# Patient Record
Sex: Female | Born: 1980 | Race: White | Hispanic: No | Marital: Single | Smoking: Former smoker
Health system: Southern US, Community
[De-identification: ages and names within clinical notes are randomized; demographics above are authoritative.]

## PROBLEM LIST (undated history)

## (undated) DIAGNOSIS — A749 Chlamydial infection, unspecified: Secondary | ICD-10-CM

## (undated) DIAGNOSIS — F32A Depression, unspecified: Secondary | ICD-10-CM

## (undated) DIAGNOSIS — R079 Chest pain, unspecified: Secondary | ICD-10-CM

## (undated) DIAGNOSIS — F419 Anxiety disorder, unspecified: Secondary | ICD-10-CM

## (undated) DIAGNOSIS — F329 Major depressive disorder, single episode, unspecified: Secondary | ICD-10-CM

## (undated) DIAGNOSIS — F909 Attention-deficit hyperactivity disorder, unspecified type: Secondary | ICD-10-CM

## (undated) DIAGNOSIS — D649 Anemia, unspecified: Secondary | ICD-10-CM

## (undated) DIAGNOSIS — J45909 Unspecified asthma, uncomplicated: Secondary | ICD-10-CM

## (undated) DIAGNOSIS — R519 Headache, unspecified: Secondary | ICD-10-CM

## (undated) DIAGNOSIS — I10 Essential (primary) hypertension: Secondary | ICD-10-CM

## (undated) DIAGNOSIS — R51 Headache: Secondary | ICD-10-CM

## (undated) HISTORY — DX: Headache, unspecified: R51.9

## (undated) HISTORY — PX: TONSILLECTOMY: SUR1361

## (undated) HISTORY — DX: Unspecified asthma, uncomplicated: J45.909

## (undated) HISTORY — DX: Depression, unspecified: F32.A

## (undated) HISTORY — DX: Headache: R51

## (undated) HISTORY — PX: LYMPHADENECTOMY: SHX5960

## (undated) HISTORY — PX: CERVICAL POLYPECTOMY: SHX88

## (undated) HISTORY — PX: CERVICAL BIOPSY  W/ LOOP ELECTRODE EXCISION: SUR135

## (undated) HISTORY — PX: ADENOIDECTOMY: SUR15

## (undated) HISTORY — DX: Anxiety disorder, unspecified: F41.9

## (undated) HISTORY — PX: APPENDECTOMY: SHX54

## (undated) HISTORY — PX: TUBAL LIGATION: SHX77

## (undated) HISTORY — DX: Major depressive disorder, single episode, unspecified: F32.9

## (undated) HISTORY — PX: DILATION AND CURETTAGE OF UTERUS: SHX78

---
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
## 1999-04-10 ENCOUNTER — Emergency Department (HOSPITAL_COMMUNITY): Admission: EM | Admit: 1999-04-10 | Discharge: 1999-04-10 | Payer: Self-pay | Admitting: Emergency Medicine

## 1999-04-11 ENCOUNTER — Emergency Department (HOSPITAL_COMMUNITY): Admission: EM | Admit: 1999-04-11 | Discharge: 1999-04-11 | Payer: Self-pay | Admitting: Emergency Medicine

## 1999-06-24 ENCOUNTER — Emergency Department (HOSPITAL_COMMUNITY): Admission: EM | Admit: 1999-06-24 | Discharge: 1999-06-24 | Payer: Self-pay | Admitting: Internal Medicine

## 1999-06-24 ENCOUNTER — Encounter: Payer: Self-pay | Admitting: Internal Medicine

## 1999-07-30 ENCOUNTER — Emergency Department (HOSPITAL_COMMUNITY): Admission: EM | Admit: 1999-07-30 | Discharge: 1999-07-30 | Payer: Self-pay | Admitting: Emergency Medicine

## 1999-09-20 ENCOUNTER — Emergency Department (HOSPITAL_COMMUNITY): Admission: EM | Admit: 1999-09-20 | Discharge: 1999-09-20 | Payer: Self-pay | Admitting: Emergency Medicine

## 1999-10-05 ENCOUNTER — Encounter: Payer: Self-pay | Admitting: Emergency Medicine

## 1999-10-05 ENCOUNTER — Emergency Department (HOSPITAL_COMMUNITY): Admission: EM | Admit: 1999-10-05 | Discharge: 1999-10-05 | Payer: Self-pay | Admitting: Emergency Medicine

## 2000-02-04 ENCOUNTER — Emergency Department (HOSPITAL_COMMUNITY): Admission: EM | Admit: 2000-02-04 | Discharge: 2000-02-04 | Payer: Self-pay | Admitting: Emergency Medicine

## 2000-02-07 ENCOUNTER — Emergency Department (HOSPITAL_COMMUNITY): Admission: EM | Admit: 2000-02-07 | Discharge: 2000-02-07 | Payer: Self-pay | Admitting: Emergency Medicine

## 2000-02-12 ENCOUNTER — Emergency Department (HOSPITAL_COMMUNITY): Admission: EM | Admit: 2000-02-12 | Discharge: 2000-02-13 | Payer: Self-pay | Admitting: Emergency Medicine

## 2000-06-09 ENCOUNTER — Emergency Department (HOSPITAL_COMMUNITY): Admission: EM | Admit: 2000-06-09 | Discharge: 2000-06-09 | Payer: Self-pay | Admitting: Emergency Medicine

## 2000-09-15 ENCOUNTER — Emergency Department (HOSPITAL_COMMUNITY): Admission: EM | Admit: 2000-09-15 | Discharge: 2000-09-15 | Payer: Self-pay | Admitting: Emergency Medicine

## 2000-11-24 ENCOUNTER — Inpatient Hospital Stay (HOSPITAL_COMMUNITY): Admission: AD | Admit: 2000-11-24 | Discharge: 2000-11-24 | Payer: Self-pay | Admitting: Obstetrics

## 2000-12-19 ENCOUNTER — Other Ambulatory Visit: Admission: RE | Admit: 2000-12-19 | Discharge: 2000-12-19 | Payer: Self-pay | Admitting: Obstetrics and Gynecology

## 2000-12-26 ENCOUNTER — Inpatient Hospital Stay (HOSPITAL_COMMUNITY): Admission: AD | Admit: 2000-12-26 | Discharge: 2000-12-28 | Payer: Self-pay | Admitting: Obstetrics & Gynecology

## 2000-12-26 ENCOUNTER — Encounter: Payer: Self-pay | Admitting: Obstetrics & Gynecology

## 2001-01-30 ENCOUNTER — Inpatient Hospital Stay (HOSPITAL_COMMUNITY): Admission: AD | Admit: 2001-01-30 | Discharge: 2001-01-30 | Payer: Self-pay | Admitting: Obstetrics and Gynecology

## 2001-02-10 ENCOUNTER — Inpatient Hospital Stay (HOSPITAL_COMMUNITY): Admission: AD | Admit: 2001-02-10 | Discharge: 2001-02-10 | Payer: Self-pay | Admitting: Obstetrics and Gynecology

## 2001-02-23 ENCOUNTER — Ambulatory Visit (HOSPITAL_COMMUNITY): Admission: RE | Admit: 2001-02-23 | Discharge: 2001-02-23 | Payer: Self-pay | Admitting: Obstetrics and Gynecology

## 2001-03-10 ENCOUNTER — Inpatient Hospital Stay (HOSPITAL_COMMUNITY): Admission: AD | Admit: 2001-03-10 | Discharge: 2001-03-10 | Payer: Self-pay | Admitting: Obstetrics & Gynecology

## 2001-03-25 ENCOUNTER — Other Ambulatory Visit: Admission: RE | Admit: 2001-03-25 | Discharge: 2001-03-25 | Payer: Self-pay | Admitting: Obstetrics and Gynecology

## 2001-03-28 ENCOUNTER — Inpatient Hospital Stay (HOSPITAL_COMMUNITY): Admission: AD | Admit: 2001-03-28 | Discharge: 2001-03-28 | Payer: Self-pay | Admitting: Obstetrics and Gynecology

## 2001-04-13 ENCOUNTER — Other Ambulatory Visit: Admission: RE | Admit: 2001-04-13 | Discharge: 2001-04-13 | Payer: Self-pay | Admitting: Obstetrics and Gynecology

## 2001-04-13 ENCOUNTER — Encounter (INDEPENDENT_AMBULATORY_CARE_PROVIDER_SITE_OTHER): Payer: Self-pay

## 2001-04-16 ENCOUNTER — Inpatient Hospital Stay (HOSPITAL_COMMUNITY): Admission: AD | Admit: 2001-04-16 | Discharge: 2001-04-16 | Payer: Self-pay | Admitting: Obstetrics and Gynecology

## 2001-04-18 ENCOUNTER — Inpatient Hospital Stay (HOSPITAL_COMMUNITY): Admission: AD | Admit: 2001-04-18 | Discharge: 2001-04-18 | Payer: Self-pay | Admitting: Obstetrics and Gynecology

## 2001-04-19 ENCOUNTER — Observation Stay (HOSPITAL_COMMUNITY): Admission: AD | Admit: 2001-04-19 | Discharge: 2001-04-19 | Payer: Self-pay | Admitting: Obstetrics and Gynecology

## 2001-04-25 ENCOUNTER — Inpatient Hospital Stay (HOSPITAL_COMMUNITY): Admission: AD | Admit: 2001-04-25 | Discharge: 2001-04-25 | Payer: Self-pay | Admitting: *Deleted

## 2001-05-03 ENCOUNTER — Inpatient Hospital Stay (HOSPITAL_COMMUNITY): Admission: AD | Admit: 2001-05-03 | Discharge: 2001-05-03 | Payer: Self-pay | Admitting: Obstetrics & Gynecology

## 2001-05-10 ENCOUNTER — Inpatient Hospital Stay (HOSPITAL_COMMUNITY): Admission: AD | Admit: 2001-05-10 | Discharge: 2001-05-10 | Payer: Self-pay | Admitting: Obstetrics & Gynecology

## 2001-05-26 ENCOUNTER — Inpatient Hospital Stay (HOSPITAL_COMMUNITY): Admission: AD | Admit: 2001-05-26 | Discharge: 2001-05-26 | Payer: Self-pay | Admitting: Obstetrics & Gynecology

## 2001-05-29 ENCOUNTER — Inpatient Hospital Stay (HOSPITAL_COMMUNITY): Admission: AD | Admit: 2001-05-29 | Discharge: 2001-06-02 | Payer: Self-pay | Admitting: Obstetrics and Gynecology

## 2001-06-30 ENCOUNTER — Other Ambulatory Visit: Admission: RE | Admit: 2001-06-30 | Discharge: 2001-06-30 | Payer: Self-pay | Admitting: Obstetrics and Gynecology

## 2001-08-12 ENCOUNTER — Ambulatory Visit (HOSPITAL_COMMUNITY): Admission: RE | Admit: 2001-08-12 | Discharge: 2001-08-12 | Payer: Self-pay | Admitting: Obstetrics and Gynecology

## 2001-08-12 ENCOUNTER — Encounter (INDEPENDENT_AMBULATORY_CARE_PROVIDER_SITE_OTHER): Payer: Self-pay

## 2001-09-12 ENCOUNTER — Emergency Department (HOSPITAL_COMMUNITY): Admission: EM | Admit: 2001-09-12 | Discharge: 2001-09-12 | Payer: Self-pay | Admitting: Emergency Medicine

## 2001-11-02 ENCOUNTER — Other Ambulatory Visit: Admission: RE | Admit: 2001-11-02 | Discharge: 2001-11-02 | Payer: Self-pay | Admitting: Obstetrics and Gynecology

## 2002-02-03 ENCOUNTER — Emergency Department (HOSPITAL_COMMUNITY): Admission: EM | Admit: 2002-02-03 | Discharge: 2002-02-04 | Payer: Self-pay | Admitting: Emergency Medicine

## 2002-02-03 ENCOUNTER — Encounter: Payer: Self-pay | Admitting: Emergency Medicine

## 2003-05-05 ENCOUNTER — Emergency Department (HOSPITAL_COMMUNITY): Admission: EM | Admit: 2003-05-05 | Discharge: 2003-05-05 | Payer: Self-pay | Admitting: Emergency Medicine

## 2003-07-05 ENCOUNTER — Emergency Department (HOSPITAL_COMMUNITY): Admission: EM | Admit: 2003-07-05 | Discharge: 2003-07-05 | Payer: Self-pay | Admitting: Emergency Medicine

## 2003-09-06 ENCOUNTER — Emergency Department (HOSPITAL_COMMUNITY): Admission: EM | Admit: 2003-09-06 | Discharge: 2003-09-07 | Payer: Self-pay | Admitting: Emergency Medicine

## 2003-09-07 ENCOUNTER — Encounter: Payer: Self-pay | Admitting: Emergency Medicine

## 2004-03-06 ENCOUNTER — Emergency Department (HOSPITAL_COMMUNITY): Admission: EM | Admit: 2004-03-06 | Discharge: 2004-03-06 | Payer: Self-pay | Admitting: Emergency Medicine

## 2004-03-09 ENCOUNTER — Emergency Department (HOSPITAL_COMMUNITY): Admission: EM | Admit: 2004-03-09 | Discharge: 2004-03-10 | Payer: Self-pay | Admitting: Emergency Medicine

## 2004-05-06 ENCOUNTER — Emergency Department (HOSPITAL_COMMUNITY): Admission: EM | Admit: 2004-05-06 | Discharge: 2004-05-07 | Payer: Self-pay | Admitting: Emergency Medicine

## 2004-05-07 ENCOUNTER — Emergency Department (HOSPITAL_COMMUNITY): Admission: EM | Admit: 2004-05-07 | Discharge: 2004-05-08 | Payer: Self-pay | Admitting: Emergency Medicine

## 2004-06-15 ENCOUNTER — Inpatient Hospital Stay (HOSPITAL_COMMUNITY): Admission: AD | Admit: 2004-06-15 | Discharge: 2004-06-15 | Payer: Self-pay | Admitting: Obstetrics and Gynecology

## 2004-07-09 ENCOUNTER — Other Ambulatory Visit: Admission: RE | Admit: 2004-07-09 | Discharge: 2004-07-09 | Payer: Self-pay | Admitting: Obstetrics and Gynecology

## 2004-07-26 ENCOUNTER — Inpatient Hospital Stay (HOSPITAL_COMMUNITY): Admission: AD | Admit: 2004-07-26 | Discharge: 2004-07-26 | Payer: Self-pay | Admitting: Obstetrics and Gynecology

## 2004-09-15 ENCOUNTER — Inpatient Hospital Stay (HOSPITAL_COMMUNITY): Admission: AD | Admit: 2004-09-15 | Discharge: 2004-09-15 | Payer: Self-pay | Admitting: Obstetrics and Gynecology

## 2004-09-29 ENCOUNTER — Inpatient Hospital Stay (HOSPITAL_COMMUNITY): Admission: AD | Admit: 2004-09-29 | Discharge: 2004-09-29 | Payer: Self-pay | Admitting: Obstetrics and Gynecology

## 2004-11-06 ENCOUNTER — Inpatient Hospital Stay (HOSPITAL_COMMUNITY): Admission: AD | Admit: 2004-11-06 | Discharge: 2004-11-06 | Payer: Self-pay | Admitting: Obstetrics and Gynecology

## 2004-11-22 ENCOUNTER — Inpatient Hospital Stay (HOSPITAL_COMMUNITY): Admission: AD | Admit: 2004-11-22 | Discharge: 2004-11-22 | Payer: Self-pay | Admitting: Obstetrics and Gynecology

## 2005-01-07 ENCOUNTER — Inpatient Hospital Stay (HOSPITAL_COMMUNITY): Admission: AD | Admit: 2005-01-07 | Discharge: 2005-01-07 | Payer: Self-pay | Admitting: *Deleted

## 2005-01-14 ENCOUNTER — Encounter (INDEPENDENT_AMBULATORY_CARE_PROVIDER_SITE_OTHER): Payer: Self-pay | Admitting: Specialist

## 2005-01-14 ENCOUNTER — Inpatient Hospital Stay (HOSPITAL_COMMUNITY): Admission: RE | Admit: 2005-01-14 | Discharge: 2005-01-17 | Payer: Self-pay | Admitting: Obstetrics and Gynecology

## 2005-01-18 ENCOUNTER — Encounter: Admission: RE | Admit: 2005-01-18 | Discharge: 2005-02-07 | Payer: Self-pay | Admitting: Obstetrics and Gynecology

## 2005-11-29 ENCOUNTER — Inpatient Hospital Stay (HOSPITAL_COMMUNITY): Admission: AD | Admit: 2005-11-29 | Discharge: 2005-11-29 | Payer: Self-pay | Admitting: *Deleted

## 2006-05-15 ENCOUNTER — Inpatient Hospital Stay (HOSPITAL_COMMUNITY): Admission: AD | Admit: 2006-05-15 | Discharge: 2006-05-16 | Payer: Self-pay | Admitting: Obstetrics and Gynecology

## 2006-06-10 ENCOUNTER — Inpatient Hospital Stay (HOSPITAL_COMMUNITY): Admission: AD | Admit: 2006-06-10 | Discharge: 2006-06-11 | Payer: Self-pay | Admitting: Obstetrics and Gynecology

## 2006-07-03 ENCOUNTER — Inpatient Hospital Stay (HOSPITAL_COMMUNITY): Admission: AD | Admit: 2006-07-03 | Discharge: 2006-07-04 | Payer: Self-pay | Admitting: Obstetrics and Gynecology

## 2006-07-11 ENCOUNTER — Emergency Department (HOSPITAL_COMMUNITY): Admission: EM | Admit: 2006-07-11 | Discharge: 2006-07-11 | Payer: Self-pay | Admitting: Emergency Medicine

## 2006-07-17 ENCOUNTER — Encounter (INDEPENDENT_AMBULATORY_CARE_PROVIDER_SITE_OTHER): Payer: Self-pay | Admitting: Specialist

## 2006-07-17 ENCOUNTER — Inpatient Hospital Stay (HOSPITAL_COMMUNITY): Admission: RE | Admit: 2006-07-17 | Discharge: 2006-07-20 | Payer: Self-pay | Admitting: Obstetrics and Gynecology

## 2006-10-18 ENCOUNTER — Encounter (INDEPENDENT_AMBULATORY_CARE_PROVIDER_SITE_OTHER): Payer: Self-pay | Admitting: Specialist

## 2006-10-18 ENCOUNTER — Inpatient Hospital Stay (HOSPITAL_COMMUNITY): Admission: AD | Admit: 2006-10-18 | Discharge: 2006-10-18 | Payer: Self-pay | Admitting: Obstetrics and Gynecology

## 2007-03-06 ENCOUNTER — Emergency Department (HOSPITAL_COMMUNITY): Admission: EM | Admit: 2007-03-06 | Discharge: 2007-03-06 | Payer: Self-pay | Admitting: Emergency Medicine

## 2007-03-14 ENCOUNTER — Emergency Department (HOSPITAL_COMMUNITY): Admission: EM | Admit: 2007-03-14 | Discharge: 2007-03-14 | Payer: Self-pay | Admitting: Emergency Medicine

## 2007-12-16 ENCOUNTER — Emergency Department (HOSPITAL_COMMUNITY): Admission: EM | Admit: 2007-12-16 | Discharge: 2007-12-16 | Payer: Self-pay | Admitting: Emergency Medicine

## 2008-06-04 ENCOUNTER — Inpatient Hospital Stay (HOSPITAL_COMMUNITY): Admission: AD | Admit: 2008-06-04 | Discharge: 2008-06-04 | Payer: Self-pay | Admitting: Obstetrics and Gynecology

## 2008-07-28 ENCOUNTER — Inpatient Hospital Stay (HOSPITAL_COMMUNITY): Admission: AD | Admit: 2008-07-28 | Discharge: 2008-07-29 | Payer: Self-pay | Admitting: Obstetrics and Gynecology

## 2008-07-31 ENCOUNTER — Inpatient Hospital Stay (HOSPITAL_COMMUNITY): Admission: AD | Admit: 2008-07-31 | Discharge: 2008-07-31 | Payer: Self-pay | Admitting: Obstetrics and Gynecology

## 2008-09-18 ENCOUNTER — Inpatient Hospital Stay (HOSPITAL_COMMUNITY): Admission: AD | Admit: 2008-09-18 | Discharge: 2008-09-19 | Payer: Self-pay | Admitting: Obstetrics and Gynecology

## 2008-12-07 ENCOUNTER — Inpatient Hospital Stay (HOSPITAL_COMMUNITY): Admission: AD | Admit: 2008-12-07 | Discharge: 2008-12-08 | Payer: Self-pay | Admitting: Obstetrics and Gynecology

## 2008-12-08 ENCOUNTER — Encounter (INDEPENDENT_AMBULATORY_CARE_PROVIDER_SITE_OTHER): Payer: Self-pay | Admitting: Obstetrics and Gynecology

## 2008-12-08 ENCOUNTER — Inpatient Hospital Stay (HOSPITAL_COMMUNITY): Admission: AD | Admit: 2008-12-08 | Discharge: 2008-12-10 | Payer: Self-pay | Admitting: Obstetrics and Gynecology

## 2009-03-23 ENCOUNTER — Emergency Department (HOSPITAL_COMMUNITY): Admission: EM | Admit: 2009-03-23 | Discharge: 2009-03-23 | Payer: Self-pay | Admitting: Emergency Medicine

## 2009-07-03 ENCOUNTER — Emergency Department (HOSPITAL_COMMUNITY): Admission: EM | Admit: 2009-07-03 | Discharge: 2009-07-04 | Payer: Self-pay | Admitting: Emergency Medicine

## 2009-11-16 ENCOUNTER — Emergency Department (HOSPITAL_COMMUNITY): Admission: EM | Admit: 2009-11-16 | Discharge: 2009-11-16 | Payer: Self-pay | Admitting: Emergency Medicine

## 2011-02-19 ENCOUNTER — Emergency Department (HOSPITAL_COMMUNITY)
Admission: EM | Admit: 2011-02-19 | Discharge: 2011-02-19 | Disposition: A | Discharge status: Still In Hospital | Payer: Self-pay | Attending: Emergency Medicine | Admitting: Emergency Medicine

## 2011-02-25 LAB — POCT I-STAT, CHEM 8
Calcium, Ion: 1.05 mmol/L — ABNORMAL LOW (ref 1.12–1.32)
Chloride: 108 mEq/L (ref 96–112)
Creatinine, Ser: 0.7 mg/dL (ref 0.4–1.2)
Glucose, Bld: 103 mg/dL — ABNORMAL HIGH (ref 70–99)
Hemoglobin: 13.6 g/dL (ref 12.0–15.0)

## 2011-03-11 ENCOUNTER — Emergency Department (HOSPITAL_COMMUNITY)
Admission: EM | Admit: 2011-03-11 | Discharge: 2011-03-11 | Disposition: A | Discharge status: Home / Self Care | Payer: Medicaid Other | Attending: Emergency Medicine | Admitting: Emergency Medicine

## 2011-03-11 DIAGNOSIS — K029 Dental caries, unspecified: Secondary | ICD-10-CM | POA: Insufficient documentation

## 2011-03-11 DIAGNOSIS — K089 Disorder of teeth and supporting structures, unspecified: Secondary | ICD-10-CM | POA: Insufficient documentation

## 2011-03-11 LAB — RH IMMUNE GLOB WKUP(>/=20WKS)(NOT WOMEN'S HOSP)

## 2011-03-11 LAB — CBC
HCT: 23.1 % — ABNORMAL LOW (ref 36.0–46.0)
HCT: 31.1 % — ABNORMAL LOW (ref 36.0–46.0)
MCV: 68.6 fL — ABNORMAL LOW (ref 78.0–100.0)
Platelets: 126 10*3/uL — ABNORMAL LOW (ref 150–400)
RDW: 16 % — ABNORMAL HIGH (ref 11.5–15.5)
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
## 2011-04-09 NOTE — Op Note (Signed)
NAME:  Crystal Myers, Crystal Myers NO.:  [id number]   MEDICAL RECORD NO.:  [id number]          PATIENT TYPE:  INP   LOCATION:  9146                          [facility]   PHYSICIAN:  Malachi Pro. Ambrose Mantle, M.D. DATE OF BIRTH:  [birth year]   DATE OF PROCEDURE:  12/08/2008  DATE OF DISCHARGE:                               OPERATIVE REPORT   PREOPERATIVE DIAGNOSES:  1. Intrauterine pregnancy at 37 weeks 3 days.  2. Early labor.  3. Prior cesarean section x3.  4. Voluntary sterilization.   POSTOPERATIVE DIAGNOSES:  1. Intrauterine pregnancy at 37 weeks 3 days.  2. Early labor.  3. Prior cesarean section x3.  4. Voluntary sterilization.   OPERATION:  1. Low-transverse cervical C-section.  2. Bilateral tubal ligation.   OPERATOR:  Malachi Pro. Ambrose Mantle, MD   ASSISTANT:  Sherron Monday, MD   ANESTHESIA:  Spinal anesthesia.   The patient was brought to the operating room and we went to the  operating room approximately 40 minutes prior to the start of the case  because it was a very difficult IV to start and after she got arrived,  she had to be loaded with 1000 mL of fluid.  After the spinal anesthetic  by Dr. Arby Barrette was effective, the patient was placed in the left  lateral position in frog-leg.  The abdomen was prepped with Betadine  solution.  The urethra was prepped.  The Foley catheter was inserted to  straight drain.  The patient was then placed supine.  The abdomen was  draped as a sterile field.  Anesthesia was confirmed with an Allis  clamp.  A transverse incision was made through the old incision and  carried in layers through the skin and subcutaneous tissue and fascia.  The fascia was somewhat scarred.  I then incised the fascia  transversely, separated it from the rectus muscles superiorly and  inferiorly, more superiorly because the incision was fairly low.  I then  made an entry into the abdominal cavity with my finger and opened the  incision vertically.  I  inspected the lower uterine segment.  It was  free of any significant adhesions.  I made an incision into the lower  uterine segment and then went to the rest of the way through the  myometrium and into the amniotic sac with my finger.  A loop of cord  popped out along with the vertex presenting.  I enlarged the incision by  pulling superiorly and inferiorly.  I then tried to elevate the vertex  into the incisional opening and deliver it, but it did not seem to  deliver and needed a little more room, so I cut the left rectus muscle  and with this, the baby easily delivered.  The nose and pharynx were  suctioned with the bulb and then the rest of the baby was delivered.  The cord was clamped.  The infant was given to Dr. Rennis Golden, who was  in attendance.  He assigned the 7 pounds 5 ounces female infant Apgars of  9 at 1 at 9 at 5 minutes.  The placenta was removed intact.  All  membranes were removed.  The cervix was dilated with ring forceps even  though it was already 2 cm dilated.  The inside of the uterus was  inspected and found to be free of any debris.  Both tubes and ovaries  appeared completely normal.  The uterine incision was closed with 2  running sutures of 0 Vicryl locking the first layer, nonlocking on the  second layer.  I then created a window in the mesosalpinx bilaterally,  placed 2 ties of 0 plain catgut proximally and distally on each tube,  cut the intervening segment of tube, and preserved it for pathology.  There was no bleeding.  Liberal irrigation confirmed hemostasis.  Reinspection of the uterine incision revealed it was not bleeding and so  the abdominal wall was then closed.  First of all, I closed the rectus  muscle reapproximating it with 3 figure-of-8 sutures of 0 Vicryl.  Then,  I closed the abdominal wall by suturing the peritoneum and rectus muscle  together in one layer and then closed the fascia with 2 running sutures  of 0 Vicryl, the subcu with a  running 3-0 Vicryl, and the skin was  closed with automatic staples.  The patient seemed to tolerate the  procedure well.  Blood loss was about 1000 mL.  Sponge and needle counts  were correct and she was returned to recovery in satisfactory condition.      Malachi Pro. Ambrose Mantle, M.D.  Electronically Signed     TFH/MEDQ  D:  12/08/2008  T:  12/09/2008  Job:  [id number]
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
## 2011-04-12 NOTE — Op Note (Signed)
NAME:  Crystal Myers, Crystal Myers NO.:  [id number]   MEDICAL RECORD NO.:  [id number]          PATIENT TYPE:  INP   LOCATION:  9132                          [facility]   PHYSICIAN:  Malachi Pro. Ambrose Mantle, M.D. DATE OF BIRTH:  1981-08-17   DATE OF PROCEDURE:  07/17/2006  DATE OF DISCHARGE:                                 OPERATIVE REPORT   PREOPERATIVE DIAGNOSES:  1. Intrauterine pregnancy at 39 weeks.  2. Prior C-section x2.   POSTOPERATIVE DIAGNOSES:  1. Intrauterine pregnancy at 39 weeks.  2. Prior C-section x2.  3. Omental adhesions to the abdominal wall.   OPERATION:  Low-transverse cervical C-section, division of adhesions.   OPERATOR:  Malachi Pro. Ambrose Mantle, M.D.   ASSISTANT:  Zenaida Niece, M.D.   ANESTHESIA:  Spinal anesthesia.   The patient was brought to the operating room and placed under satisfactory  spinal anesthesia by Dr. Malen Gauze.  She was placed in the left lateral tilt  position.  The abdomen was prepped with Betadine solution.  The urethra was  prepped and the catheter was inserted to straight drain.  The abdomen was  then draped as a sterile field.  The incision was quite low, but it felt as  if I could use that as the entry to the abdominal cavity.  I confirmed  anesthesia with an Allis clamp, made the incision through the old incisions,  carried in layers through the skin and subcutaneous tissue, got down to the  fascia.  The fascia was not very well defined.  It seemed to have been  combined with the muscle to a certain extent.  We did divide this  combination of tissues transversely, separated the fascia from the rectus  muscles superiorly, not as much inferiorly, then created a midline by  cutting through the midline raphe, entered the abdominal cavity, enlarged  the incision superiorly and inferiorly and then stretched it, identified the  lower uterine segment, confirmed the presence of the fetal vertex below the  incision, made the incision  through the part of the myometrium, entered the  amniotic sac with my finger through the rest of the myometrium and amniotic  sac.  Clear fluid was obtained.  The incision was enlarged transversely with  bandage scissors, cutting in a slightly upward and outward direction.  Delivered the head through the incisional opening, suctioned the nose and  mouth with the bulb and then delivered the rest of the body with fundal  pressure.  The cord was clamped.  The infant was given to Dr. Venetia Constable, who  was in attendance.  It was an 8 pound 1 ounce female without Apgars of 9 at  1 and 9 and 5 minutes.  Cord blood was obtained a small segment was obtained  in case a pH was necessary.  Routine cord blood studies were obtained with  approximately 6 mL and then the remainder of the cord was preserved with the  placenta for the cord blood donation if possible.  The membranes were  removed.  The inside of the uterus was palpated and found to be  free of any  debris.  Both tubes and ovaries appeared normal.  We closed the uterine  incision with two running sutures of 0 Vicryl, locking on the first layer,  nonlocking on the second layer, remaining well free of the bladder at all  times.  Hemostasis was adequate.  There were a couple of omental adhesions  adherent to the anterior abdominal wall.  These were divided with the Bovie  and this left the abdominal wall completely free of any omentum.  I then  closed the abdominal wall by interrupted sutures of 0 Vicryl on the rectus  muscle including some of the peritoneum with the rectus muscle.  After  hemostasis was deemed adequate and liberal irrigation had confirmed it.  The  fascia, as well as could be, was closed with two running sutures of 0  Vicryl.  Some of muscle had to be included with the fascia because the  fascia was not especially well-defined.  This had been noted on her prior C-  section also.  The subcu tissue was closed with a running 3-0 Vicryl  and the  skin was reapproximated with interrupted staples.  The patient seemed to  tolerate the procedure well.  The anesthetist estimated the blood loss at  600 mL.  Sponge and needle counts were correct and she was returned to  recovery in satisfactory condition.      Malachi Pro. Ambrose Mantle, M.D.  Electronically Signed     TFH/MEDQ  D:  07/17/2006  T:  07/17/2006  Job:  [id number]

## 2011-04-12 NOTE — Op Note (Signed)
[facility]  Patient:    MARCIEL, OFFENBERGER Visit Number: [id number] MRN: [id number]          Service Type: DSU Location: [facility] Attending Physician:  Miguel Aschoff Dictated by:   Miguel Aschoff, M.D. Proc. Date: 08/12/01 Admit Date:  08/12/2001                             Operative Report  PREOPERATIVE DIAGNOSIS:       Cervical intraepithelial neoplasia-3.  POSTOPERATIVE DIAGNOSIS:      Cervical intraepithelial neoplasia-3.  PROCEDURE:                    Loop electrosurgical excision procedure                               with endocervical curettage.  SURGEON:                      Miguel Aschoff, M.D.  ANESTHESIA:                   IV sedation with paracervical block.  COMPLICATIONS:                None.  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 She presents now to undergo a LEEP procedure for both diagnostic and therapeutic purposes. The risks and benefits of the procedure have been discussed with the patient and informed consent has been obtained.  DESCRIPTION OF PROCEDURE:     The patient was taken to the operating room, placed in a supine position, and IV sedation was administered without difficulty. She was then placed in a dorsal lithotomy position, prepped and draped in the usual sterile fashion. After this was done, the vaginal vault was cleansed with acetic acid and then the cervix was stained with Lugols solution. There did not appear to be any gross or obvious lesion noted on the cervix and there was no area of poor Lugols uptake noted. At this point, using a large electric loop with 90 watts of cutting power, a cone of tissue was cut from the cervix, tagged at the 12 oclock position with a white pin, and the specimen placed in saline. Additional specimens were taken and placed with the original cone of tissue and their anatomic sites tagged with pins. Then, the  residual portion of the endocervical canal was curetted using a small Kevorkian curet and this endocervical curettage specimen was sent separately for histologic study. At this point, the patients cone biopsy site was cauterized with electrocautery and hemostasis was readily achieved. At this point, the procedure was completed. The patient was taken out of lithotomy position and brought to the recovery room in satisfactory condition. The estimated blood loss was approximately 20 cc.  DISPOSITION:                  Plans are for the patient to be discharged home. Medications for home include Darvocet-N 100 one every four to six hours as needed for pain and doxycycline one twice a day x three days. The patient is to call for pathology report on August 14, 2001. She is to call in there are problems such as fever, pain, or heavy  bleeding. Dictated by:   Miguel Aschoff, M.D. Attending Physician:  Miguel Aschoff DD:  08/12/01 TD:  08/12/01 Job: [id number]

## 2011-04-12 NOTE — Op Note (Signed)
[facility]  Patient:    Crystal Myers, Crystal Myers                         MRN: [id number] Proc. Date: 05/29/01 Adm. Date:  [id number] Attending:  Marcelle Overlie                           Operative Report  PREOPERATIVE DIAGNOSIS:       Intrauterine pregnancy at term with arrest of active stage of labor.  POSTOPERATIVE DIAGNOSIS:      Intrauterine pregnancy at term with arrest of active stage of labor, with delivery of viable female infant, Apgars 8 and 9.  OPERATION/PROCEDURE:          Primary low transverse cesarean section.  SURGEON:                      Miguel Aschoff, M.D.  ANESTHESIA:                   Epidural.  COMPLICATIONS:                None.  INDICATIONS FOR PROCEDURE:    The patient is a 30 year old white female, gravida 2 para 0, 0-1-0, with an estimated date of confinement of June 05, 2001.  The patient was admitted to [facility] in early labor and progressed to 5 cm after artificial rupture of membranes.  She was 5 cm at 3:30 p.m. and an intrauterine pressure catheter was placed to monitor the quality of her labor.  She had no further dilatation for over four hours. In view of this arrest of cervical dilatation she is being taken now to undergo primary cesarean section.  DESCRIPTION OF PROCEDURE:     The patient was taken to the operating room and placed in the supine position, and a satisfactory level of epidural anesthesia was achieved without difficulty.  A Foley catheter had been previously placed. After this was done, and after being prepped and draped in the usual sterile fashion, a Pfannenstiel incision was made and extended down through the subcutaneous tissue, with bleeding points being clamped and coagulated as they were encountered.  The fascia was then identified and incised transversely, and then separated from the underlying rectus muscles.  The rectus muscles were divided in the midline and the peritoneum then  identified and entered with care to avoid underlying structures.  The peritoneal incision was then extended under direct visualization.  The bladder flap was then created and protected with a bladder blade.  Then, an elliptical transverse incision was made into the lower uterine segment.  The amniotic cavity was entered and clear fluid was obtained.  At this point the patient was delivered of a viable female infant, Apgars 8 at one minute and 9 at five minutes, from a vertex ROA position.  The nose and mouth were suctioned, the cord doubly clamped, a nuchal cord x 3 noted and removed without difficulty.  Cord bloods were then obtained for appropriate studies and the placenta delivered.  The uterus was evacuated of any remaining products of conception.  The angles of the uterine incision were then identified and ligated using figure-of-eight sutures of #1 Vicryl and then the uterus was closed in layers.  The first layer was a running interlocking suture of #1 Vicryl followed by an imbricating suture of #1 Vicryl.  The bladder flap was then  reapproximated using running continuous 2-0 Vicryl suture.  At this point the abdomen was irrigated with warm saline. Laboratory counts were taken and found to be correct, and then the abdomen was closed.  The parietal peritoneum was closed using running continuous 0 Vicryl suture, the fascia closed using two sutures of 0 Vicryl - each starting at the lateral fascial angles and meeting in the midline.  The subcutaneous tissue was closed using interrupted 0 Vicryl sutures and then the skin incision closed using staples.  Estimated blood loss was approximately 600 cc. The patient tolerated the procedure well and went to the recovery room in satisfactory condition.  The baby was taken to the nursery in satisfactory condition. DD:  05/29/01 TD:  05/30/01 Job: [id number]

## 2011-04-12 NOTE — Discharge Summary (Signed)
[facility]  Patient:    Crystal Myers, Crystal Myers                     MRN: [id number] Adm. Date:  [id number] Disc. Date: [id number] Attending:  Mickle Mallory Dictator:   Leilani Able, P.A.                           Discharge Summary  FINAL DIAGNOSES:              Intrauterine pregnancy at about [redacted] weeks gestation, viral syndrome.  HISTORY:                      This 30 year old G2, P0 presents around 16-[redacted] weeks gestation with a temperature, nausea, vomiting, aches, headache, and lower abdominal pain.  Patient did have a history of a second trimester pregnancy loss which showed chorioamnionitis and of course was delivered at that time.  Patients temperature was about 101 upon admission.  Her uterus measured from the umbilicus about -3.  She did have positive fetal heart tones with the Doppler.  Patient was admitted to be watched and for ultrasound. Patients white blood cell count was normal.  By hospital day #3 she was feeling much better.  She was felt ready for discharge with the diagnosis of a viral syndrome.  She was sent home on a regular diet.  Told to decrease activities.  Told to continue prenatal vitamins, to rest, and to call with any increase in her fever, chills, or increase in her pain. DD:  01/21/01 TD:  01/21/01 Job: [id number]

## 2011-04-12 NOTE — Discharge Summary (Signed)
NAME:  Crystal Myers, Crystal Myers NO.:  [id number]   MEDICAL RECORD NO.:  [id number]          PATIENT TYPE:  INP   LOCATION:  9120                          [facility]ACILITY:  [facility]   PHYSICIAN:  Malachi Pro. Ambrose Mantle, M.D. DATE OF BIRTH:  1981-06-10   DATE OF ADMISSION:  01/14/2005  DATE OF DISCHARGE:  01/17/2005                                 DISCHARGE SUMMARY   A 30 year old white female admitted for repeat C section. Blood group and  type A negative with a negative antibody, nonreactive serology, rubella  equivocal. Hepatitis B surface antigen negative, HIV negative, GC negative,  chlamydia positive treated with azithromycin, repeat negative, one hour  Glucola 113, group B strep positive. The patient underwent a low transverse  cervical C section by Dr. Ambrose Mantle with Dr. Senaida Ores assisting with delivery  of a female infant 8 pounds 15 ounces, Apgar's of 9 at 1 and 9 at 5 minutes,  blood loss about 1000 mL. Postoperatively, the patient did well and was  discharged on the third postoperative day voiding well, ambulating well,  passing flatus, had a bowel movement, tolerating a regular diet.  Staples  removed, strips were applied and on the third postoperative day was  discharged. The patient did receive RhoGAM.  Initial hemoglobin 10.1,  followup 8.0. White count 8500.  Hematocrit 31.4, followup 24.3, platelet  count 262,000. Urinalysis was negative. Complete metabolic profile was  essentially normal. RPR nonreactive.   FINAL DIAGNOSES:  Intrauterine pregnancy 39 weeks, delivered vertex by  repeat C section, declined vaginal birth after cesarean section.   OPERATION:  Low transverse cervical C section.   FINAL CONDITION:  Improved.   DISCHARGE INSTRUCTIONS:  Regular discharge instruction booklet, Darvocet-N  100, 24 tablets with one refill, 1 every 4-6h. as needed for pain. The  patient is advised to return in 10-14 days. She will receive the rubella  vaccine prior to  discharge.      TFH/MEDQ  D:  01/17/2005  T:  01/17/2005  Job:  [id number]

## 2011-04-12 NOTE — Discharge Summary (Signed)
NAME:  Crystal Myers, Crystal Myers NO.:  [id number]   MEDICAL RECORD NO.:  [id number]          PATIENT TYPE:  INP   LOCATION:  9132                          [facility]   PHYSICIAN:  Huel Cote, M.D. DATE OF BIRTH:  Apr 13, 1981   DATE OF ADMISSION:  07/17/2006  DATE OF DISCHARGE:  07/20/2006                                 DISCHARGE SUMMARY   DISCHARGE DIAGNOSES:  1. Term pregnancy a 39 weeks, delivered.  2. Prior cesarean section times two declining trial of labor.  3. Status post repeat low-transverse cesarean section; and,  4. Lysis of adhesions.   DISCHARGE MEDICATIONS:  1. Motrin 600 mg p.o. every six hours.  2. Darvocet 1-2 tablets p.o. every four hours p.r.n.   FOLLOW UP:  The patient is to follow up in two weeks for an incision check  with Dr. Ambrose Mantle.   HOSPITAL COURSE:  Briefly, the patient is a 30 year old G 4, P 2, 0, 1, 2  who came in for a scheduled repeat cesarean section at 39 weeks.  The  patient had two prior cesarean sections and declined a trial of labor.  She  began her prenatal care at our office at [redacted] weeks gestation.  She did have  an anti-Lewis A antibody with an A negative blood type; however, as this  would have no effect on the pregnancy it was just followed.  Her prenatal  care was otherwise uneventful.   For her full medical history and obstetrical history please see her  previously dictated history and physical.   On July 17, 2006 the underwent a repeat low-transverse cesarean section  with some division of adhesions, which were noted to be going to the  abdominal wall.  She was delivered of a viable female infant with Apgars of  9 and 9, with weight of 8 pounds 1 ounce.  She had initially requested tubal  ligation,  but declined this at the time of surgery.  She was then admitted  for routine postoperative care and did well.   On postop day number one her hemoglobin was 8.6 down from 9.9.  She was  ambulating well and was able  to tolerate a regular diet.   By postop day number three the patient was ambulating and tolerating  a  regular diet.  Her pain was well controlled with Darvocet.  Her incision was  clear and Steri-strips were placed.  The patient was instructed to follow up  in the office in two weeks for an incision check.      Huel Cote, M.D.  Electronically Signed    KR/MEDQ  D:  08/12/2006  T:  08/13/2006  Job:  [id number]
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
## 2011-04-12 NOTE — Discharge Summary (Signed)
NAME:  Crystal Myers, Crystal Myers NO.:  [id number]   MEDICAL RECORD NO.:  [id number]          PATIENT TYPE:  INP   LOCATION:  9146                          [facility]   PHYSICIAN:  Zenaida Niece, M.D.DATE OF BIRTH:  [birth year]   DATE OF ADMISSION:  12/08/2008  DATE OF DISCHARGE:  12/10/2008                               DISCHARGE SUMMARY   ADMISSION DIAGNOSES:  1. Intrauterine pregnancy at 37 weeks.  2. Prior cesarean section x3.  3. Desires surgical sterility.   DISCHARGE DIAGNOSES:  1. Intrauterine pregnancy at 37 weeks.  2. Prior cesarean section x3.  3. Desires surgical sterility.   PROCEDURES:  On December 08, 2008, Dr. Ambrose Mantle performed repeat low  transverse cesarean section and bilateral partial salpingectomy.   HISTORY AND PHYSICAL:  Please see chart for full history and physical.  Briefly, this is a 30 year old gravida 5, para 3-1-0-3 with an EGA of [redacted]  weeks, who is admitted for repeat cesarean section due to cervical  change, painful contractions, and a history of 3 prior cesarean  sections.  Again, please see the chart for the remainder of her history  and physical.  Physical exam significant for weight of 226 pounds.  Abdomen is gravid, nontender.  Cervix is 2, 50 and -2 and she has signed  BTL papers.   HOSPITAL COURSE:  The patient is admitted on December 08, 2008, and taken  to the operating room by Dr. Ambrose Mantle for repeat cesarean section and  bilateral tubal ligation.  This was done under spinal anesthesia with an  estimated blood loss 1000 mL and no apparent complications.  Postoperatively, she had no significant complications.  Predelivery  hemoglobin is 9.9.  Postdelivery is 7.5.  On postoperative #2, the  patient is doing well and request to discharge home.  At that time, she  was felt to be stable enough for discharge home.   DISCHARGE INSTRUCTIONS:  Regular diet, pelvic rest, and no strenuous  activity.  Follow up is in 3-4 days for  staple removal.  Medications are  Darvocet-N 100, #30 one to two p.o. q. 4-6 hours p.r.n. pain, over-the-  counter ibuprofen as needed, and over-the-counter iron sulfate b.i.d.  and she is given our discharge pamphlet.      Zenaida Niece, M.D.  Electronically Signed     TDM/MEDQ  D:  01/23/2009  T:  01/23/2009  Job:  [id number]

## 2011-04-12 NOTE — Discharge Summary (Signed)
[facility]  Patient:    Crystal Myers, Crystal Myers                         MRN: [id number] Adm. Date:  [id number] Disc. Date: [id number] Attending:  Marcelle Overlie Dictator:   Leilani Able, P.A.                           Discharge Summary  FINAL DIAGNOSES:              Intrauterine pregnancy at term with arrest of active stage of labor.  PROCEDURE:                    Primary low transverse cesarean section.  SURGEON:                      Dr. Miguel Aschoff.  COMPLICATIONS:                None.  HISTORY:                      This 30 year old G2, P0-0-1-0 presents at term in early labor.  Patient progressed about 5 cm with dilation.  At this point AROM was performed and patient had intrauterine pressure catheter placed to monitor her labor.  There was no further dilation for over four hours and in view of this arrest of cervical dilation, patient was taken to the operating room to undergo a cesarean section.  Patient was taken to the operating room on May 29, 2001 by Dr. Miguel Aschoff where a primary low transverse cesarean section was performed with the delivery of a 7 pound 6 ounce female infant with Apgars of 8 and 9.  Delivery went without complications.  Patients postoperative course was complicated by some low grade temperatures.  She was kept in the hospital until postoperative day #4.  She did not have any temperature and she was feeling a lot better.  She was sent home on a regular diet.  Told to decrease activities.  Was given a prescription for Darvocet and Advil.  Told to follow-up in the office in four to six weeks. DD:  06/18/01 TD:  06/18/01 Job: [id number]

## 2011-04-12 NOTE — Op Note (Signed)
NAME:  Crystal Myers, Crystal Myers NO.:  [id number]   MEDICAL RECORD NO.:  [id number]          PATIENT TYPE:  INP   LOCATION:  9198                          [facility]   PHYSICIAN:  Malachi Pro. Ambrose Mantle, M.D. DATE OF BIRTH:  01-10-1981   DATE OF PROCEDURE:  01/14/2005  DATE OF DISCHARGE:                                 OPERATIVE REPORT   PREOPERATIVE DIAGNOSES:  1.  Intrauterine pregnancy at 39 weeks.  2.  Prior cesarean section, declined vaginal birth after cesarean delivery.   POSTOPERATIVE DIAGNOSES:  1.  Intrauterine pregnancy at 39 weeks.  2.  Prior cesarean section, declined vaginal birth after cesarean delivery.   OPERATION:  Low transverse cervical cesarean section.   OPERATOR:  Malachi Pro. Ambrose Mantle, M.D.   ASSISTANT:  Huel Cote, M.D.   Spinal anesthesia.   The patient was brought to the operating room.  The heart rate had been  monitored for approximately 30-60 minutes in the preop area, completely  normal.  She was given a spinal anesthetic by Dr. Pamalee Leyden, placed in the left  lateral tilt position.  The abdomen and the urethra were prepped with  Betadine solution.  A Foley catheter was inserted to straight drain.  The  abdomen was then draped as a sterile field.  The old incision was quite low.  I still felt like I could remove the baby through it, so I made incision  through the old incision and carried it in layers through the skin,  subcutaneous tissue and fascia.  The fascia seemed to be quite scarred to  the muscle below.  I dissected the fascia away from the muscle as well as I  could both superiorly and inferiorly, and then the muscle was cut in the  midline.  The peritoneum was opened vertically and stretched.  There was  significant scarring, as I stated, between the fascia and the muscle.  I  incised the lower uterine segment peritoneum, extended it laterally, then  pushed the bladder inferiorly and retracted the bladder away.  I made an  incision into the lower uterine segment, extended it laterally with the  bandage scissors, and then additionally by pulling superiorly and inferiorly  on the uterine incision.  I ruptured the membranes and found clear fluid.  The cord was presenting in front of the fetal vertex.  With significant  fundal pressure, we were able to deliver the baby through the incisional  opening.  The nose and pharynx were suctioned with the bulb, and the  remainder of the baby was delivered.  The cord was clamped and the infant  was given to Dr. Mikle Bosworth, who was in attendance.  She assigned the 8 pound 15  ounce infant Apgars of 9 at one and 9 at five minutes.  A segment of cord  was preserved in case a pH was necessary.  Routine cord blood studies were  obtained.  The placenta was removed completely intact.  The inside of the  uterus felt normal.  The cervix was dilated with a ring forceps.  Both tubes  and ovaries and the  uterus appeared normal.  I closed the uterine incision  with two running sutures of 0 Vicryl, locking the first layer and  imbricating the first layer with a second running suture.  Visualization  confirmed hemostasis.  Both gutters were blotted free of blood, and then the  abdominal wall was closed with interrupted sutures on the rectus muscle.  Some of the rectus muscle sutures also included some peritoneum.  I then  tried to establish the fascial planes and close the fascia the best I could  with two running sutures of 0 Vicryl.  I may have included additional muscle  and additional subcutaneous tissue with the fascia in order to get  the closure done.  The subcu tissue was then closed with running 3-0 Vicryl  and the skin was closed with automatic staples.  The patient seemed to  tolerate the procedure well.  Blood loss was about 1000 mL.  Sponge and  needle counts were correct.  She was returned to recovery in stable  condition.      TFH/MEDQ  D:  01/14/2005  T:  01/14/2005  Job:   [id number]

## 2011-08-26 LAB — URINALYSIS, ROUTINE W REFLEX MICROSCOPIC
Bilirubin Urine: NEGATIVE
Glucose, UA: NEGATIVE
Ketones, ur: NEGATIVE
Protein, ur: NEGATIVE
Urobilinogen, UA: 0.2

## 2011-08-26 LAB — CBC
Hemoglobin: 8.7 — ABNORMAL LOW
Platelets: 141 — ABNORMAL LOW
RBC: 4.03
WBC: 7.8

## 2011-08-26 LAB — URINE CULTURE: Colony Count: >=100000

## 2011-08-26 LAB — URINE MICROSCOPIC-ADD ON

## 2011-09-03 ENCOUNTER — Emergency Department (HOSPITAL_COMMUNITY)
Admission: EM | Admit: 2011-09-03 | Discharge: 2011-09-03 | Discharge status: Against Medical Advice | Payer: Self-pay | Attending: Emergency Medicine | Admitting: Emergency Medicine

## 2011-10-07 ENCOUNTER — Emergency Department (HOSPITAL_COMMUNITY): Payer: Medicaid Other

## 2011-10-07 ENCOUNTER — Emergency Department (HOSPITAL_COMMUNITY)
Admission: EM | Admit: 2011-10-07 | Discharge: 2011-10-08 | Disposition: A | Discharge status: Home / Self Care | Payer: Medicaid Other | Attending: Emergency Medicine | Admitting: Emergency Medicine

## 2011-10-07 DIAGNOSIS — M545 Low back pain, unspecified: Secondary | ICD-10-CM | POA: Insufficient documentation

## 2011-10-07 DIAGNOSIS — R05 Cough: Secondary | ICD-10-CM | POA: Insufficient documentation

## 2011-10-07 DIAGNOSIS — R6889 Other general symptoms and signs: Secondary | ICD-10-CM | POA: Insufficient documentation

## 2011-10-07 DIAGNOSIS — R1110 Vomiting, unspecified: Secondary | ICD-10-CM | POA: Insufficient documentation

## 2011-10-07 DIAGNOSIS — R059 Cough, unspecified: Secondary | ICD-10-CM | POA: Insufficient documentation

## 2011-10-07 DIAGNOSIS — N12 Tubulo-interstitial nephritis, not specified as acute or chronic: Secondary | ICD-10-CM | POA: Insufficient documentation

## 2011-10-07 DIAGNOSIS — R109 Unspecified abdominal pain: Secondary | ICD-10-CM | POA: Insufficient documentation

## 2011-10-07 DIAGNOSIS — R509 Fever, unspecified: Secondary | ICD-10-CM | POA: Insufficient documentation

## 2011-10-07 HISTORY — DX: Anemia, unspecified: D64.9

## 2011-10-07 LAB — COMPREHENSIVE METABOLIC PANEL
Albumin: 4 g/dL (ref 3.5–5.2)
BUN: 6 mg/dL (ref 6–23)
Calcium: 9.1 mg/dL (ref 8.4–10.5)
Creatinine, Ser: 0.67 mg/dL (ref 0.50–1.10)
GFR calc Af Amer: >90 mL/min (ref >90)
Glucose, Bld: 102 mg/dL — ABNORMAL HIGH (ref 70–99)
Total Protein: 7.3 g/dL (ref 6.0–8.3)

## 2011-10-07 LAB — DIFFERENTIAL
Basophils Relative: 0 % (ref 0–1)
Eosinophils Absolute: 0.1 10*3/uL (ref 0.0–0.7)
Eosinophils Relative: 2 % (ref 0–5)
Lymphs Abs: 0.5 10*3/uL — ABNORMAL LOW (ref 0.7–4.0)
Monocytes Relative: 5 % (ref 3–12)

## 2011-10-07 LAB — URINALYSIS, ROUTINE W REFLEX MICROSCOPIC
Ketones, ur: NEGATIVE mg/dL
Nitrite: NEGATIVE
Specific Gravity, Urine: 1.018 (ref 1.005–1.030)
pH: 6.5 (ref 5.0–8.0)

## 2011-10-07 LAB — CBC
Hemoglobin: 11.5 g/dL — ABNORMAL LOW (ref 12.0–15.0)
MCH: 19.8 pg — ABNORMAL LOW (ref 26.0–34.0)
MCHC: 32.7 g/dL (ref 30.0–36.0)
MCV: 60.7 fL — ABNORMAL LOW (ref 78.0–100.0)
RBC: 5.8 MIL/uL — ABNORMAL HIGH (ref 3.87–5.11)

## 2011-10-07 LAB — URINE MICROSCOPIC-ADD ON

## 2011-10-07 LAB — POCT PREGNANCY, URINE: Preg Test, Ur: NEGATIVE

## 2011-10-07 MED ORDER — DEXTROSE 5 % IV SOLN
1.0000 g | INTRAVENOUS | Status: DC
  Administered 2011-10-07: 1 g via INTRAVENOUS
  Filled 2011-10-07: qty 10

## 2011-10-07 MED ORDER — ACETAMINOPHEN 325 MG PO TABS
650.0000 mg | ORAL_TABLET | Freq: Once | ORAL | Status: AC
  Administered 2011-10-07: 650 mg via ORAL
  Filled 2011-10-07: qty 2

## 2011-10-07 MED ORDER — ONDANSETRON HCL 4 MG/2ML IJ SOLN
4.0000 mg | Freq: Once | INTRAMUSCULAR | Status: AC
  Administered 2011-10-07: 4 mg via INTRAVENOUS
  Filled 2011-10-07: qty 2

## 2011-10-07 MED ORDER — SODIUM CHLORIDE 0.9 % IV BOLUS (SEPSIS)
1000.0000 mL | Freq: Once | INTRAVENOUS | Status: AC
  Administered 2011-10-07: 1000 mL via INTRAVENOUS

## 2011-10-07 MED ORDER — IBUPROFEN 800 MG PO TABS
800.0000 mg | ORAL_TABLET | Freq: Once | ORAL | Status: AC
  Administered 2011-10-07: 800 mg via ORAL
  Filled 2011-10-07: qty 1

## 2011-10-07 MED ORDER — MORPHINE SULFATE 4 MG/ML IJ SOLN
4.0000 mg | Freq: Once | INTRAMUSCULAR | Status: AC
  Administered 2011-10-07: 4 mg via INTRAVENOUS
  Filled 2011-10-07: qty 1

## 2011-10-07 NOTE — ED Notes (Signed)
PA made aware of recent VS

## 2011-10-07 NOTE — ED Notes (Signed)
Patient presents with chills, nausea, vomiting, fever and runny nose x 2 days.  Patient has a fever in department and is tachycardic in 130's.

## 2011-10-07 NOTE — ED Provider Notes (Signed)
History     CSN: [id number] Arrival date & time: 10/07/2011  5:15 PM   First MD Initiated Contact with Patient 10/07/11 2029      Chief Complaint  Patient presents with  . Chills  . Emesis    Patient is a 30 y.o. female presenting with vomiting.  Emesis  Associated symptoms include abdominal pain, chills, cough and a fever. Pertinent negatives include no diarrhea.   History provided by the patient. Crystal Myers presents with complaints of nausea, vomiting, fever and cough for the past 2 days. Symptoms began with a runny nose and cough followed by nausea and vomiting. She Has associated abdominal cramps and some low back pains. Crystal Myers denies dysuria, hematuria, urinary frequency, vaginal discharge, vaginal bleeding, vaginal pain. Patient denies diarrhea or constipation. He has history of multiple C-sections and a appendectomy. Patient has no other significant medical history.     Past Medical History  Diagnosis Date  . Anemia     Past Surgical History  Procedure Date  . Cesarean section   . Appendectomy   . Tonsillectomy     History reviewed. No pertinent family history.  History  Substance Use Topics  . Smoking status: Current Everyday Smoker -- 0.5 packs/day  . Smokeless tobacco: Not on file  . Alcohol Use: No    OB History    Grav Para Term Preterm Abortions TAB SAB Ect Mult Living                  Review of Systems  Constitutional: Positive for fever, chills, appetite change and fatigue.  HENT: Positive for rhinorrhea.   Respiratory: Positive for cough. Negative for shortness of breath.   Cardiovascular: Negative for chest pain.  Gastrointestinal: Positive for nausea, vomiting and abdominal pain. Negative for diarrhea, constipation and anal bleeding.  Genitourinary: Positive for flank pain. Negative for dysuria, frequency, hematuria, vaginal bleeding, vaginal discharge and vaginal pain.  Skin: Negative for rash.  All other systems reviewed and are  negative.    Allergies  Augmentin and Percocet  Home Medications   Current Outpatient Rx  Name Route Sig Dispense Refill  . BUTALBITAL-APAP-CAFFEINE 50-325-40 MG PO TABS Oral Take 1 tablet by mouth daily as needed. For migraines     . LEVONORGEST-ETH ESTRAD 91-DAY 0.15-0.03 &0.01 MG PO TABS Oral Take 1 tablet by mouth daily.        BP 136/82  Pulse 133  Temp(Src) 102 F (38.9 C) (Oral)  Resp 18  SpO2 96%  LMP 07/07/2011  Physical Exam  Nursing note and vitals reviewed. Constitutional: She is oriented to person, place, and time. She appears well-developed and well-nourished.       Uncomfortable appearing  HENT:  Head: Normocephalic and atraumatic.  Right Ear: External ear normal.  Left Ear: External ear normal.  Mouth/Throat: Oropharynx is clear and moist.       Rhinorrhea  Neck: Normal range of motion. Neck supple.       No meningeal signs  Cardiovascular: Tachycardia present.   No murmur heard. Pulmonary/Chest: Effort normal and breath sounds normal. She has no wheezes. She has no rales.  Abdominal: Soft. She exhibits no distension and no mass. There is no rebound and no guarding.       Diffuse tenderness to palpation greatest in right upper quadrant.  Musculoskeletal: Normal range of motion.  Neurological: She is alert and oriented to person, place, and time.  Skin: Skin is warm. No rash noted.  Psychiatric: She has a normal mood  and affect.    ED Course  Procedures (including critical care time)  Labs Reviewed  CBC - Abnormal; Notable for the following:    RBC 5.80 (*)    Hemoglobin 11.5 (*)    HCT 35.2 (*)    MCV 60.7 (*)    MCH 19.8 (*)    RDW 15.9 (*)    All other components within normal limits  DIFFERENTIAL - Abnormal; Notable for the following:    Neutrophils Relative 85 (*)    Lymphocytes Relative 8 (*)    Lymphs Abs 0.5 (*)    All other components within normal limits  COMPREHENSIVE METABOLIC PANEL - Abnormal; Notable for the following:     Sodium 134 (*)    Glucose, Bld 102 (*)    All other components within normal limits  URINALYSIS, ROUTINE W REFLEX MICROSCOPIC - Abnormal; Notable for the following:    Appearance CLOUDY (*)    Leukocytes, UA MODERATE (*)    All other components within normal limits  URINE MICROSCOPIC-ADD ON - Abnormal; Notable for the following:    Squamous Epithelial / LPF MANY (*)    Bacteria, UA MANY (*)    All other components within normal limits  POCT PREGNANCY, URINE  POCT PREGNANCY, URINE    Results for orders placed during the hospital encounter of 10/07/11  CBC      Component Value Range   WBC 6.7  4.0 - 10.5 (K/uL)   RBC 5.80 (*) 3.87 - 5.11 (MIL/uL)   Hemoglobin 11.5 (*) 12.0 - 15.0 (g/dL)   HCT 45.4 (*) 09.8 - 46.0 (%)   MCV 60.7 (*) 78.0 - 100.0 (fL)   MCH 19.8 (*) 26.0 - 34.0 (pg)   MCHC 32.7  30.0 - 36.0 (g/dL)   RDW 11.9 (*) 14.7 - 15.5 (%)   Platelets 175  150 - 400 (K/uL)  DIFFERENTIAL      Component Value Range   Neutrophils Relative 85 (*) 43 - 77 (%)   Neutro Abs 5.7  1.7 - 7.7 (K/uL)   Lymphocytes Relative 8 (*) 12 - 46 (%)   Lymphs Abs 0.5 (*) 0.7 - 4.0 (K/uL)   Monocytes Relative 5  3 - 12 (%)   Monocytes Absolute 0.4  0.1 - 1.0 (K/uL)   Eosinophils Relative 2  0 - 5 (%)   Eosinophils Absolute 0.1  0.0 - 0.7 (K/uL)   Basophils Relative 0  0 - 1 (%)   Basophils Absolute 0.0  0.0 - 0.1 (K/uL)  COMPREHENSIVE METABOLIC PANEL      Component Value Range   Sodium 134 (*) 135 - 145 (mEq/L)   Potassium 3.6  3.5 - 5.1 (mEq/L)   Chloride 100  96 - 112 (mEq/L)   CO2 20  19 - 32 (mEq/L)   Glucose, Bld 102 (*) 70 - 99 (mg/dL)   BUN 6  6 - 23 (mg/dL)   Creatinine, Ser 8.29  0.50 - 1.10 (mg/dL)   Calcium 9.1  8.4 - 56.2 (mg/dL)   Total Protein 7.3  6.0 - 8.3 (g/dL)   Albumin 4.0  3.5 - 5.2 (g/dL)   AST 21  0 - 37 (U/L)   ALT 16  0 - 35 (U/L)   Alkaline Phosphatase 65  39 - 117 (U/L)   Total Bilirubin 0.6  0.3 - 1.2 (mg/dL)   GFR calc non Af Amer >90  >90 (mL/min)   GFR  calc Af Amer >90  >90 (mL/min)  URINALYSIS, ROUTINE W  REFLEX MICROSCOPIC      Component Value Range   Color, Urine YELLOW  YELLOW    Appearance CLOUDY (*) CLEAR    Specific Gravity, Urine 1.018  1.005 - 1.030    pH 6.5  5.0 - 8.0    Glucose, UA NEGATIVE  NEGATIVE (mg/dL)   Hgb urine dipstick NEGATIVE  NEGATIVE    Bilirubin Urine NEGATIVE  NEGATIVE    Ketones, ur NEGATIVE  NEGATIVE (mg/dL)   Protein, ur NEGATIVE  NEGATIVE (mg/dL)   Urobilinogen, UA 1.0  0.0 - 1.0 (mg/dL)   Nitrite NEGATIVE  NEGATIVE    Leukocytes, UA MODERATE (*) NEGATIVE   POCT PREGNANCY, URINE      Component Value Range   Preg Test, Ur NEGATIVE    URINE MICROSCOPIC-ADD ON      Component Value Range   Squamous Epithelial / LPF MANY (*) RARE    WBC, UA 11-20  <3 (WBC/hpf)   RBC / HPF 0-2  <3 (RBC/hpf)   Bacteria, UA MANY (*) RARE      Ct Abdomen Pelvis Wo Contrast  10/08/2011  *RADIOLOGY REPORT*  Clinical Data: 30 year old female with fever and back pain, query stone.  CT ABDOMEN AND PELVIS WITHOUT CONTRAST  Technique:  Multidetector CT imaging of the abdomen and pelvis was performed following the standard protocol without intravenous contrast.  Comparison: None.  Findings: Lung bases are clear.  No significant lumbar degenerative changes.  There is asymmetric sclerosis along the right SI joint, but no SI joint erosions. No acute osseous abnormality identified.  No pelvic free fluid.  Noncontrast uterus and adnexa within normal limits.  Bladder is decompressed.  Negative distal colon.  The cecum is partially located within the pelvis.  The appendix is not clearly delineated.  No pericecal inflammation.  Terminal ileum within normal limits.  No dilated small bowel.  Negative noncontrast stomach, duodenum, liver, gallbladder, spleen, pancreas, and adrenal glands.  No left hydronephrosis, nephrolithiasis, hydroureter, or perinephric stranding.  Occasional pelvic phleboliths.  No right hydronephrosis, nephrolithiasis,  hydroureter, or perinephric stranding.  No convincing ureteral calculus.  No abdominal free fluid or focal inflammatory stranding. Lower abdominal ventral soft tissue scarring may be from prior C-section.  IMPRESSION: 1.  No urologic calculus or obstructive uropathy. 2.  No inflammatory findings identified in the abdomen or pelvis. Appendix not clearly delineated, but no pericecal stranding. 3.  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  Original Report Authenticated By: Harley Hallmark, M.D.   Dg Chest 2 View  10/07/2011  *RADIOLOGY REPORT*  Clinical Data: Cough, congestion, fever and diarrhea.  CHEST - 2 VIEW  Comparison: Chest radiograph performed 03/06/2007  Findings: The lungs are well-aerated.  Mild peribronchial thickening is noted.  There is no evidence of focal opacification, pleural effusion or pneumothorax.  The heart is normal in size; the mediastinal contour is within normal limits.  No acute osseous abnormalities are seen.  IMPRESSION: Mild peribronchial thickening noted; lungs otherwise clear.  Original Report Authenticated By: Tonia Ghent, M.D.     1. Pyelonephritis       MDM  Patient seen and evaluated. Patient in no acute distress but appears uncomfortable.  He is feeling much better after IV fluids and nausea medication. She is without vomiting in the ED and tolerates by mouth fluids. Heart rate has improved significantly. Fever also reduced. About it started for suspected pyelonephritis. She will continue with prescriptions at home.       Phill Mutter  Kinnedy Mongiello, [place] 10/08/11 [phone]

## 2011-10-07 NOTE — ED Notes (Signed)
Patient reports she takes tylenol at home and has no adverse reaction.

## 2011-10-08 MED ORDER — CIPROFLOXACIN HCL 500 MG PO TABS: 500.0000 mg | ORAL_TABLET | Freq: Two times a day (BID) | ORAL | Status: AC

## 2011-10-08 MED ORDER — ONDANSETRON HCL 4 MG PO TABS: 4.0000 mg | ORAL_TABLET | Freq: Four times a day (QID) | ORAL | Status: AC

## 2011-10-08 NOTE — ED Notes (Signed)
No existing IV note in chart, however IV removed from Lt. AC with catheter tip intact. Bleeding controlled and gauze applied

## 2011-10-08 NOTE — ED Provider Notes (Signed)
Medical screening examination/treatment/procedure(s) were performed by non-physician practitioner and as supervising physician I was immediately available for consultation/collaboration.  Doug Sou, MD 10/08/11 [phone]

## 2012-03-11 ENCOUNTER — Emergency Department (HOSPITAL_COMMUNITY)
Admission: EM | Admit: 2012-03-11 | Discharge: 2012-03-11 | Discharge status: Against Medical Advice | Payer: Medicaid Other

## 2012-04-05 ENCOUNTER — Encounter (HOSPITAL_COMMUNITY): Payer: Self-pay | Admitting: Emergency Medicine

## 2012-04-05 ENCOUNTER — Emergency Department (HOSPITAL_COMMUNITY)
Admission: EM | Admit: 2012-04-05 | Discharge: 2012-04-05 | Disposition: A | Discharge status: Home Health | Payer: Medicaid Other | Attending: Emergency Medicine | Admitting: Emergency Medicine

## 2012-04-05 DIAGNOSIS — S21109A Unspecified open wound of unspecified front wall of thorax without penetration into thoracic cavity, initial encounter: Secondary | ICD-10-CM | POA: Insufficient documentation

## 2012-04-05 DIAGNOSIS — Z79899 Other long term (current) drug therapy: Secondary | ICD-10-CM | POA: Insufficient documentation

## 2012-04-05 DIAGNOSIS — F17200 Nicotine dependence, unspecified, uncomplicated: Secondary | ICD-10-CM | POA: Insufficient documentation

## 2012-04-05 DIAGNOSIS — Y9229 Other specified public building as the place of occurrence of the external cause: Secondary | ICD-10-CM | POA: Insufficient documentation

## 2012-04-05 DIAGNOSIS — S21119A Laceration without foreign body of unspecified front wall of thorax without penetration into thoracic cavity, initial encounter: Secondary | ICD-10-CM

## 2012-04-05 MED ORDER — TETANUS-DIPHTH-ACELL PERTUSSIS 5-2.5-18.5 LF-MCG/0.5 IM SUSP
0.5000 mL | Freq: Once | INTRAMUSCULAR | Status: AC
  Administered 2012-04-05: 0.5 mL via INTRAMUSCULAR
  Filled 2012-04-05: qty 0.5
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
## 2012-04-05 NOTE — ED Notes (Signed)
MD at bedside for suture placement
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
## 2012-04-05 NOTE — Discharge Instructions (Signed)
You were seen and treated for small lacerations to chest. These were cleaned with irrigation with water and closed with sutures. You'll need to have your sutures removed in 7-10 days. You may follow up with your primary care provider, urgent care Center or return to the emergency room to have your sutures removed. Return sooner if you develop any redness to the skin, fever, chills, sweats.   Laceration Care, Adult A laceration is a cut or lesion that goes through all layers of the skin and into the tissue just beneath the skin. TREATMENT  Some lacerations may not require closure. Some lacerations may not be able to be closed due to an increased risk of infection. It is important to see your caregiver as soon as possible after an injury to minimize the risk of infection and maximize the opportunity for successful closure. If closure is appropriate, pain medicines may be given, if needed. The wound will be cleaned to help prevent infection. Your caregiver will use stitches (sutures), staples, wound glue (adhesive), or skin adhesive strips to repair the laceration. These tools bring the skin edges together to allow for faster healing and a better cosmetic outcome. However, all wounds will heal with a scar. Once the wound has healed, scarring can be minimized by covering the wound with sunscreen during the day for 1 full year. HOME CARE INSTRUCTIONS  For sutures or staples:  Keep the wound clean and dry.   If you were given a bandage (dressing), you should change it at least once a day. Also, change the dressing if it becomes wet or dirty, or as directed by your caregiver.   Wash the wound with soap and water 2 times a day. Rinse the wound off with water to remove all soap. Pat the wound dry with a clean towel.   After cleaning, apply a thin layer of the antibiotic ointment as recommended by your caregiver. This will help prevent infection and keep the dressing from sticking.   You may shower as usual  after the first 24 hours. Do not soak the wound in water until the sutures are removed.   Only take over-the-counter or prescription medicines for pain, discomfort, or fever as directed by your caregiver.   Get your sutures or staples removed as directed by your caregiver.  For skin adhesive strips:  Keep the wound clean and dry.   Do not get the skin adhesive strips wet. You may bathe carefully, using caution to keep the wound dry.   If the wound gets wet, pat it dry with a clean towel.   Skin adhesive strips will fall off on their own. You may trim the strips as the wound heals. Do not remove skin adhesive strips that are still stuck to the wound. They will fall off in time.  For wound adhesive:  You may briefly wet your wound in the shower or bath. Do not soak or scrub the wound. Do not swim. Avoid periods of heavy perspiration until the skin adhesive has fallen off on its own. After showering or bathing, gently pat the wound dry with a clean towel.   Do not apply liquid medicine, cream medicine, or ointment medicine to your wound while the skin adhesive is in place. This may loosen the film before your wound is healed.   If a dressing is placed over the wound, be careful not to apply tape directly over the skin adhesive. This may cause the adhesive to be pulled off before the wound  is healed.   Avoid prolonged exposure to sunlight or tanning lamps while the skin adhesive is in place. Exposure to ultraviolet light in the first year will darken the scar.   The skin adhesive will usually remain in place for 5 to 10 days, then naturally fall off the skin. Do not pick at the adhesive film.  You may need a tetanus shot if:  You cannot remember when you had your last tetanus shot.   You have never had a tetanus shot.  If you get a tetanus shot, your arm may swell, get red, and feel warm to the touch. This is common and not a problem. If you need a tetanus shot and you choose not to have  one, there is a rare chance of getting tetanus. Sickness from tetanus can be serious. SEEK MEDICAL CARE IF:   You have redness, swelling, or increasing pain in the wound.   You see a red line that goes away from the wound.   You have yellowish-white fluid (pus) coming from the wound.   You have a fever.   You notice a bad smell coming from the wound or dressing.   Your wound breaks open before or after sutures have been removed.   You notice something coming out of the wound such as wood or glass.   Your wound is on your hand or foot and you cannot move a finger or toe.  SEEK IMMEDIATE MEDICAL CARE IF:   Your pain is not controlled with prescribed medicine.   You have severe swelling around the wound causing pain and numbness or a change in color in your arm, hand, leg, or foot.   Your wound splits open and starts bleeding.   You have worsening numbness, weakness, or loss of function of any joint around or beyond the wound.   You develop painful lumps near the wound or on the skin anywhere on your body.  MAKE SURE YOU:   Understand these instructions.   Will watch your condition.   Will get help right away if you are not doing well or get worse.  Document Released: 11/11/2005 Document Revised: 10/31/2011 Document Reviewed: 05/07/2011 [facility] Patient Information 2012 [place], [place].   RESOURCE GUIDE  Dental Problems  Patients with Medicaid: [facility] [phone] [address]                                           [phone] [address] Phone:  [phone]                                                  Phone:  [phone]  If unable to pay or uninsured, contact:  [facility] or [facility]. to become qualified for the adult dental clinic.  Chronic Pain Problems Contact [facility]  [phone] Patients need to be referred by their primary care doctor.  Insufficient Money for  Medicine Contact [employer] Way:  call "211" or [facility] 9254824024.  No Primary Care Doctor Call [facility]  [phone] Other agencies that provide inexpensive medical care  [facility]  [phone]    [facility]  [phone]    [facility]  [phone]    [facility]  [phone]    [facility]  [phone]    [facility]  [phone]  Psychological Services [facility]  [phone] [facility]  [phone] [facility] Mental Health   [phone] (emergency services [phone])  Substance Abuse Resources [facility]  [phone] [facility] [phone] [facility] [place] [phone] Floydene Flock [phone] Residential & Outpatient Substance Abuse Program  [phone]  Abuse/Neglect [facility] Child Abuse Hotline 463-582-4070 [facility] Child Abuse Hotline 707-240-8832 (After Hours)  Emergency Shelter [facility] 502-383-7466  Maternity Homes [facility] at the [facility] [phone] [facility] [phone]  MRSA Hotline #:   657-742-0572    [facility] Resources  [facility]     [employer]                          [facility] Dept. [address] [place]                       [address]      [address]  Blondell Reveal Phone:  [phone]                                   Phone:  [phone]                 Phone:  [phone]  [facility] Mental Health Phone:  [phone]  [facility] Child Abuse Hotline 408-233-3733 [phone] (After Hours)

## 2012-04-06 NOTE — ED Provider Notes (Signed)
Medical screening examination/treatment/procedure(s) were performed by non-physician practitioner and as supervising physician I was immediately available for consultation/collaboration.   Hanley Seamen, MD 04/06/12 [phone]

## 2012-04-09 ENCOUNTER — Encounter (HOSPITAL_COMMUNITY): Payer: Self-pay | Admitting: Emergency Medicine

## 2012-04-09 ENCOUNTER — Emergency Department (HOSPITAL_COMMUNITY)
Admission: EM | Admit: 2012-04-09 | Discharge: 2012-04-09 | Disposition: A | Discharge status: Home / Self Care | Payer: Medicaid Other | Attending: Emergency Medicine | Admitting: Emergency Medicine

## 2012-04-09 DIAGNOSIS — R109 Unspecified abdominal pain: Secondary | ICD-10-CM | POA: Insufficient documentation

## 2012-04-09 DIAGNOSIS — F17200 Nicotine dependence, unspecified, uncomplicated: Secondary | ICD-10-CM | POA: Insufficient documentation

## 2012-04-09 DIAGNOSIS — K529 Noninfective gastroenteritis and colitis, unspecified: Secondary | ICD-10-CM

## 2012-04-09 DIAGNOSIS — R197 Diarrhea, unspecified: Secondary | ICD-10-CM | POA: Insufficient documentation

## 2012-04-09 DIAGNOSIS — K5289 Other specified noninfective gastroenteritis and colitis: Secondary | ICD-10-CM | POA: Insufficient documentation

## 2012-04-09 DIAGNOSIS — R112 Nausea with vomiting, unspecified: Secondary | ICD-10-CM | POA: Insufficient documentation

## 2012-04-09 LAB — DIFFERENTIAL
Basophils Absolute: 0 10*3/uL (ref 0.0–0.1)
Basophils Relative: 0 % (ref 0–1)
Eosinophils Absolute: 0.1 10*3/uL (ref 0.0–0.7)
Eosinophils Relative: 2 % (ref 0–5)
Lymphocytes Relative: 16 % (ref 12–46)
Lymphs Abs: 1 10*3/uL (ref 0.7–4.0)
Monocytes Absolute: 0.3 10*3/uL (ref 0.1–1.0)
Monocytes Relative: 5 % (ref 3–12)
Neutro Abs: 4.7 10*3/uL (ref 1.7–7.7)
Neutrophils Relative %: 77 % (ref 43–77)

## 2012-04-09 LAB — CBC
HCT: 33.9 % — ABNORMAL LOW (ref 36.0–46.0)
Hemoglobin: 10.7 g/dL — ABNORMAL LOW (ref 12.0–15.0)
MCH: 19.3 pg — ABNORMAL LOW (ref 26.0–34.0)
MCHC: 31.6 g/dL (ref 30.0–36.0)
MCV: 61.2 fL — ABNORMAL LOW (ref 78.0–100.0)
Platelets: 168 10*3/uL (ref 150–400)
RBC: 5.54 MIL/uL — ABNORMAL HIGH (ref 3.87–5.11)
RDW: 15.6 % — ABNORMAL HIGH (ref 11.5–15.5)
WBC: 6.1 10*3/uL (ref 4.0–10.5)

## 2012-04-09 LAB — LIPASE, BLOOD: Lipase: 24 U/L (ref 11–59)

## 2012-04-09 LAB — COMPREHENSIVE METABOLIC PANEL
Alkaline Phosphatase: 63 U/L (ref 39–117)
BUN: 10 mg/dL (ref 6–23)
Creatinine, Ser: 0.55 mg/dL (ref 0.50–1.10)
GFR calc Af Amer: >90 mL/min (ref >90)
Glucose, Bld: 104 mg/dL — ABNORMAL HIGH (ref 70–99)
Potassium: 3.7 mEq/L (ref 3.5–5.1)
Total Bilirubin: 0.6 mg/dL (ref 0.3–1.2)
Total Protein: 6.8 g/dL (ref 6.0–8.3)

## 2012-04-09 LAB — URINALYSIS, ROUTINE W REFLEX MICROSCOPIC
Bilirubin Urine: NEGATIVE
Ketones, ur: NEGATIVE mg/dL
Nitrite: NEGATIVE
Specific Gravity, Urine: 1.028 (ref 1.005–1.030)
Urobilinogen, UA: 1 mg/dL (ref 0.0–1.0)

## 2012-04-09 LAB — POCT PREGNANCY, URINE: Preg Test, Ur: NEGATIVE

## 2012-04-09 MED ORDER — TRAMADOL HCL 50 MG PO TABS: 50.0000 mg | ORAL_TABLET | Freq: Four times a day (QID) | ORAL | Status: AC | PRN

## 2012-04-09 MED ORDER — ONDANSETRON HCL 4 MG/2ML IJ SOLN
4.0000 mg | Freq: Once | INTRAMUSCULAR | Status: AC
  Administered 2012-04-09: 4 mg via INTRAVENOUS
  Filled 2012-04-09: qty 2

## 2012-04-09 MED ORDER — SODIUM CHLORIDE 0.9 % IV BOLUS (SEPSIS)
1000.0000 mL | Freq: Once | INTRAVENOUS | Status: AC
  Administered 2012-04-09: 1000 mL via INTRAVENOUS

## 2012-04-09 MED ORDER — FENTANYL CITRATE 0.05 MG/ML IJ SOLN
50.0000 ug | Freq: Once | INTRAMUSCULAR | Status: AC
  Administered 2012-04-09: 50 ug via INTRAVENOUS
  Filled 2012-04-09: qty 2

## 2012-04-09 MED ORDER — KETOROLAC TROMETHAMINE 30 MG/ML IJ SOLN
30.0000 mg | Freq: Once | INTRAMUSCULAR | Status: AC
  Administered 2012-04-09: 30 mg via INTRAVENOUS
  Filled 2012-04-09: qty 1

## 2012-04-09 MED ORDER — PROMETHAZINE HCL 25 MG PO TABS: 25.0000 mg | ORAL_TABLET | Freq: Four times a day (QID) | ORAL | Status: DC | PRN

## 2012-04-09 MED ORDER — PANTOPRAZOLE SODIUM 40 MG IV SOLR
40.0000 mg | Freq: Once | INTRAVENOUS | Status: AC
  Administered 2012-04-09: 40 mg via INTRAVENOUS
  Filled 2012-04-09: qty 40

## 2012-04-09 NOTE — ED Provider Notes (Signed)
Medical screening examination/treatment/procedure(s) were performed by non-physician practitioner and as supervising physician I was immediately available for consultation/collaboration.  Raeford Razor, MD 04/09/12 [phone]

## 2012-04-09 NOTE — ED Provider Notes (Signed)
History     CSN: [id number]  Arrival date & time 04/09/12  0800   First MD Initiated Contact with Patient 04/09/12 0819    8:41 AM HPI Patient reports upper abdominal pain, nausea, vomiting, diarrhea that began yesterday after eating dinner at [employer]. Reports pain has been persistent. Describes pain as a dull aching pain. Denies fever, back pain, urinary symptoms, vaginal discharge.   Patient is a 31 y.o. female presenting with abdominal pain. The history is provided by the patient.  Abdominal Pain The primary symptoms of the illness include abdominal pain, nausea, vomiting and diarrhea. The primary symptoms of the illness do not include fever, fatigue, shortness of breath, hematemesis, hematochezia, dysuria, vaginal discharge or vaginal bleeding. The onset of the illness was gradual. The problem has been gradually worsening.  The patient states that she believes she is currently not pregnant. The patient has not had a change in bowel habit. Symptoms associated with the illness do not include chills, anorexia, diaphoresis, constipation, urgency, hematuria, frequency or back pain.    Past Medical History  Diagnosis Date  . Anemia     Past Surgical History  Procedure Date  . Cesarean section   . Appendectomy   . Tonsillectomy   . Cesarean section     x4    No family history on file.  History  Substance Use Topics  . Smoking status: Current Everyday Smoker -- 0.5 packs/day  . Smokeless tobacco: Not on file  . Alcohol Use: Yes     occasional    OB History    Grav Para Term Preterm Abortions TAB SAB Ect Mult Living                  Review of Systems  Constitutional: Negative for fever, chills, diaphoresis and fatigue.  Respiratory: Negative for shortness of breath.   Gastrointestinal: Positive for nausea, vomiting, abdominal pain and diarrhea. Negative for constipation, hematochezia, anorexia and hematemesis.  Genitourinary: Negative for dysuria, urgency,  frequency, hematuria, vaginal bleeding and vaginal discharge.  Musculoskeletal: Negative for back pain.  All other systems reviewed and are negative.    Allergies  Amoxicillin-pot clavulanate and Percocet  Home Medications   Current Outpatient Rx  Name Route Sig Dispense Refill  . BUTALBITAL-APAP-CAFFEINE 50-325-40 MG PO TABS Oral Take 1 tablet by mouth daily as needed. For migraines     . LEVONORGEST-ETH ESTRAD 91-DAY 0.15-0.03 &0.01 MG PO TABS Oral Take 1 tablet by mouth daily.      Marland Kitchen ONDANSETRON HCL 4 MG PO TABS Oral Take 4 mg by mouth every 8 (eight) hours as needed. Nausea    . PAROXETINE HCL 10 MG PO TABS Oral Take 10 mg by mouth daily.    . TOPIRAMATE 25 MG PO TABS Oral Take 25 mg by mouth 2 (two) times daily.    . TRAZODONE HCL 50 MG PO TABS Oral Take 50 mg by mouth at bedtime.      BP 121/77  Pulse 106  Temp(Src) 99.1 F (37.3 C) (Oral)  Resp 21  SpO2 99%  LMP 02/02/2012  Physical Exam  Vitals reviewed. Constitutional: Vital signs are normal. She appears well-developed and well-nourished.  HENT:  Head: Normocephalic and atraumatic.  Eyes: Conjunctivae are normal. Pupils are equal, round, and reactive to light.  Neck: Normal range of motion. Neck supple.  Cardiovascular: Normal rate, regular rhythm and normal heart sounds.  Exam reveals no friction rub.   No murmur heard. Pulmonary/Chest: Effort normal and breath sounds  normal. She has no wheezes. She has no rhonchi. She has no rales. She exhibits no tenderness.  Abdominal: Normal appearance and bowel sounds are normal. She exhibits no distension and no mass. There is no hepatosplenomegaly. There is tenderness in the epigastric area. There is no rigidity, no rebound, no guarding, no CVA tenderness, no tenderness at McBurney's point and negative Murphy's sign.  Musculoskeletal: Normal range of motion.  Skin: Skin is warm and dry. No rash noted. No erythema. No pallor.    ED Course  Procedures   Results for orders  placed during the hospital encounter of 04/09/12  COMPREHENSIVE METABOLIC PANEL      Component Value Range   Sodium 135  135 - 145 (mEq/L)   Potassium 3.7  3.5 - 5.1 (mEq/L)   Chloride 104  96 - 112 (mEq/L)   CO2 18 (*) 19 - 32 (mEq/L)   Glucose, Bld 104 (*) 70 - 99 (mg/dL)   BUN 10  6 - 23 (mg/dL)   Creatinine, Ser 1.61  0.50 - 1.10 (mg/dL)   Calcium 8.4  8.4 - 09.6 (mg/dL)   Total Protein 6.8  6.0 - 8.3 (g/dL)   Albumin 3.6  3.5 - 5.2 (g/dL)   AST 15  0 - 37 (U/L)   ALT 11  0 - 35 (U/L)   Alkaline Phosphatase 63  39 - 117 (U/L)   Total Bilirubin 0.6  0.3 - 1.2 (mg/dL)   GFR calc non Af Amer >90  >90 (mL/min)   GFR calc Af Amer >90  >90 (mL/min)  LIPASE, BLOOD      Component Value Range   Lipase 24  11 - 59 (U/L)  URINALYSIS, ROUTINE W REFLEX MICROSCOPIC      Component Value Range   Color, Urine AMBER (*) YELLOW    APPearance CLOUDY (*) CLEAR    Specific Gravity, Urine 1.028  1.005 - 1.030    pH 6.5  5.0 - 8.0    Glucose, UA NEGATIVE  NEGATIVE (mg/dL)   Hgb urine dipstick NEGATIVE  NEGATIVE    Bilirubin Urine NEGATIVE  NEGATIVE    Ketones, ur NEGATIVE  NEGATIVE (mg/dL)   Protein, ur NEGATIVE  NEGATIVE (mg/dL)   Urobilinogen, UA 1.0  0.0 - 1.0 (mg/dL)   Nitrite NEGATIVE  NEGATIVE    Leukocytes, UA NEGATIVE  NEGATIVE   CBC      Component Value Range   WBC 6.1  4.0 - 10.5 (K/uL)   RBC 5.54 (*) 3.87 - 5.11 (MIL/uL)   Hemoglobin 10.7 (*) 12.0 - 15.0 (g/dL)   HCT 04.5 (*) 40.9 - 46.0 (%)   MCV 61.2 (*) 78.0 - 100.0 (fL)   MCH 19.3 (*) 26.0 - 34.0 (pg)   MCHC 31.6  30.0 - 36.0 (g/dL)   RDW 81.1 (*) 91.4 - 15.5 (%)   Platelets 168  150 - 400 (K/uL)  DIFFERENTIAL      Component Value Range   Neutrophils Relative 77  43 - 77 (%)   Lymphocytes Relative 16  12 - 46 (%)   Monocytes Relative 5  3 - 12 (%)   Eosinophils Relative 2  0 - 5 (%)   Basophils Relative 0  0 - 1 (%)   Neutro Abs 4.7  1.7 - 7.7 (K/uL)   Lymphs Abs 1.0  0.7 - 4.0 (K/uL)   Monocytes Absolute 0.3  0.1 -  1.0 (K/uL)   Eosinophils Absolute 0.1  0.0 - 0.7 (K/uL)   Basophils Absolute 0.0  0.0 - 0.1 (K/uL)   RBC Morphology MICROCYTES     WBC Morphology MILD LEFT SHIFT (1-5% METAS, OCC MYELO, OCC BANDS)     Smear Review PLATELET COUNT CONFIRMED BY SMEAR    POCT PREGNANCY, URINE      Component Value Range   Preg Test, Ur NEGATIVE  NEGATIVE     MDM   Labs within normal limits. I've checked on the patient multiple times and she continued to sleep in her room. Pt has not vomited in the ED. Will d/c with analgesics, and antiemetics. Advised closed f/u if symptoms worsened in 2-3 days. Pt voices understanding and is ready for d/c  Dx: gastroenteritis     Thomasene Lot, PA-C 04/09/12 1231

## 2012-04-09 NOTE — Discharge Instructions (Signed)
B.R.A.T. Diet Your doctor has recommended the B.R.A.T. diet for you or your child until the condition improves. This is often used to help control diarrhea and vomiting symptoms. If you or your child can tolerate clear liquids, you may have:  Bananas.   Rice.   Applesauce.   Toast (and other simple starches such as crackers, potatoes, noodles).  Be sure to avoid dairy products, meats, and fatty foods until symptoms are better. Fruit juices such as apple, grape, and prune juice can make diarrhea worse. Avoid these. Continue this diet for 2 days or as instructed by your caregiver. Document Released: 11/11/2005 Document Revised: 10/31/2011 Document Reviewed: 04/30/2007 Exit[employer] Patient Information 2012 [employer].Viral Gastroenteritis Viral gastroenteritis is also known as stomach flu. This condition affects the stomach and intestinal tract. It can cause sudden diarrhea and vomiting. The illness typically lasts 3 to 8 days. Most people develop an immune response that eventually gets rid of the virus. While this natural response develops, the virus can make you quite ill. CAUSES  Many different viruses can cause gastroenteritis, such as rotavirus or noroviruses. You can catch one of these viruses by consuming contaminated food or water. You may also catch a virus by sharing utensils or other personal items with an infected person or by touching a contaminated surface. SYMPTOMS  The most common symptoms are diarrhea and vomiting. These problems can cause a severe loss of body fluids (dehydration) and a body salt (electrolyte) imbalance. Other symptoms may include:  Fever.   Headache.   Fatigue.   Abdominal pain.  DIAGNOSIS  Your caregiver can usually diagnose viral gastroenteritis based on your symptoms and a physical exam. A stool sample may also be taken to test for the presence of viruses or other infections. TREATMENT  This illness typically goes away on its own. Treatments are aimed  at rehydration. The most serious cases of viral gastroenteritis involve vomiting so severely that you are not able to keep fluids down. In these cases, fluids must be given through an intravenous line (IV). HOME CARE INSTRUCTIONS   Drink enough fluids to keep your urine clear or pale yellow. Drink small amounts of fluids frequently and increase the amounts as tolerated.   Ask your caregiver for specific rehydration instructions.   Avoid:   Foods high in sugar.   Alcohol.   Carbonated drinks.   Tobacco.   Juice.   Caffeine drinks.   Extremely hot or cold fluids.   Fatty, greasy foods.   Too much intake of anything at one time.   Dairy products until 24 to 48 hours after diarrhea stops.   You may consume probiotics. Probiotics are active cultures of beneficial bacteria. They may lessen the amount and number of diarrheal stools in adults. Probiotics can be found in yogurt with active cultures and in supplements.   Wash your hands well to avoid spreading the virus.   Only take over-the-counter or prescription medicines for pain, discomfort, or fever as directed by your caregiver. Do not give aspirin to children. Antidiarrheal medicines are not recommended.   Ask your caregiver if you should continue to take your regular prescribed and over-the-counter medicines.   Keep all follow-up appointments as directed by your caregiver.  SEEK IMMEDIATE MEDICAL CARE IF:   You are unable to keep fluids down.   You do not urinate at least once every 6 to 8 hours.   You develop shortness of breath.   You notice blood in your stool or vomit. This may   look like coffee grounds.   You have abdominal pain that increases or is concentrated in one small area (localized).   You have persistent vomiting or diarrhea.   You have a fever.   The patient is a child younger than 3 months, and he or she has a fever.   The patient is a child older than 3 months, and he or she has a fever and  persistent symptoms.   The patient is a child older than 3 months, and he or she has a fever and symptoms suddenly get worse.   The patient is a baby, and he or she has no tears when crying.  MAKE SURE YOU:   Understand these instructions.   Will watch your condition.   Will get help right away if you are not doing well or get worse.  Document Released: 11/11/2005 Document Revised: 10/31/2011 Document Reviewed: 08/28/2011 [employer] Patient Information 2012 [employer]. 

## 2012-04-09 NOTE — ED Notes (Signed)
PA called about pts meds.  This nurse got two new ambulances and a triage at one time for total of three patients.  Called the charge nurse for help.

## 2012-04-09 NOTE — ED Notes (Signed)
Pt presenting to ed with c/o abdominal pain with nausea, vomiting and diarrhea

## 2012-04-11 ENCOUNTER — Encounter (HOSPITAL_COMMUNITY): Payer: Self-pay | Admitting: *Deleted

## 2012-04-11 ENCOUNTER — Emergency Department (HOSPITAL_COMMUNITY)
Admission: EM | Admit: 2012-04-11 | Discharge: 2012-04-11 | Disposition: A | Discharge status: Home / Self Care | Payer: Medicaid Other | Attending: Emergency Medicine | Admitting: Emergency Medicine

## 2012-04-11 DIAGNOSIS — Z4802 Encounter for removal of sutures: Secondary | ICD-10-CM | POA: Insufficient documentation

## 2012-04-11 NOTE — ED Notes (Signed)
Pt from home with reports of 2 sutures in left breast that need to be removed, were placed early Sunday morning a week ago.

## 2012-04-11 NOTE — ED Provider Notes (Signed)
History     CSN: [id number]  Arrival date & time 04/11/12  1344   First MD Initiated Contact with Patient 04/11/12 1552      Chief Complaint  Patient presents with  . Suture / Staple Removal    (Consider location/radiation/quality/duration/timing/severity/associated sxs/prior treatment) HPI  Patient to the ED for suture removal. She has to sutures to the left breast that were placed one week ago. She denies having any complications, concerns or questions. Denies puss, worsening pain, fevers, N/V/D.   Past Medical History  Diagnosis Date  . Anemia     Past Surgical History  Procedure Date  . Cesarean section   . Appendectomy   . Tonsillectomy   . Cesarean section     x4    History reviewed. No pertinent family history.  History  Substance Use Topics  . Smoking status: Current Everyday Smoker -- 0.5 packs/day    Types: Cigarettes  . Smokeless tobacco: Never Used  . Alcohol Use: Yes     occasional    OB History    Grav Para Term Preterm Abortions TAB SAB Ect Mult Living                  Review of Systems   HEENT: denies blurry vision or change in hearing PULMONARY: Denies difficulty breathing and SOB CARDIAC: denies chest pain or heart palpitations MUSCULOSKELETAL:  denies being unable to ambulate ABDOMEN AL: denies abdominal pain GU: denies loss of bowel or urinary control NEURO: denies numbness and tingling in extremities   Allergies  Amoxicillin-pot clavulanate and Percocet  Home Medications   Current Outpatient Rx  Name Route Sig Dispense Refill  . BUTALBITAL-APAP-CAFFEINE 50-325-40 MG PO TABS Oral Take 1 tablet by mouth daily as needed. For migraines     . LEVONORGEST-ETH ESTRAD 91-DAY 0.15-0.03 &0.01 MG PO TABS Oral Take 1 tablet by mouth daily.      Marland Kitchen ONDANSETRON HCL 4 MG PO TABS Oral Take 4 mg by mouth every 8 (eight) hours as needed. Nausea    . PAROXETINE HCL 10 MG PO TABS Oral Take 10 mg by mouth daily.    Marland Kitchen PROMETHAZINE HCL 25 MG PO  TABS Oral Take 1 tablet (25 mg total) by mouth every 6 (six) hours as needed for nausea. 20 tablet 0  . TOPIRAMATE 25 MG PO TABS Oral Take 25 mg by mouth 2 (two) times daily.    . TRAZODONE HCL 50 MG PO TABS Oral Take 50 mg by mouth at bedtime.    . TRAMADOL HCL 50 MG PO TABS Oral Take 1 tablet (50 mg total) by mouth every 6 (six) hours as needed for pain. 30 tablet 0    BP 106/73  Pulse 80  Temp(Src) 98.5 F (36.9 C) (Axillary)  Resp 18  SpO2 100%  LMP 02/02/2012  Physical Exam  Nursing note and vitals reviewed. Constitutional: She appears well-developed and well-nourished. No distress.  HENT:  Head: Normocephalic and atraumatic.  Eyes: Pupils are equal, round, and reactive to light.  Neck: Normal range of motion. Neck supple.  Cardiovascular: Normal rate and regular rhythm.   Pulmonary/Chest: Effort normal.  Abdominal: Soft.  Neurological: She is alert.  Skin: Skin is warm and dry.       ED Course  Procedures (including critical care time)  Labs Reviewed - No data to display No results found.   1. Visit for suture removal       MDM  Wound healing well. No signs of  infection to site.   Pt has been advised of the symptoms that warrant their return to the ED. Patient has voiced understanding and has agreed to follow-up with the PCP or specialist.         Dorthula Matas, PA 04/11/12 1557

## 2012-04-11 NOTE — Discharge Instructions (Signed)
Staple Removal  Care After  The staples used to close your skin have been removed. The wound needs continued care so it can heal completely and without problems. The care described here will need to be done for another 5-10 days unless your caregiver advises otherwise.   HOME CARE INSTRUCTIONS    Keep wound site dry and clean.   If skin adhesive strips were applied after the staples were removed, they will begin to peel off in a few days. If they remain after fourteen days, they may be peeled off and discarded.   If you still have a dressing, change it at least once a day or as instructed by your caregiver. If the bandage sticks, soak it off with warm water. Pat dry with a clean towel. Look for signs of infection (see below).   Reapply cream or ointment according to your caregiver's instruction. This will help prevent infection and keep the bandage from sticking. Use of a non-stick material over the wound and under the dressing or wrap will also help keep the bandage from sticking.   If the bandage becomes wet, dirty or develops a foul smell, change it as soon as possible.   New scars become sunburned easily. Use sunscreens with protection factor (SPF) of at least 15 when out in the sun.   Only take over-the-counter or prescription medicines for pain, discomfort or fever as directed by your caregiver.  SEEK IMMEDIATE MEDICAL CARE IF:    There is redness, swelling or increasing pain in the wound.   Pus is coming from the wound.   An unexplained oral temperature above 102 F (38.9 C) develops.   You notice a foul smell coming from the wound or dressing.   There is a breaking open of the suture line (edges not staying together) of the wound edges after staples have been removed.  Document Released: 10/24/2008 Document Revised: 10/31/2011 Document Reviewed: 10/24/2008  [employer] Patient Information 2012 [employer].

## 2012-04-12 NOTE — ED Provider Notes (Signed)
Medical screening examination/treatment/procedure(s) were performed by non-physician practitioner and as supervising physician I was immediately available for consultation/collaboration.  Elester Apodaca, MD 04/12/12 0014 

## 2012-05-04 ENCOUNTER — Emergency Department (HOSPITAL_COMMUNITY)
Admission: EM | Admit: 2012-05-04 | Discharge: 2012-05-04 | Disposition: A | Discharge status: Home / Self Care | Payer: Medicaid Other | Attending: Emergency Medicine | Admitting: Emergency Medicine

## 2012-05-04 ENCOUNTER — Encounter (HOSPITAL_COMMUNITY): Payer: Self-pay | Admitting: Emergency Medicine

## 2012-05-04 DIAGNOSIS — F17200 Nicotine dependence, unspecified, uncomplicated: Secondary | ICD-10-CM | POA: Insufficient documentation

## 2012-05-04 DIAGNOSIS — H9202 Otalgia, left ear: Secondary | ICD-10-CM

## 2012-05-04 DIAGNOSIS — J3489 Other specified disorders of nose and nasal sinuses: Secondary | ICD-10-CM | POA: Insufficient documentation

## 2012-05-04 DIAGNOSIS — Z880 Allergy status to penicillin: Secondary | ICD-10-CM | POA: Insufficient documentation

## 2012-05-04 DIAGNOSIS — J329 Chronic sinusitis, unspecified: Secondary | ICD-10-CM

## 2012-05-04 MED ORDER — NAPROXEN 250 MG PO TABS: 250.0000 mg | ORAL_TABLET | Freq: Two times a day (BID) | ORAL | Status: DC

## 2012-05-04 MED ORDER — ANTIPYRINE-BENZOCAINE 5.4-1.4 % OT SOLN
3.0000 [drp] | Freq: Once | OTIC | Status: AC
  Administered 2012-05-04: 3 [drp] via OTIC

## 2012-05-04 NOTE — ED Notes (Signed)
Pt sts woke up this am with left ear pain; pt sts recent sinus congestion

## 2012-05-04 NOTE — ED Provider Notes (Signed)
History     CSN: [id number]  Arrival date & time 05/04/12  1709   First MD Initiated Contact with Patient 05/04/12 1729      Chief Complaint  Patient presents with  . Otalgia     HPI Pt was seen at 1730.  Per pt, c/o gradual onset and persistence of constant left ear "pain" since last night.  Has been associated with runny/stuffy nose and sinus congestion for past several days.  Denies fevers, no rash, no sore throat, no cough, no N/V/D, no injury.    Past Medical History  Diagnosis Date  . Anemia     Past Surgical History  Procedure Date  . Cesarean section   . Appendectomy   . Tonsillectomy   . Cesarean section     x4   History  Substance Use Topics  . Smoking status: Current Everyday Smoker -- 0.5 packs/day    Types: Cigarettes  . Smokeless tobacco: Never Used  . Alcohol Use: Yes     occasional    Review of Systems ROS: Statement: All systems negative except as marked or noted in the HPI; Constitutional: Negative for fever and chills. ; ; Eyes: Negative for eye pain, redness and discharge. ; ; ENMT: Negative for hoarseness, sore throat, +runny/stuffy nose, left ear pain, nasal congestion, sinus pressure. ; ; Cardiovascular: Negative for chest pain, palpitations, diaphoresis, dyspnea and peripheral edema. ; ; Respiratory: Negative for cough, wheezing and stridor. ; ; Gastrointestinal: Negative for nausea, vomiting, diarrhea, abdominal pain, blood in stool, hematemesis, jaundice and rectal bleeding. . ; ; Genitourinary: Negative for dysuria, flank pain and hematuria. ; ; Musculoskeletal: Negative for back pain and neck pain. Negative for swelling and trauma.; ; Skin: Negative for pruritus, rash, abrasions, blisters, bruising and skin lesion.; ; Neuro: Negative for headache, lightheadedness and neck stiffness. Negative for weakness, altered level of consciousness , altered mental status, extremity weakness, paresthesias, involuntary movement, seizure and syncope.      Allergies  Amoxicillin-pot clavulanate and Percocet  Home Medications   Current Outpatient Rx  Name Route Sig Dispense Refill  . BUTALBITAL-APAP-CAFFEINE 50-325-40 MG PO TABS Oral Take 1 tablet by mouth daily as needed. For migraines     . LEVONORGEST-ETH ESTRAD 91-DAY 0.15-0.03 &0.01 MG PO TABS Oral Take 1 tablet by mouth daily.      Marland Kitchen ONDANSETRON HCL 4 MG PO TABS Oral Take 4 mg by mouth every 8 (eight) hours as needed. Nausea    . PAROXETINE HCL 10 MG PO TABS Oral Take 10 mg by mouth daily.    . TOPIRAMATE 25 MG PO TABS Oral Take 25 mg by mouth daily.     . TRAZODONE HCL 50 MG PO TABS Oral Take 50 mg by mouth at bedtime.      BP 127/76  Pulse 100  Temp(Src) 98.8 F (37.1 C) (Oral)  Resp 18  SpO2 100%  LMP 02/02/2012  Physical Exam 1735: Physical examination:  Nursing notes reviewed; Vital signs and O2 SAT reviewed;  Constitutional: Well developed, Well nourished, Well hydrated, In no acute distress; Head:  Normocephalic, atraumatic; Eyes: EOMI, PERRL, No scleral icterus; ENMT: TM's clear bilat.  +edemetous nasal turbinates bilat with clear rhinorrhea. Mouth and pharynx normal, Mucous membranes moist; Neck: Supple, Full range of motion, No lymphadenopathy; Cardiovascular: Regular rate and rhythm, No murmur or gallop; Respiratory: Breath sounds clear & equal bilaterally, No rales, rhonchi, wheezes.  Speaking full sentences with ease. Normal respiratory effort/excursion; Chest: Nontender, Movement normal;; Extremities: Pulses normal,  No tenderness, No edema, No calf edema or asymmetry.; Neuro: AA&Ox3, Major CN grossly intact.  No gross focal motor or sensory deficits in extremities.; Skin: Color normal, Warm, Dry, no rash.    ED Course  Procedures   MDM  MDM Reviewed: nursing note and vitals     5:42 PM:  No acute ear infection.  +sinus congestion.  Will tx symptomatically for now.         Laray Anger, DO 05/06/12 1647

## 2012-05-04 NOTE — Discharge Instructions (Signed)
RESOURCE GUIDE  Chronic Pain Problems: Contact [facility]  [phone] Patients need to be referred by their primary care doctor.  Insufficient Money for Medicine: Contact United Way:  call "211" or [facility] [phone].  No Primary Care Doctor: - Call [facility]  [phone] - can help you locate a primary care doctor that  accepts your insurance, provides certain services, etc. - Physician Referral Service- 1-800-533-3463  Agencies that provide inexpensive medical care: - [facility]  [phone] - [facility]  [phone] - [facility]  [phone] - [facility]  [phone] - Planned Parenthood  [phone] - [facility]  [phone]  Medicaid-accepting [place] Providers: - [facility]- [address]  [phone], Mon-Fri 9am-7pm, Sat 9am-1pm - [facility]- [address], [address]  [phone] - [facility]- [address], [address]  [phone] - [facility]- [address] [place] [address]  [phone] - Veita Bland- [address], [phone]  Only accepts [place] Access Medicaid patients after they have their name  applied to their card  Self Pay (no insurance) in [place]: - Sickle Cell Patients: Dr Eric Dean, [facility]  [address], [phone] - [facility]- [address]  [phone]       -     [facility] [place]- [address]       -     [facility]- see information above (Speak to Pam H if you do not have insurance)       -  [facility]- [address], [phone]       -  [facility] [place]- [address],  [phone]       -  [facility]- [address] [place] [address], [phone]       -  Dr Osei-Bonsu-  [address], [place], [phone]       -  [facility]- [address], [phone]       -  [facility]- [address] [place] [address], [phone], also [address], [phone]       -    [facility]- [address], [phone], 1st & 3rd Saturday   every month, 10am-1pm  1) Find a Doctor and Pay Out of Pocket Although you won't have to find out who is covered by your insurance plan, it is a good idea to ask around and get recommendations. You will then need to call the office and see if the doctor you have chosen will accept you as a new patient and what types of options they offer for patients who are self-pay. Some doctors offer discounts or will set up payment plans for their patients who do not have insurance, but you will need to ask so you aren't surprised when you get to your appointment.  2) Contact Your Local Health Department Not all health departments have doctors that can see patients for sick visits, but many do, so it is worth a call to see if yours does. If you don't know where your local health department is, you can check in your phone book. The CDC also has a tool to help you locate your state's health department, and many state websites also have   listings of all of their local health departments.  3) Find a Walk-in Clinic If your illness is not likely to be very severe or complicated, you may want to try a walk in clinic. These are popping up all over the country in pharmacies, drugstores, and shopping centers. They're usually staffed by nurse practitioners or physician assistants that have been trained to treat common illnesses and complaints. They're usually fairly quick and inexpensive. However, if you have serious medical issues or chronic medical problems, these are probably not your best option  STD Testing - [place] [facility] [place], [facility], [address], [place], phone [phone] or [phone].  Monday - Friday, call for an appointment. - [place]  [facility] [place], [facility] Clinic, [address], [place], phone [phone] or 1-877-539-9860.  Monday - Friday, call for an appointment.  Abuse/Neglect: - [place] Child Abuse Hotline (336) 641-3795 - [place] Child Abuse Hotline 800-378-5315 (After Hours)  Emergency Shelter:  [facility] (336) 271-5985  Maternity Homes: - [facility] (336) 275-9566 - [facility] (704) 372-4663  MRSA Hotline #:   832-7006  [place] Resources  [facility] [place]  [employer] [place] [facility] [address]                 [address]         [address]  [place]                                               [place]                              [place] Phone:  [phone]                                  Phone:  [phone]                   Phone:  [phone]  [place] [facility], [phone] - [place] Services - [facility]- 1-888-581-9988       -     [facility] in [place], [address],                                  336-349-4454, Insurance  [place] Child Abuse Hotline (336) 342-1394 or (336) 342-3537 (After Hours)   Behavioral Health Services  Substance Abuse Resources: - [facility]  336-882-2125 - [facility] 336-784-9470 - [facility] 336-285-9073 - [facility] 336-845-3988 - Residential & Outpatient Substance Abuse Program  800-659-3381  Psychological Services: -  Health  [phone] - [facility]  [phone] - [place] [facility], [address], [place], ACCESS LINE: 1-800-853-5163 or 336-641-4981, Http://www.[facility].com/services/adult.htm  Dental Assistance  If unable to pay or uninsured, contact:  [facility] or [place] [facility] to become qualified for the adult dental  clinic.  Patients with Medicaid:  [facility] [address], [phone], [phone]  If unable   to pay, or uninsured, contact [facility] 260-222-2575) or [facility] Department [phone] in [place], [phone] in [facility]) to become qualified for the adult dental clinic  Other Low-Cost Community Dental Services: - [facility]- [address] [place], [place], [place], [address], [phone], [address]. [phone], 2nd and 4th Thursday of the month at 6:30am.  10 clients each day by appointment, can sometimes see walk-in patients if someone does not show for an appointment. [facility]- [address] [facility] [place], [place], [address], [phone] - [facility]- [address], [place], [place], [address], [phone] [facility]- [phone] [facility]- [phone] [facility] [phone]     Take over the counter sudafed, as directed on packaging, for the next week.  Use over the counter normal saline nasal spray, as instructed in the Emergency Department, several times per day for the next 2 weeks.  Take over the counter tylenol, as directed on packaging, as needed for discomfort.  Take the prescription as directed.  Use the auralgan ear drops:  2 to 4 drops in your left ear 4 times per day as needed for pain.  Call your regular medical doctor tomorrow to schedule a follow up appointment this week.  Return to the Emergency Department immediately if worsening.

## 2012-10-17 ENCOUNTER — Emergency Department (HOSPITAL_COMMUNITY)
Admission: EM | Admit: 2012-10-17 | Discharge: 2012-10-17 | Disposition: A | Discharge status: Home / Self Care | Payer: Medicaid Other | Attending: Emergency Medicine | Admitting: Emergency Medicine

## 2012-10-17 ENCOUNTER — Encounter (HOSPITAL_COMMUNITY): Payer: Self-pay | Admitting: Emergency Medicine

## 2012-10-17 ENCOUNTER — Emergency Department (HOSPITAL_COMMUNITY): Payer: Medicaid Other

## 2012-10-17 DIAGNOSIS — F17200 Nicotine dependence, unspecified, uncomplicated: Secondary | ICD-10-CM | POA: Insufficient documentation

## 2012-10-17 DIAGNOSIS — R109 Unspecified abdominal pain: Secondary | ICD-10-CM | POA: Insufficient documentation

## 2012-10-17 DIAGNOSIS — Z79899 Other long term (current) drug therapy: Secondary | ICD-10-CM | POA: Insufficient documentation

## 2012-10-17 DIAGNOSIS — Z862 Personal history of diseases of the blood and blood-forming organs and certain disorders involving the immune mechanism: Secondary | ICD-10-CM | POA: Insufficient documentation

## 2012-10-17 LAB — CBC WITH DIFFERENTIAL/PLATELET
Basophils Absolute: 0.1 10*3/uL (ref 0.0–0.1)
Eosinophils Absolute: 0.3 10*3/uL (ref 0.0–0.7)
HCT: 35.9 % — ABNORMAL LOW (ref 36.0–46.0)
Lymphs Abs: 3.1 10*3/uL (ref 0.7–4.0)
MCH: 19.8 pg — ABNORMAL LOW (ref 26.0–34.0)
MCHC: 32.3 g/dL (ref 30.0–36.0)
MCV: 61.4 fL — ABNORMAL LOW (ref 78.0–100.0)
Monocytes Relative: 5 % (ref 3–12)
Neutro Abs: 4.1 10*3/uL (ref 1.7–7.7)
RDW: 15.1 % (ref 11.5–15.5)

## 2012-10-17 LAB — URINALYSIS, MICROSCOPIC ONLY
Ketones, ur: NEGATIVE mg/dL
Nitrite: NEGATIVE
Protein, ur: NEGATIVE mg/dL
pH: 6.5 (ref 5.0–8.0)

## 2012-10-17 LAB — COMPREHENSIVE METABOLIC PANEL
Alkaline Phosphatase: 59 U/L (ref 39–117)
BUN: 8 mg/dL (ref 6–23)
GFR calc Af Amer: >90 mL/min (ref >90)
Glucose, Bld: 91 mg/dL (ref 70–99)
Potassium: 4.1 mEq/L (ref 3.5–5.1)
Total Protein: 7.4 g/dL (ref 6.0–8.3)

## 2012-10-17 MED ORDER — SODIUM CHLORIDE 0.9 % IV BOLUS (SEPSIS)
1000.0000 mL | Freq: Once | INTRAVENOUS | Status: AC
  Administered 2012-10-17: 1000 mL via INTRAVENOUS

## 2012-10-17 MED ORDER — ONDANSETRON HCL 4 MG/2ML IJ SOLN
4.0000 mg | Freq: Once | INTRAMUSCULAR | Status: AC
  Administered 2012-10-17: 4 mg via INTRAVENOUS
  Filled 2012-10-17: qty 2

## 2012-10-17 MED ORDER — HYDROCODONE-ACETAMINOPHEN 5-325 MG PO TABS: 1.0000 | ORAL_TABLET | ORAL | Status: DC | PRN

## 2012-10-17 MED ORDER — MORPHINE SULFATE 4 MG/ML IJ SOLN
4.0000 mg | Freq: Once | INTRAMUSCULAR | Status: AC
  Administered 2012-10-17: 4 mg via INTRAVENOUS
  Filled 2012-10-17: qty 1

## 2012-10-17 MED ORDER — OMEPRAZOLE 20 MG PO CPDR: 40.0000 mg | DELAYED_RELEASE_CAPSULE | Freq: Every day | ORAL | Status: DC

## 2012-10-17 MED ORDER — ONDANSETRON 8 MG PO TBDP: ORAL_TABLET | ORAL | Status: DC

## 2012-10-17 MED ORDER — FAMOTIDINE 20 MG PO TABS: 20.0000 mg | ORAL_TABLET | Freq: Two times a day (BID) | ORAL | Status: DC

## 2012-10-17 NOTE — ED Provider Notes (Signed)
History     CSN: [id number]  Arrival date & time 10/17/12  0105   First MD Initiated Contact with Patient 10/17/12 0152      Chief Complaint  Patient presents with  . Abdominal Pain   HPI  History provided by the patient. Patient is a 31 year old female with history of cesarean section, appendectomy and tubal ligation who presents with complaints of mid and upper abdominal pain, cramping and discomfort for the past one to 2 weeks. Symptoms have been waxing and waning but patient reports a baseline underlying discomfort that is constant. Symptoms seem worse a short time after eating. She denies any other aggravating or alleviating factors. She has not used any medications for her symptoms. Patient denies having similar symptoms previously. Symptoms have not been associated with any fever, chills, sweats, nausea, vomiting or diarrhea. Patient denies any urinary complaints. No urinary frequency, hematuria or dysuria.    Past Medical History  Diagnosis Date  . Anemia     Past Surgical History  Procedure Date  . Cesarean section   . Appendectomy   . Tonsillectomy   . Cesarean section     x4  . Tubal ligation     History reviewed. No pertinent family history.  History  Substance Use Topics  . Smoking status: Current Some Day Smoker    Types: Cigarettes  . Smokeless tobacco: Never Used  . Alcohol Use: No    OB History    Grav Para Term Preterm Abortions TAB SAB Ect Mult Living                  Review of Systems  Constitutional: Negative for fever, chills and appetite change.  Respiratory: Negative for cough and shortness of breath.   Cardiovascular: Negative for chest pain.  Gastrointestinal: Positive for abdominal pain. Negative for nausea, vomiting, diarrhea and constipation.  Genitourinary: Negative for dysuria, frequency, hematuria, flank pain, vaginal bleeding and vaginal discharge.  Skin: Negative for rash.  All other systems reviewed and are  negative.    Allergies  Amoxicillin-pot clavulanate and Percocet  Home Medications   Current Outpatient Rx  Name  Route  Sig  Dispense  Refill  . BUTALBITAL-APAP-CAFFEINE 50-325-40 MG PO TABS   Oral   Take 1 tablet by mouth daily as needed. For migraines          . LEVONORGEST-ETH ESTRAD 91-DAY 0.15-0.03 &0.01 MG PO TABS   Oral   Take 1 tablet by mouth daily.           Marland Kitchen PAROXETINE HCL 10 MG PO TABS   Oral   Take 10 mg by mouth daily.         . TOPIRAMATE 25 MG PO TABS   Oral   Take 25 mg by mouth daily.          . TRAZODONE HCL 50 MG PO TABS   Oral   Take 50 mg by mouth at bedtime.         Marland Kitchen ONDANSETRON HCL 4 MG PO TABS   Oral   Take 4 mg by mouth every 8 (eight) hours as needed. Nausea           BP 158/91  Pulse 100  Temp 98.2 F (36.8 C) (Oral)  Resp 18  SpO2 100%  LMP 07/17/2012  Physical Exam  Nursing note and vitals reviewed. Constitutional: She is oriented to person, place, and time. She appears well-developed and well-nourished. No distress.  HENT:  Head: Normocephalic.  Cardiovascular: Normal rate and regular rhythm.   Pulmonary/Chest: Effort normal and breath sounds normal. No respiratory distress. She has no wheezes. She has no rales.  Abdominal: Soft. There is no hepatosplenomegaly. There is tenderness in the right upper quadrant. There is positive Murphy's sign. There is no rebound, no guarding and no CVA tenderness.       Obese  Neurological: She is alert and oriented to person, place, and time.  Skin: Skin is warm and dry.  Psychiatric: She has a normal mood and affect. Her behavior is normal.    ED Course  Procedures  Results for orders placed during the hospital encounter of 10/17/12  CBC WITH DIFFERENTIAL      Component Value Range   WBC 8.0  4.0 - 10.5 K/uL   RBC 5.85 (*) 3.87 - 5.11 MIL/uL   Hemoglobin 11.6 (*) 12.0 - 15.0 g/dL   HCT 16.1 (*) 09.6 - 04.5 %   MCV 61.4 (*) 78.0 - 100.0 fL   MCH 19.8 (*) 26.0 - 34.0 pg    MCHC 32.3  30.0 - 36.0 g/dL   RDW 40.9  81.1 - 91.4 %   Platelets 222  150 - 400 K/uL   Neutrophils Relative 51  43 - 77 %   Lymphocytes Relative 39  12 - 46 %   Monocytes Relative 5  3 - 12 %   Eosinophils Relative 4  0 - 5 %   Basophils Relative 1  0 - 1 %   Neutro Abs 4.1  1.7 - 7.7 K/uL   Lymphs Abs 3.1  0.7 - 4.0 K/uL   Monocytes Absolute 0.4  0.1 - 1.0 K/uL   Eosinophils Absolute 0.3  0.0 - 0.7 K/uL   Basophils Absolute 0.1  0.0 - 0.1 K/uL   RBC Morphology BASOPHILIC STIPPLING     Smear Review LARGE PLATELETS PRESENT    COMPREHENSIVE METABOLIC PANEL      Component Value Range   Sodium 138  135 - 145 mEq/L   Potassium 4.1  3.5 - 5.1 mEq/L   Chloride 103  96 - 112 mEq/L   CO2 25  19 - 32 mEq/L   Glucose, Bld 91  70 - 99 mg/dL   BUN 8  6 - 23 mg/dL   Creatinine, Ser 7.82  0.50 - 1.10 mg/dL   Calcium 9.4  8.4 - 95.6 mg/dL   Total Protein 7.4  6.0 - 8.3 g/dL   Albumin 3.9  3.5 - 5.2 g/dL   AST 14  0 - 37 U/L   ALT 9  0 - 35 U/L   Alkaline Phosphatase 59  39 - 117 U/L   Total Bilirubin 0.3  0.3 - 1.2 mg/dL   GFR calc non Af Amer >90  >90 mL/min   GFR calc Af Amer >90  >90 mL/min  URINALYSIS, MICROSCOPIC ONLY      Component Value Range   Color, Urine YELLOW  YELLOW   APPearance CLEAR  CLEAR   Specific Gravity, Urine 1.014  1.005 - 1.030   pH 6.5  5.0 - 8.0   Glucose, UA NEGATIVE  NEGATIVE mg/dL   Hgb urine dipstick NEGATIVE  NEGATIVE   Bilirubin Urine NEGATIVE  NEGATIVE   Ketones, ur NEGATIVE  NEGATIVE mg/dL   Protein, ur NEGATIVE  NEGATIVE mg/dL   Urobilinogen, UA 1.0  0.0 - 1.0 mg/dL   Nitrite NEGATIVE  NEGATIVE   Leukocytes, UA SMALL (*) NEGATIVE   WBC, UA 3-6  <3  WBC/hpf   RBC / HPF 0-2  <3 RBC/hpf   Bacteria, UA RARE  RARE   Squamous Epithelial / LPF FEW (*) RARE  LIPASE, BLOOD      Component Value Range   Lipase 41  11 - 59 U/L  POCT PREGNANCY, URINE      Component Value Range   Preg Test, Ur NEGATIVE  NEGATIVE       US Abdomen  Complete  10/17/2012  *RADIOLOGY REPORT*  Clinical Data:  Abdominal pain.  Nausea and vomiting.  COMPLETE ABDOMINAL ULTRASOUND  Comparison:  CT abdomen and pelvis 10/07/2011  Findings:  Gallbladder:  No gallstones, gallbladder wall thickening, or pericholecystic fluid.  Common bile duct:  Normal caliber with measured diameter of 3.6 mm.  Liver:  No focal lesion identified.  Within normal limits in parenchymal echogenicity.  IVC:  Appears normal.  Pancreas:  No focal abnormality seen.  Spleen:  Spleen length measures 9.8 cm.  Homogeneous parenchymal echotexture.  Right Kidney:  Right kidney measures 12.9 cm length.  No hydronephrosis.  Left Kidney:  Left kidney measures 12.7 cm length.  No hydronephrosis.  Abdominal aorta:  No aneurysm identified.  IMPRESSION: Negative abdominal ultrasound.   Original Report Authenticated By: Burman Nieves, M.D.      1. Abdominal pain       MDM  1:50AM patient seen and evaluated. Patient appears well in no acute distress. She has some mild discomforts.  She feels better after medications. Her lab tests and ultrasound are unremarkable today. Symptoms have been present for the past 10 days. At this time I doubt any acute process. Patient has been given GI referral for further evaluation of upper abdominal symptoms. We'll plan to start patient on antacid medications. She agrees with this plan and is ready to return home.        Angus Seller, [place] 10/17/12 [phone]

## 2012-10-17 NOTE — ED Notes (Signed)
Pt transported to US

## 2012-10-17 NOTE — ED Notes (Signed)
Pt reports since Sunday has been having central abd pain described as cramping/stabbing with n/v; denies dysuria, diarrhea

## 2012-10-17 NOTE — ED Notes (Signed)
Pt remains in US

## 2012-10-18 NOTE — ED Provider Notes (Signed)
Medical screening examination/treatment/procedure(s) were performed by non-physician practitioner and as supervising physician I was immediately available for consultation/collaboration.  Jasmine Awe, MD 10/18/12 [phone]

## 2012-11-22 ENCOUNTER — Encounter (HOSPITAL_COMMUNITY): Payer: Self-pay | Admitting: *Deleted

## 2012-11-22 ENCOUNTER — Emergency Department (HOSPITAL_COMMUNITY)
Admission: EM | Admit: 2012-11-22 | Discharge: 2012-11-22 | Disposition: A | Discharge status: Home / Self Care | Payer: Medicaid Other | Attending: Emergency Medicine | Admitting: Emergency Medicine

## 2012-11-22 ENCOUNTER — Emergency Department (HOSPITAL_COMMUNITY): Payer: Medicaid Other

## 2012-11-22 DIAGNOSIS — Z79899 Other long term (current) drug therapy: Secondary | ICD-10-CM | POA: Insufficient documentation

## 2012-11-22 DIAGNOSIS — M25579 Pain in unspecified ankle and joints of unspecified foot: Secondary | ICD-10-CM | POA: Insufficient documentation

## 2012-11-22 DIAGNOSIS — Z862 Personal history of diseases of the blood and blood-forming organs and certain disorders involving the immune mechanism: Secondary | ICD-10-CM | POA: Insufficient documentation

## 2012-11-22 DIAGNOSIS — M25561 Pain in right knee: Secondary | ICD-10-CM

## 2012-11-22 DIAGNOSIS — F17200 Nicotine dependence, unspecified, uncomplicated: Secondary | ICD-10-CM | POA: Insufficient documentation

## 2012-11-22 MED ORDER — IBUPROFEN 600 MG PO TABS: 600.0000 mg | ORAL_TABLET | Freq: Four times a day (QID) | ORAL | Status: DC | PRN

## 2012-11-22 MED ORDER — IBUPROFEN 400 MG PO TABS
800.0000 mg | ORAL_TABLET | Freq: Once | ORAL | Status: AC
  Administered 2012-11-22: 800 mg via ORAL
  Filled 2012-11-22: qty 2

## 2012-11-22 NOTE — ED Notes (Signed)
The pt has had rt knee pain for one week.  No known injury.  No previous history,  The Clance Boll is swollen

## 2012-11-22 NOTE — ED Provider Notes (Signed)
Medical screening examination/treatment/procedure(s) were performed by non-physician practitioner and as supervising physician I was immediately available for consultation/collaboration.   Labrian Torregrossa T Chandon Lazcano, MD 11/22/12 2219 

## 2012-11-22 NOTE — ED Provider Notes (Signed)
History     CSN: [id number]  Arrival date & time 11/22/12  4098   First MD Initiated Contact with Patient 11/22/12 2037      Chief Complaint  Patient presents with  . Knee Pain    (Consider location/radiation/quality/duration/timing/severity/associated sxs/prior treatment) HPI The patient presents to the emergency department with complaints of right knee pain for one week. She denies having any injury but feels that the knee is swollen. She is a smoker and takes birth control. She denies being sedentary allergies.young kids that she is chasing after. She denies noticing any bruising, warmth, redness or the inability to apply weight to her knee. She denies having history of gout or any previous knee injuries to the area. nad vss  Past Medical History  Diagnosis Date  . Anemia     Past Surgical History  Procedure Date  . Cesarean section   . Appendectomy   . Tonsillectomy   . Cesarean section     x4  . Tubal ligation     No family history on file.  History  Substance Use Topics  . Smoking status: Current Some Day Smoker    Types: Cigarettes  . Smokeless tobacco: Never Used  . Alcohol Use: No    OB History    Grav Para Term Preterm Abortions TAB SAB Ect Mult Living                  Review of Systems   Review of Systems  Gen: no weight loss, fevers, chills, night sweats  Eyes: no discharge or drainage, no occular pain or visual changes  Nose: no epistaxis or rhinorrhea  Mouth: no dental pain, no sore throat  Neck: no neck pain  MSK:  Right knee pain Neuro: no headache, no focal neurologic deficits  Skin: no abnormalities Psyche: negative.    Allergies  Amoxicillin-pot clavulanate and Percocet  Home Medications   Current Outpatient Rx  Name  Route  Sig  Dispense  Refill  . BUTALBITAL-APAP-CAFFEINE 50-325-40 MG PO TABS   Oral   Take 1 tablet by mouth daily as needed. For migraines          . FAMOTIDINE 20 MG PO TABS   Oral   Take 1 tablet (20  mg total) by mouth 2 (two) times daily.   30 tablet   0   . LEVONORGEST-ETH ESTRAD 91-DAY 0.15-0.03 &0.01 MG PO TABS   Oral   Take 1 tablet by mouth daily.           Marland Kitchen OMEPRAZOLE 20 MG PO CPDR   Oral   Take 2 capsules (40 mg total) by mouth daily.   40 capsule   0   . TOPIRAMATE 25 MG PO TABS   Oral   Take 25 mg by mouth daily.          . TRAZODONE HCL 50 MG PO TABS   Oral   Take 50 mg by mouth at bedtime.         . IBUPROFEN 600 MG PO TABS   Oral   Take 1 tablet (600 mg total) by mouth every 6 (six) hours as needed for pain.   30 tablet   0     BP 143/85  Temp 98.3 F (36.8 C) (Oral)  Resp 22  SpO2 100%  LMP 10/19/2012  Physical Exam  Nursing note and vitals reviewed. Constitutional: She appears well-developed and well-nourished. No distress.  HENT:  Head: Normocephalic and atraumatic.  Eyes: Pupils  are equal, round, and reactive to light.  Neck: Normal range of motion. Neck supple.  Cardiovascular: Normal rate and regular rhythm.   Pulmonary/Chest: Effort normal.  Abdominal: Soft.  Musculoskeletal:       Right knee: She exhibits swelling (mild swelling\). She exhibits normal range of motion, no effusion, no ecchymosis, no deformity, no laceration and no erythema. tenderness (mild diffuse tenderness. Knee without induration) found.  Neurological: She is alert.  Skin: Skin is warm and dry.    ED Course  Procedures (including critical care time)  Labs Reviewed - No data to display Dg Knee Complete 4 Views Right  11/22/2012  *RADIOLOGY REPORT*  Clinical Data: Knee pain.  RIGHT KNEE - COMPLETE 4+ VIEW  Comparison: None.  Findings: No joint effusion or fracture.  No degenerative changes.  IMPRESSION: Negative.   Original Report Authenticated By: Leanna Battles, M.D.      1. Knee pain, right       MDM  Most likely knee sprain but unable to ro DVT which is a possibility.  [place] lower extremity duplex ordered. Pt to come back in the morning. Low  suspicion so no lovenox given.  Knee sleeve and Ibuprofen ordered with Ortho follow-up if DVT test comes back negative.  Pt has been advised of the symptoms that warrant their return to the ED. Patient has voiced understanding and has agreed to follow-up with the PCP or specialist.         Dorthula Matas, PA 11/22/12 2128

## 2012-11-22 NOTE — Progress Notes (Signed)
Orthopedic Tech Progress Note Patient Details:  Crystal Myers [birth year] [id number]  Ortho Devices Type of Ortho Device: Knee Sleeve Ortho Device/Splint Location: right LE Ortho Device/Splint Interventions: Application   Rommie Dunn T 11/22/2012, 9:49 PM

## 2012-11-22 NOTE — ED Notes (Signed)
Pt taken to x-ray. X-ray tech will bring pt back to TR05

## 2012-11-23 ENCOUNTER — Ambulatory Visit (HOSPITAL_COMMUNITY): Admission: RE | Admit: 2012-11-23 | Payer: Medicaid Other | Source: Ambulatory Visit

## 2012-11-23 ENCOUNTER — Other Ambulatory Visit (HOSPITAL_COMMUNITY): Payer: Self-pay | Admitting: Emergency Medicine

## 2012-11-23 ENCOUNTER — Ambulatory Visit (HOSPITAL_COMMUNITY)
Admission: RE | Admit: 2012-11-23 | Discharge: 2012-11-23 | Disposition: A | Discharge status: Home / Self Care | Payer: Medicaid Other | Source: Ambulatory Visit | Attending: Emergency Medicine | Admitting: Emergency Medicine

## 2012-11-23 DIAGNOSIS — M79606 Pain in leg, unspecified: Secondary | ICD-10-CM

## 2012-11-23 DIAGNOSIS — M79609 Pain in unspecified limb: Secondary | ICD-10-CM | POA: Insufficient documentation

## 2012-11-23 NOTE — Progress Notes (Signed)
*  PRELIMINARY RESULTS* Vascular Ultrasound Right lower extremity venous duplex has been completed.  Preliminary findings: Right:  No evidence of DVT, superficial thrombosis, or Baker's cyst.   Farrel Demark, RDMS, RVT 11/23/2012, 3:33 PM

## 2012-11-28 ENCOUNTER — Emergency Department (HOSPITAL_COMMUNITY)
Admission: EM | Admit: 2012-11-28 | Discharge: 2012-11-28 | Disposition: A | Discharge status: Home / Self Care | Payer: Medicaid Other | Attending: Emergency Medicine | Admitting: Emergency Medicine

## 2012-11-28 ENCOUNTER — Encounter (HOSPITAL_COMMUNITY): Payer: Self-pay | Admitting: Emergency Medicine

## 2012-11-28 ENCOUNTER — Emergency Department (HOSPITAL_COMMUNITY): Payer: Medicaid Other

## 2012-11-28 DIAGNOSIS — R102 Pelvic and perineal pain: Secondary | ICD-10-CM

## 2012-11-28 DIAGNOSIS — N949 Unspecified condition associated with female genital organs and menstrual cycle: Secondary | ICD-10-CM | POA: Insufficient documentation

## 2012-11-28 DIAGNOSIS — F17200 Nicotine dependence, unspecified, uncomplicated: Secondary | ICD-10-CM | POA: Insufficient documentation

## 2012-11-28 DIAGNOSIS — Z79899 Other long term (current) drug therapy: Secondary | ICD-10-CM | POA: Insufficient documentation

## 2012-11-28 DIAGNOSIS — R112 Nausea with vomiting, unspecified: Secondary | ICD-10-CM | POA: Insufficient documentation

## 2012-11-28 DIAGNOSIS — Z862 Personal history of diseases of the blood and blood-forming organs and certain disorders involving the immune mechanism: Secondary | ICD-10-CM | POA: Insufficient documentation

## 2012-11-28 DIAGNOSIS — Z3202 Encounter for pregnancy test, result negative: Secondary | ICD-10-CM | POA: Insufficient documentation

## 2012-11-28 LAB — CBC WITH DIFFERENTIAL/PLATELET
Basophils Absolute: 0 10*3/uL (ref 0.0–0.1)
Basophils Relative: 0 % (ref 0–1)
Eosinophils Absolute: 0.3 10*3/uL (ref 0.0–0.7)
Hemoglobin: 12.6 g/dL (ref 12.0–15.0)
Lymphocytes Relative: 10 % — ABNORMAL LOW (ref 12–46)
MCHC: 32.4 g/dL (ref 30.0–36.0)
Monocytes Relative: 3 % (ref 3–12)
Neutrophils Relative %: 85 % — ABNORMAL HIGH (ref 43–77)
RDW: 15.8 % — ABNORMAL HIGH (ref 11.5–15.5)
WBC: 12.7 10*3/uL — ABNORMAL HIGH (ref 4.0–10.5)

## 2012-11-28 LAB — COMPREHENSIVE METABOLIC PANEL
ALT: 10 U/L (ref 0–35)
AST: 11 U/L (ref 0–37)
Calcium: 9.2 mg/dL (ref 8.4–10.5)
Sodium: 138 mEq/L (ref 135–145)
Total Protein: 7.6 g/dL (ref 6.0–8.3)

## 2012-11-28 LAB — URINALYSIS, ROUTINE W REFLEX MICROSCOPIC
Glucose, UA: NEGATIVE mg/dL
Hgb urine dipstick: NEGATIVE
Specific Gravity, Urine: 1.03 (ref 1.005–1.030)
pH: 5 (ref 5.0–8.0)

## 2012-11-28 LAB — WET PREP, GENITAL
Clue Cells Wet Prep HPF POC: NONE SEEN
Trich, Wet Prep: NONE SEEN

## 2012-11-28 LAB — POCT PREGNANCY, URINE: Preg Test, Ur: NEGATIVE

## 2012-11-28 LAB — URINE MICROSCOPIC-ADD ON

## 2012-11-28 MED ORDER — HYDROMORPHONE HCL PF 1 MG/ML IJ SOLN
1.0000 mg | Freq: Once | INTRAMUSCULAR | Status: AC
  Administered 2012-11-28: 1 mg via INTRAVENOUS
  Filled 2012-11-28: qty 1

## 2012-11-28 MED ORDER — HYDROCODONE-ACETAMINOPHEN 5-325 MG PO TABS: ORAL_TABLET | ORAL | Status: DC

## 2012-11-28 MED ORDER — ONDANSETRON HCL 4 MG/2ML IJ SOLN
4.0000 mg | Freq: Once | INTRAMUSCULAR | Status: AC
  Administered 2012-11-28: 4 mg via INTRAVENOUS
  Filled 2012-11-28: qty 2

## 2012-11-28 MED ORDER — SODIUM CHLORIDE 0.9 % IV SOLN
Freq: Once | INTRAVENOUS | Status: AC
  Administered 2012-11-28: 16:00:00 via INTRAVENOUS

## 2012-11-28 MED ORDER — MORPHINE SULFATE 4 MG/ML IJ SOLN
6.0000 mg | Freq: Once | INTRAMUSCULAR | Status: AC
  Administered 2012-11-28: 6 mg via INTRAVENOUS
  Filled 2012-11-28: qty 2

## 2012-11-28 MED ORDER — HYDROCODONE-ACETAMINOPHEN 5-325 MG PO TABS
1.0000 | ORAL_TABLET | Freq: Once | ORAL | Status: AC
  Administered 2012-11-28: 1 via ORAL
  Filled 2012-11-28: qty 1

## 2012-11-28 MED ORDER — HYOSCYAMINE SULFATE 0.125 MG PO TABS
0.2500 mg | ORAL_TABLET | Freq: Once | ORAL | Status: AC
  Administered 2012-11-28: 0.25 mg via ORAL
  Filled 2012-11-28: qty 2

## 2012-11-28 MED ORDER — ONDANSETRON 8 MG PO TBDP: 8.0000 mg | ORAL_TABLET | Freq: Three times a day (TID) | ORAL | Status: DC | PRN

## 2012-11-28 NOTE — ED Provider Notes (Signed)
History     CSN: [id number]  Arrival date & time 11/28/12  1015   First MD Initiated Contact with Patient 11/28/12 1030      Chief Complaint  Patient presents with  . Abdominal Pain    (Consider location/radiation/quality/duration/timing/severity/associated sxs/prior treatment) HPI Comments: Patient with history of appendectomy, c-section presents with acute onset of severe abdominal pain in the periumbilical area and approximately 6:30 AM today. It has been associated with one episode of vomiting. No fevers. No change in bowel movements. No urinary symptoms. Pain is described as sharp. It does not radiate. No treatments prior to arrival. No vaginal bleeding or discharge. Patient was seen in November 2013 with similar, but less severe symptoms. She had a normal abdominal ultrasound and was started on Pepcid and Prilosec. Pt is sexually active, in a monogamous relationship x 8 yrs. She took this without any relief. She was given a GI referral which she did not followup. Onset of symptoms acute. Course is constant. Nothing makes symptoms better. Palpation makes symptoms worse.  Patient is a 32 y.o. female presenting with abdominal pain. The history is provided by the patient.  Abdominal Pain The primary symptoms of the illness include abdominal pain, nausea and vomiting. The primary symptoms of the illness do not include fever, diarrhea, dysuria or vaginal discharge.    Past Medical History  Diagnosis Date  . Anemia     Past Surgical History  Procedure Date  . Cesarean section   . Appendectomy   . Tonsillectomy   . Cesarean section     x4  . Tubal ligation     No family history on file.  History  Substance Use Topics  . Smoking status: Current Some Day Smoker    Types: Cigarettes  . Smokeless tobacco: Never Used  . Alcohol Use: No    OB History    Grav Para Term Preterm Abortions TAB SAB Ect Mult Living                  Review of Systems  Constitutional: Negative  for fever.  HENT: Negative for sore throat and rhinorrhea.   Eyes: Negative for redness.  Respiratory: Negative for cough.   Cardiovascular: Negative for chest pain.  Gastrointestinal: Positive for nausea, vomiting and abdominal pain. Negative for diarrhea.  Genitourinary: Negative for dysuria, vaginal discharge and vaginal pain.  Musculoskeletal: Negative for myalgias.  Skin: Negative for rash.  Neurological: Negative for headaches.    Allergies  Amoxicillin-pot clavulanate and Percocet  Home Medications   Current Outpatient Rx  Name  Route  Sig  Dispense  Refill  . BUTALBITAL-APAP-CAFFEINE 50-325-40 MG PO TABS   Oral   Take 1 tablet by mouth daily as needed. For migraines          . FAMOTIDINE 20 MG PO TABS   Oral   Take 1 tablet (20 mg total) by mouth 2 (two) times daily.   30 tablet   0   . IBUPROFEN 600 MG PO TABS   Oral   Take 1 tablet (600 mg total) by mouth every 6 (six) hours as needed for pain.   30 tablet   0   . LEVONORGEST-ETH ESTRAD 91-DAY 0.15-0.03 &0.01 MG PO TABS   Oral   Take 1 tablet by mouth daily.           Marland Kitchen OMEPRAZOLE 20 MG PO CPDR   Oral   Take 2 capsules (40 mg total) by mouth daily.   40 capsule  0   . TOPIRAMATE 25 MG PO TABS   Oral   Take 25 mg by mouth daily.          . TRAZODONE HCL 50 MG PO TABS   Oral   Take 50 mg by mouth at bedtime.           BP 125/80  Pulse 88  Temp 98.3 F (36.8 C) (Oral)  Resp 16  SpO2 99%  LMP 10/19/2012  Physical Exam  Nursing note and vitals reviewed. Constitutional: She appears well-developed and well-nourished.       Pt appears uncomfortable  HENT:  Head: Normocephalic and atraumatic.  Eyes: Conjunctivae normal are normal. Right eye exhibits no discharge. Left eye exhibits no discharge.  Neck: Normal range of motion. Neck supple.  Cardiovascular: Normal rate, regular rhythm and normal heart sounds.   Pulmonary/Chest: Effort normal and breath sounds normal.  Abdominal: Soft.  Bowel sounds are normal. There is tenderness. There is no rigidity, no rebound, no guarding, no CVA tenderness, no tenderness at McBurney's point and negative Murphy's sign.    Genitourinary: Uterus is not tender. Cervix exhibits motion tenderness (mild). Cervix exhibits no discharge (cervix posterior and difficult to visualize). Right adnexum displays tenderness. Right adnexum displays no mass and no fullness. Left adnexum displays no mass, no tenderness and no fullness. No erythema or tenderness around the vagina. Vaginal discharge found.  Neurological: She is alert.  Skin: Skin is warm and dry.  Psychiatric: She has a normal mood and affect.    ED Course  Procedures (including critical care time)  Labs Reviewed  CBC WITH DIFFERENTIAL - Abnormal; Notable for the following:    WBC 12.7 (*)     RBC 6.41 (*)     MCV 60.7 (*)     MCH 19.7 (*)     RDW 15.8 (*)     Neutrophils Relative 85 (*)     Lymphocytes Relative 10 (*)     Neutro Abs 10.7 (*)     All other components within normal limits  URINALYSIS, ROUTINE W REFLEX MICROSCOPIC - Abnormal; Notable for the following:    Color, Urine AMBER (*)  BIOCHEMICALS MAY BE AFFECTED BY COLOR   APPearance CLOUDY (*)     Leukocytes, UA SMALL (*)     All other components within normal limits  CG4 I-STAT (LACTIC ACID) - Abnormal; Notable for the following:    Lactic Acid, Venous 2.28 (*)     All other components within normal limits  WET PREP, GENITAL - Abnormal; Notable for the following:    WBC, Wet Prep HPF POC FEW (*)     All other components within normal limits  COMPREHENSIVE METABOLIC PANEL  LIPASE, BLOOD  POCT PREGNANCY, URINE  URINE MICROSCOPIC-ADD ON  GC/CHLAMYDIA PROBE AMP   US Transvaginal Non-ob  11/28/2012  *RADIOLOGY REPORT*  Clinical Data:  Right lower quadrant pelvic pain.  Clinical concern for ovarian torsion.  TRANSABDOMINAL AND TRANSVAGINAL ULTRASOUND OF PELVIS DOPPLER ULTRASOUND OF OVARIES  Technique:  Both  transabdominal and transvaginal ultrasound examinations of the pelvis were performed. Transabdominal technique was performed for global imaging of the pelvis including uterus, ovaries, adnexal regions, and pelvic cul-de-sac.  It was necessary to proceed with endovaginal exam following the transabdominal exam to visualize the uterus and ovaries.  Color and duplex Doppler ultrasound was utilized to evaluate blood flow to the ovaries.  Comparison:  CT 10/07/2011  Findings:  Uterus:  Anteverted, anteflexed.  9.1 x 5.8 x 4.6  cm.  No focal abnormality.  Endometrium:  4 mm.  Endometrial canal fluid incidentally noted. No endometrial focal abnormality.  Right ovary: 2.8 x 1.4 x 1.3 cm.  The ovary is normal in appearance.  There is a cystic structure in the right adnexa, separate from the ovaries, which measures 2.4 x 2.2 x 1.9 cm.  Left ovary:     3.1 x 2.2 x 1.9 cm.  Normal appearance/no adnexal mass  Pulsed Doppler evaluation demonstrates normal low-resistance arterial and venous waveforms in both ovaries.  IMPRESSION: No sonographic evidence for torsion in either ovary.  Right adnexal cystic structure, for which differential considerations include paraovarian cyst, lymphangioma, enteric duplication cyst, less likely endometrioma or tubal ovarian abscess.  Outpatient nonemergent evaluation with MRI pelvis with contrast is recommended.   Original Report Authenticated By: Christiana Pellant, M.D.    US Pelvis Complete  11/28/2012  *RADIOLOGY REPORT*  Clinical Data:  Right lower quadrant pelvic pain.  Clinical concern for ovarian torsion.  TRANSABDOMINAL AND TRANSVAGINAL ULTRASOUND OF PELVIS DOPPLER ULTRASOUND OF OVARIES  Technique:  Both transabdominal and transvaginal ultrasound examinations of the pelvis were performed. Transabdominal technique was performed for global imaging of the pelvis including uterus, ovaries, adnexal regions, and pelvic cul-de-sac.  It was necessary to proceed with endovaginal exam following the  transabdominal exam to visualize the uterus and ovaries.  Color and duplex Doppler ultrasound was utilized to evaluate blood flow to the ovaries.  Comparison:  CT 10/07/2011  Findings:  Uterus:  Anteverted, anteflexed.  9.1 x 5.8 x 4.6 cm.  No focal abnormality.  Endometrium:  4 mm.  Endometrial canal fluid incidentally noted. No endometrial focal abnormality.  Right ovary: 2.8 x 1.4 x 1.3 cm.  The ovary is normal in appearance.  There is a cystic structure in the right adnexa, separate from the ovaries, which measures 2.4 x 2.2 x 1.9 cm.  Left ovary:     3.1 x 2.2 x 1.9 cm.  Normal appearance/no adnexal mass  Pulsed Doppler evaluation demonstrates normal low-resistance arterial and venous waveforms in both ovaries.  IMPRESSION: No sonographic evidence for torsion in either ovary.  Right adnexal cystic structure, for which differential considerations include paraovarian cyst, lymphangioma, enteric duplication cyst, less likely endometrioma or tubal ovarian abscess.  Outpatient nonemergent evaluation with MRI pelvis with contrast is recommended.   Original Report Authenticated By: Christiana Pellant, M.D.    [place] Art/ven Flow Abd Pelv Doppler  11/28/2012  *RADIOLOGY REPORT*  Clinical Data:  Right lower quadrant pelvic pain.  Clinical concern for ovarian torsion.  TRANSABDOMINAL AND TRANSVAGINAL ULTRASOUND OF PELVIS DOPPLER ULTRASOUND OF OVARIES  Technique:  Both transabdominal and transvaginal ultrasound examinations of the pelvis were performed. Transabdominal technique was performed for global imaging of the pelvis including uterus, ovaries, adnexal regions, and pelvic cul-de-sac.  It was necessary to proceed with endovaginal exam following the transabdominal exam to visualize the uterus and ovaries.  Color and duplex Doppler ultrasound was utilized to evaluate blood flow to the ovaries.  Comparison:  CT 10/07/2011  Findings:  Uterus:  Anteverted, anteflexed.  9.1 x 5.8 x 4.6 cm.  No focal abnormality.  Endometrium:  4  mm.  Endometrial canal fluid incidentally noted. No endometrial focal abnormality.  Right ovary: 2.8 x 1.4 x 1.3 cm.  The ovary is normal in appearance.  There is a cystic structure in the right adnexa, separate from the ovaries, which measures 2.4 x 2.2 x 1.9 cm.  Left ovary:     3.1 x  2.2 x 1.9 cm.  Normal appearance/no adnexal mass  Pulsed Doppler evaluation demonstrates normal low-resistance arterial and venous waveforms in both ovaries.  IMPRESSION: No sonographic evidence for torsion in either ovary.  Right adnexal cystic structure, for which differential considerations include paraovarian cyst, lymphangioma, enteric duplication cyst, less likely endometrioma or tubal ovarian abscess.  Outpatient nonemergent evaluation with MRI pelvis with contrast is recommended.   Original Report Authenticated By: Christiana Pellant, M.D.      1. Pelvic pain     10:55 AM Patient seen and examined. Work-up initiated. Medications ordered. Previous abd [place] reviewed.   Vital signs reviewed and are as follows: Filed Vitals:   11/28/12 1035  BP: 125/80  Pulse: 88  Temp: 98.3 F (36.8 C)  Resp: 16   2:08 PM Patient's pain has been improved. On re-exam she has RLQ tenderness. Pelvic performed with nurse tech as chaperone. R adnexal tenderness. Transvaginal US ordered.   Transvaginal US results reviewed with Dr. Rhunette Croft. Pt informed.   Patient will need to follow-up with GYN for further evaluation of R adnexal cyst.  Symptoms controlled at time of discharge. Patient tolerates PO's and is ambulatory.   Patient counseled on use of narcotic pain medications. Counseled not to combine these medications with others containing tylenol. Urged not to drink alcohol, drive, or perform any other activities that requires focus while taking these medications. The patient verbalizes understanding and agrees with the plan.  The patient was urged to return to the Emergency Department immediately with worsening of current  symptoms, worsening abdominal pain, persistent vomiting, blood noted in stools, fever, or any other concerns. The patient verbalized understanding.     MDM  R adnexal cyst in non-pregnant patient. Do not feel this represents TOA: no fever, low risk by sexual history (monogamous partner x 8 yrs). No evidence of torsion. Do not suspect PID. Patient has prior appendectomy. Pattern of pain is more consistent with cyst. However, given unclear etiology, will need GYN f/u. Referrals given. No life threatening or dangerous etiologies suspected.         [place], [place] 11/29/12 [phone]

## 2012-11-28 NOTE — ED Notes (Signed)
Chaperoned Josh, PA with pelvic examination and collection of vaginal specimens

## 2012-11-28 NOTE — ED Notes (Signed)
Pt up ambulatory to the bathroom to attempt to provide an urine specimen

## 2012-11-28 NOTE — ED Notes (Signed)
Patient seen in ED recently for abdominal pain and was unable to follow up with GI Doctor.  Abdominal pain onset one day ago increased today 9/10 achy sharp.

## 2012-11-28 NOTE — ED Notes (Signed)
Waiting Levsin from pharmacy nausea and pain medication given IV currently patient feels nausea and emesis dry.

## 2012-11-29 ENCOUNTER — Encounter (HOSPITAL_COMMUNITY): Payer: Self-pay | Admitting: Family

## 2012-11-29 ENCOUNTER — Inpatient Hospital Stay (HOSPITAL_COMMUNITY)
Admission: AD | Admit: 2012-11-29 | Discharge: 2012-11-29 | Disposition: A | Discharge status: Home / Self Care | Payer: Medicaid Other | Source: Ambulatory Visit | Attending: Family Medicine | Admitting: Family Medicine

## 2012-11-29 DIAGNOSIS — R1110 Vomiting, unspecified: Secondary | ICD-10-CM

## 2012-11-29 DIAGNOSIS — N9489 Other specified conditions associated with female genital organs and menstrual cycle: Secondary | ICD-10-CM | POA: Insufficient documentation

## 2012-11-29 DIAGNOSIS — R109 Unspecified abdominal pain: Secondary | ICD-10-CM

## 2012-11-29 DIAGNOSIS — N83209 Unspecified ovarian cyst, unspecified side: Secondary | ICD-10-CM | POA: Insufficient documentation

## 2012-11-29 DIAGNOSIS — M549 Dorsalgia, unspecified: Secondary | ICD-10-CM

## 2012-11-29 HISTORY — DX: Chlamydial infection, unspecified: A74.9

## 2012-11-29 LAB — COMPREHENSIVE METABOLIC PANEL
ALT: 9 U/L (ref 0–35)
AST: 12 U/L (ref 0–37)
Albumin: 3.3 g/dL — ABNORMAL LOW (ref 3.5–5.2)
Alkaline Phosphatase: 57 U/L (ref 39–117)
Glucose, Bld: 90 mg/dL (ref 70–99)
Potassium: 3.4 mEq/L — ABNORMAL LOW (ref 3.5–5.1)
Sodium: 136 mEq/L (ref 135–145)
Total Protein: 6.7 g/dL (ref 6.0–8.3)

## 2012-11-29 LAB — URINALYSIS, ROUTINE W REFLEX MICROSCOPIC
Ketones, ur: 40 mg/dL — AB
Leukocytes, UA: NEGATIVE
Nitrite: NEGATIVE
pH: 5.5 (ref 5.0–8.0)

## 2012-11-29 LAB — CBC
Hemoglobin: 10.1 g/dL — ABNORMAL LOW (ref 12.0–15.0)
MCHC: 31.8 g/dL (ref 30.0–36.0)
Platelets: 186 10*3/uL (ref 150–400)
RDW: 15.6 % — ABNORMAL HIGH (ref 11.5–15.5)

## 2012-11-29 MED ORDER — KETOROLAC TROMETHAMINE 60 MG/2ML IM SOLN
60.0000 mg | Freq: Once | INTRAMUSCULAR | Status: AC
  Administered 2012-11-29: 60 mg via INTRAMUSCULAR
  Filled 2012-11-29: qty 2

## 2012-11-29 MED ORDER — KETOROLAC TROMETHAMINE 10 MG PO TABS: 10.0000 mg | ORAL_TABLET | Freq: Four times a day (QID) | ORAL | Status: DC | PRN

## 2012-11-29 NOTE — MAU Note (Signed)
Pt seen in [facility] last night with abd pain, dx'd with R ovarian cyst.  Told to come here if pain worsened.  Vomitting, unable to eat or drink.  Fever of 101.3 today @ 1100.  Pain has become progressively worse.

## 2012-11-29 NOTE — MAU Note (Signed)
Patient presents to MAU with c/o R abdominal pain 8/10 and lower back pain; was seen at [facility] ED yesterday where she was told she has a R ovarian cyst; was told to come here if s/s progressed.  Had fever of 101.3 today at 1100; 1000 mg of Tylenol taken at 1100. Hydrocodone and Zofran taken 1300. Denies vaginal bleeding or discharge.

## 2012-11-29 NOTE — ED Provider Notes (Signed)
Medical screening examination/treatment/procedure(s) were conducted as a shared visit with non-physician practitioner(s) and myself.  I personally evaluated the patient during the encounter  Dijuan Sleeth, MD 11/29/12 1204 

## 2012-11-29 NOTE — MAU Provider Note (Signed)
Chart reviewed and agree with management and plan.  

## 2012-11-29 NOTE — MAU Provider Note (Signed)
History     CSN: [id number]  Arrival date and time: 11/29/12 1622  Seen by provider at 1705 on 11-29-12    Chief Complaint  Patient presents with  . Abdominal Pain  . Emesis  . Fever   HPI Crystal Myers 32 y.o. Was seen at [facility] yesterday with severe constant abdominal pain and vomiting.  She was diagnosed with right adnexal mass - client reports right ovarian cyst.  Records reviewed.  Was given pain medication.  Has continued to have continuous abdominal pain and vomiting.  Additionally she has developed back pain.  Last took pain medication and medication for vomiting at 1 pm.  Has been sipping fluids - vomited last at 3 pm.  Has begun to have a fever.  Was instructed to come to [facility] hospital if she did not improve so she could be seen by GYN MD due to the adnexal mass.  Significant history of appendectomy and BTL.  Denies any dysuria.  OB History    Grav Para Term Preterm Abortions TAB SAB Ect Mult Living                  Past Medical History  Diagnosis Date  . Anemia     Past Surgical History  Procedure Date  . Cesarean section   . Appendectomy   . Tonsillectomy   . Cesarean section     x4  . Tubal ligation     No family history on file.  History  Substance Use Topics  . Smoking status: Current Some Day Smoker    Types: Cigarettes  . Smokeless tobacco: Never Used  . Alcohol Use: No    Allergies:  Allergies  Allergen Reactions  . Augmentin (Amoxicillin-Pot Clavulanate) Itching  . Percocet (Oxycodone-Acetaminophen) Itching    Prescriptions prior to admission  Medication Sig Dispense Refill  . butalbital-acetaminophen-caffeine (FIORICET, ESGIC) 50-325-40 MG per tablet Take 1 tablet by mouth daily as needed. For migraines       . famotidine (PEPCID) 20 MG tablet Take 1 tablet (20 mg total) by mouth 2 (two) times daily.  30 tablet  0  . HYDROcodone-acetaminophen (NORCO/VICODIN) 5-325 MG per tablet Take 1-2 tablets every 6 hours as needed for severe pain  15  tablet  0  . ibuprofen (ADVIL,MOTRIN) 600 MG tablet Take 1 tablet (600 mg total) by mouth every 6 (six) hours as needed for pain.  30 tablet  0  . Levonorgestrel-Ethinyl Estradiol (SEASONIQUE) 0.15-0.03 &0.01 MG tablet Take 1 tablet by mouth daily.        Marland Kitchen omeprazole (PRILOSEC) 20 MG capsule Take 2 capsules (40 mg total) by mouth daily.  40 capsule  0  . ondansetron (ZOFRAN ODT) 8 MG disintegrating tablet Take 1 tablet (8 mg total) by mouth every 8 (eight) hours as needed for nausea.  6 tablet  0  . topiramate (TOPAMAX) 25 MG tablet Take 25 mg by mouth daily.       . traZODone (DESYREL) 50 MG tablet Take 50 mg by mouth at bedtime.        Review of Systems  Constitutional: Positive for fever.  Gastrointestinal: Positive for nausea, vomiting and abdominal pain.  Genitourinary:       No vaginal discharge. No vaginal bleeding. No dysuria.  Musculoskeletal: Positive for back pain.   Physical Exam   Blood pressure 132/83, pulse 120, temperature 98.6 F (37 C), temperature source Oral, resp. rate 20, height 5\' 10"  (1.778 m), weight 101.969 kg (224 lb 12.8  oz), last menstrual period 10/19/2012.  Physical Exam  Nursing note and vitals reviewed. Constitutional: She is oriented to person, place, and time. She appears well-developed and well-nourished.  HENT:  Head: Normocephalic.  Eyes: EOM are normal.  Neck: Neck supple.  GI: Soft. Bowel sounds are normal. There is tenderness. There is guarding. There is no rebound.  Musculoskeletal: Normal range of motion.       Back Pain in low midline and right side of low back.  No CVA tenderness.  Neurological: She is alert and oriented to person, place, and time.  Skin: Skin is warm and dry.  Psychiatric: She has a normal mood and affect.    MAU Course  Procedures Results for orders placed during the hospital encounter of 11/29/12 (from the past 24 hour(s))  URINALYSIS, ROUTINE W REFLEX MICROSCOPIC     Status: Abnormal   Collection Time    11/29/12  4:40 PM      Component Value Range   Color, Urine YELLOW  YELLOW   APPearance CLEAR  CLEAR   Specific Gravity, Urine <1.005 (*) 1.005 - 1.030   pH 5.5  5.0 - 8.0   Glucose, UA NEGATIVE  NEGATIVE mg/dL   Hgb urine dipstick NEGATIVE  NEGATIVE   Bilirubin Urine NEGATIVE  NEGATIVE   Ketones, ur 40 (*) NEGATIVE mg/dL   Protein, ur NEGATIVE  NEGATIVE mg/dL   Urobilinogen, UA 0.2  0.0 - 1.0 mg/dL   Nitrite NEGATIVE  NEGATIVE   Leukocytes, UA NEGATIVE  NEGATIVE  POCT PREGNANCY, URINE     Status: Normal   Collection Time   11/29/12  5:20 PM      Component Value Range   Preg Test, Ur NEGATIVE  NEGATIVE  CBC     Status: Abnormal   Collection Time   11/29/12  5:35 PM      Component Value Range   WBC 10.5  4.0 - 10.5 K/uL   RBC 5.20 (*) 3.87 - 5.11 MIL/uL   Hemoglobin 10.1 (*) 12.0 - 15.0 g/dL   HCT 14.7 (*) 82.9 - 56.2 %   MCV 61.2 (*) 78.0 - 100.0 fL   MCH 19.4 (*) 26.0 - 34.0 pg   MCHC 31.8  30.0 - 36.0 g/dL   RDW 13.0 (*) 86.5 - 78.4 %   Platelets 186  150 - 400 K/uL  COMPREHENSIVE METABOLIC PANEL     Status: Abnormal   Collection Time   11/29/12  5:35 PM      Component Value Range   Sodium 136  135 - 145 mEq/L   Potassium 3.4 (*) 3.5 - 5.1 mEq/L   Chloride 102  96 - 112 mEq/L   CO2 22  19 - 32 mEq/L   Glucose, Bld 90  70 - 99 mg/dL   BUN 5 (*) 6 - 23 mg/dL   Creatinine, Ser 6.96  0.50 - 1.10 mg/dL   Calcium 8.7  8.4 - 29.5 mg/dL   Total Protein 6.7  6.0 - 8.3 g/dL   Albumin 3.3 (*) 3.5 - 5.2 g/dL   AST 12  0 - 37 U/L   ALT 9  0 - 35 U/L   Alkaline Phosphatase 57  39 - 117 U/L   Total Bilirubin 0.5  0.3 - 1.2 mg/dL   GFR calc non Af Amer >90  >90 mL/min   GFR calc Af Amer >90  >90 mL/min   MDM Given Toradol IM for pain and states pain has gone from 8/10 to 5/10.  Has Phenergan at home which she has not tried for vomiting.  States she has used Phenergan previously when she has migraine headaches.  Does not think she needs IVF today.    1810   Consult with Dr. Shawnie Pons re:  plan of care.  Reviewed ultrasound results.  Doubtful that current adnexal mass is causing client's pain.  Is possible that client has chronic hydrosalpinx associated with previous BTL.    Assessment and Plan  Abdominal pain - unknown cause Vomiting - possible GI upset Back pain  Plan Pain is less with Toradol IM.  No vomiting seen in MAU. Rx Toradol PO for pain control.  Advised not to take with any ibuprofen, advil or motrin. Follow up with doctor's office where she has been seen before for additional medical needs. BRAT diet today and advance slowly as you have been sick.   BURLESON,TERRI 11/29/2012, 5:09 PM

## 2012-11-30 LAB — CG4 I-STAT (LACTIC ACID): Lactic Acid, Venous: 2.28 mmol/L — ABNORMAL HIGH (ref 0.5–2.2)

## 2012-12-09 ENCOUNTER — Encounter (HOSPITAL_COMMUNITY): Payer: Self-pay | Admitting: Emergency Medicine

## 2012-12-09 ENCOUNTER — Emergency Department (HOSPITAL_COMMUNITY)
Admission: EM | Admit: 2012-12-09 | Discharge: 2012-12-09 | Disposition: A | Discharge status: Home / Self Care | Payer: Medicaid Other | Attending: Emergency Medicine | Admitting: Emergency Medicine

## 2012-12-09 DIAGNOSIS — Z9089 Acquired absence of other organs: Secondary | ICD-10-CM | POA: Insufficient documentation

## 2012-12-09 DIAGNOSIS — Z8619 Personal history of other infectious and parasitic diseases: Secondary | ICD-10-CM | POA: Insufficient documentation

## 2012-12-09 DIAGNOSIS — Z862 Personal history of diseases of the blood and blood-forming organs and certain disorders involving the immune mechanism: Secondary | ICD-10-CM | POA: Insufficient documentation

## 2012-12-09 DIAGNOSIS — F17200 Nicotine dependence, unspecified, uncomplicated: Secondary | ICD-10-CM | POA: Insufficient documentation

## 2012-12-09 DIAGNOSIS — Z79899 Other long term (current) drug therapy: Secondary | ICD-10-CM | POA: Insufficient documentation

## 2012-12-09 DIAGNOSIS — J020 Streptococcal pharyngitis: Secondary | ICD-10-CM | POA: Insufficient documentation

## 2012-12-09 DIAGNOSIS — H9209 Otalgia, unspecified ear: Secondary | ICD-10-CM | POA: Insufficient documentation

## 2012-12-09 DIAGNOSIS — IMO0001 Reserved for inherently not codable concepts without codable children: Secondary | ICD-10-CM | POA: Insufficient documentation

## 2012-12-09 MED ORDER — AZITHROMYCIN 250 MG PO TABS: 250.0000 mg | ORAL_TABLET | Freq: Every day | ORAL | Status: DC

## 2012-12-09 MED ORDER — IBUPROFEN 800 MG PO TABS: 800.0000 mg | ORAL_TABLET | Freq: Three times a day (TID) | ORAL | Status: DC

## 2012-12-09 MED ORDER — AZITHROMYCIN 250 MG PO TABS
500.0000 mg | ORAL_TABLET | Freq: Once | ORAL | Status: AC
  Administered 2012-12-09: 500 mg via ORAL
  Filled 2012-12-09: qty 2

## 2012-12-09 MED ORDER — IBUPROFEN 400 MG PO TABS
800.0000 mg | ORAL_TABLET | Freq: Once | ORAL | Status: AC
  Administered 2012-12-09: 800 mg via ORAL
  Filled 2012-12-09: qty 2

## 2012-12-09 MED ORDER — DEXAMETHASONE SODIUM PHOSPHATE 10 MG/ML IJ SOLN
10.0000 mg | Freq: Once | INTRAMUSCULAR | Status: AC
  Administered 2012-12-09: 10 mg via INTRAMUSCULAR
  Filled 2012-12-09: qty 1

## 2012-12-09 NOTE — ED Provider Notes (Signed)
Medical screening examination/treatment/procedure(s) were performed by non-physician practitioner and as supervising physician I was immediately available for consultation/collaboration.   Flint Melter, MD 12/09/12 [phone]

## 2012-12-09 NOTE — ED Provider Notes (Signed)
History   This chart was scribed for non-physician practitioner working with Flint Melter, MD by Gerlean Ren, ED Scribe. This patient was seen in room TR07C/TR07C and the patient's care was started at 6:23 PM.    CSN: [id number]  Arrival date & time 12/09/12  1727   First MD Initiated Contact with Patient 12/09/12 1746      Chief Complaint  Patient presents with  . Sore Throat  . Otalgia    The history is provided by the patient. No language interpreter was used.   Crystal Myers is a 32 y.o. female with h/o anemia who presents to the Emergency Department complaining of constant sore throat equal on both sides with onset yesterday evening with associated myalgias.  Pt denies known sick contacts.  Pt has used analgesic throat lozenges with mild temporary relief.  Pt is a current everyday smoker and reports occasional alcohol use.   Past Medical History  Diagnosis Date  . Anemia   . Chlamydia     Past Surgical History  Procedure Date  . Cesarean section   . Appendectomy   . Tonsillectomy   . Cesarean section     x4  . Tubal ligation     History reviewed. No pertinent family history.  History  Substance Use Topics  . Smoking status: Current Some Day Smoker -- 0.5 packs/day    Types: Cigarettes  . Smokeless tobacco: Never Used  . Alcohol Use: Yes     Comment: occasional    OB History    Grav Para Term Preterm Abortions TAB SAB Ect Mult Living   5 5 3 2      3       Review of Systems  Constitutional: Negative for fever.  HENT: Positive for sore throat.   Musculoskeletal: Positive for myalgias.    Allergies  Augmentin and Percocet  Home Medications   Current Outpatient Rx  Name  Route  Sig  Dispense  Refill  . BUTALBITAL-APAP-CAFFEINE 50-325-40 MG PO TABS   Oral   Take 1 tablet by mouth daily as needed. For migraines          . FAMOTIDINE 20 MG PO TABS   Oral   Take 1 tablet (20 mg total) by mouth 2 (two) times daily.   30 tablet   0   .  HYDROCODONE-ACETAMINOPHEN 5-325 MG PO TABS      Take 1-2 tablets every 6 hours as needed for severe pain   15 tablet   0   . IBUPROFEN 600 MG PO TABS   Oral   Take 1 tablet (600 mg total) by mouth every 6 (six) hours as needed for pain.   30 tablet   0   . KETOROLAC TROMETHAMINE 10 MG PO TABS   Oral   Take 1 tablet (10 mg total) by mouth every 6 (six) hours as needed for pain. Do not take with ibuprofen.   20 tablet   0   . LEVONORGEST-ETH ESTRAD 91-DAY 0.15-0.03 &0.01 MG PO TABS   Oral   Take 1 tablet by mouth daily.           Marland Kitchen OMEPRAZOLE 20 MG PO CPDR   Oral   Take 2 capsules (40 mg total) by mouth daily.   40 capsule   0   . ONDANSETRON 8 MG PO TBDP   Oral   Take 1 tablet (8 mg total) by mouth every 8 (eight) hours as needed for nausea.   6  tablet   0   . TOPIRAMATE 25 MG PO TABS   Oral   Take 25 mg by mouth daily.          . TRAZODONE HCL 50 MG PO TABS   Oral   Take 50 mg by mouth at bedtime.           BP 139/91  Pulse 123  Temp 99.6 F (37.6 C) (Oral)  Resp 18  SpO2 98%  LMP 10/19/2012  Physical Exam  Nursing note and vitals reviewed. Constitutional: She is oriented to person, place, and time. She appears well-developed and well-nourished. No distress.  HENT:  Head: Normocephalic and atraumatic.       Oropharynx edemadous and slightly red no exudates, nasal mucosa swollen, no redness  Eyes: EOM are normal.  Neck: Neck supple. No tracheal deviation present.       Mild cervical adenopathy.   Cardiovascular: Regular rhythm and normal heart sounds.   No murmur heard.      Tachycardic  Pulmonary/Chest: Effort normal and breath sounds normal. No respiratory distress. She has no wheezes.  Musculoskeletal: Normal range of motion.  Neurological: She is alert and oriented to person, place, and time.  Skin: Skin is warm and dry.  Psychiatric: She has a normal mood and affect. Her behavior is normal.    ED Course  Procedures (including critical  care time) DIAGNOSTIC STUDIES: Oxygen Saturation is 98% on room air, normal by my interpretation.    COORDINATION OF CARE: 6:28 PM- Patient informed of clinical course, understands medical decision-making process, and agrees with plan. 6:40 PM- Informed pt of positive strep screen.   Results for orders placed during the hospital encounter of 12/09/12  RAPID STREP SCREEN      Component Value Range   Streptococcus, Group A Screen (Direct) POSITIVE (*) NEGATIVE    No diagnosis found.  1. Strep pharyngitis   MDM  Uncomplicated strep infection - abx treatment with symptomatic support. I personally performed the services described in this documentation, which was scribed in my presence. The recorded information has been reviewed and is accurate.        Arnoldo Hooker, PA-C 12/09/12 1847

## 2012-12-09 NOTE — ED Notes (Signed)
Pt c/o sore throat and earache x 2 days with aches

## 2012-12-09 NOTE — ED Notes (Signed)
Pt given discharge paperwork; pt verbalized understanding of discharge; no additional questions by pt; e-signature obtained; 

## 2013-02-01 ENCOUNTER — Encounter (HOSPITAL_COMMUNITY): Payer: Self-pay | Admitting: *Deleted

## 2013-02-01 ENCOUNTER — Emergency Department (HOSPITAL_COMMUNITY)
Admission: EM | Admit: 2013-02-01 | Discharge: 2013-02-01 | Disposition: A | Discharge status: Home / Self Care | Payer: Medicaid Other | Attending: Emergency Medicine | Admitting: Emergency Medicine

## 2013-02-01 ENCOUNTER — Emergency Department (HOSPITAL_COMMUNITY): Payer: Medicaid Other

## 2013-02-01 DIAGNOSIS — F17200 Nicotine dependence, unspecified, uncomplicated: Secondary | ICD-10-CM | POA: Insufficient documentation

## 2013-02-01 DIAGNOSIS — Z79899 Other long term (current) drug therapy: Secondary | ICD-10-CM | POA: Insufficient documentation

## 2013-02-01 DIAGNOSIS — Z862 Personal history of diseases of the blood and blood-forming organs and certain disorders involving the immune mechanism: Secondary | ICD-10-CM | POA: Insufficient documentation

## 2013-02-01 DIAGNOSIS — M722 Plantar fascial fibromatosis: Secondary | ICD-10-CM | POA: Insufficient documentation

## 2013-02-01 DIAGNOSIS — Z8619 Personal history of other infectious and parasitic diseases: Secondary | ICD-10-CM | POA: Insufficient documentation

## 2013-02-01 MED ORDER — HYDROCODONE-ACETAMINOPHEN 5-325 MG PO TABS: 1.0000 | ORAL_TABLET | ORAL | Status: DC | PRN

## 2013-02-01 MED ORDER — NAPROXEN 500 MG PO TABS: 500.0000 mg | ORAL_TABLET | Freq: Two times a day (BID) | ORAL | Status: DC

## 2013-02-01 NOTE — ED Provider Notes (Signed)
History  This chart was scribed for Dione Booze, MD by Shari Heritage, ED Scribe. The patient was seen in room TR10C/TR10C. Patient's care was started at 1952.   CSN: [id number]  Arrival date & time 02/01/13  1732   First MD Initiated Contact with Patient 02/01/13 1952      Chief Complaint  Patient presents with  . Foot Pain     The history is provided by the patient. No language interpreter was used.    HPI Comments: Crystal Myers is a 32 y.o. female who presents to the Emergency Department complaining of moderate to severe, constant, non-radiating right heel pain onset 2 days ago. She rates pain as 9/10. Pain is worse with bearing weight. Patient denies any obvious injury or trauma to the foot. She states that she has been taking ibuprofen in 800 mg doses without relief. There is no numbness, weakness or tingling of the extremities. No fever or vomiting.    Past Medical History  Diagnosis Date  . Anemia   . Chlamydia     Past Surgical History  Procedure Laterality Date  . Cesarean section    . Appendectomy    . Tonsillectomy    . Cesarean section      x4  . Tubal ligation      No family history on file.  History  Substance Use Topics  . Smoking status: Current Some Day Smoker -- 0.50 packs/day    Types: Cigarettes  . Smokeless tobacco: Never Used  . Alcohol Use: Yes     Comment: occasional    OB History   Grav Para Term Preterm Abortions TAB SAB Ect Mult Living   5 5 3 2      3       Review of Systems  Constitutional: Negative for fever.  Gastrointestinal: Negative for vomiting.  Musculoskeletal: Positive for myalgias.  Neurological: Negative for weakness and numbness.    Allergies  Augmentin and Percocet  Home Medications   Current Outpatient Rx  Name  Route  Sig  Dispense  Refill  . azithromycin (ZITHROMAX Z-PAK) 250 MG tablet   Oral   Take 1 tablet (250 mg total) by mouth daily.   4 tablet   0   . butalbital-acetaminophen-caffeine  (FIORICET, ESGIC) 50-325-40 MG per tablet   Oral   Take 1 tablet by mouth daily as needed. For migraines          . famotidine (PEPCID) 20 MG tablet   Oral   Take 1 tablet (20 mg total) by mouth 2 (two) times daily.   30 tablet   0   . HYDROcodone-acetaminophen (NORCO/VICODIN) 5-325 MG per tablet      Take 1-2 tablets every 6 hours as needed for severe pain   15 tablet   0   . ibuprofen (ADVIL,MOTRIN) 600 MG tablet   Oral   Take 1 tablet (600 mg total) by mouth every 6 (six) hours as needed for pain.   30 tablet   0   . ibuprofen (ADVIL,MOTRIN) 800 MG tablet   Oral   Take 1 tablet (800 mg total) by mouth 3 (three) times daily.   21 tablet   0   . ketorolac (TORADOL) 10 MG tablet   Oral   Take 1 tablet (10 mg total) by mouth every 6 (six) hours as needed for pain. Do not take with ibuprofen.   20 tablet   0   . Levonorgestrel-Ethinyl Estradiol (SEASONIQUE) 0.15-0.03 &0.01 MG tablet  Oral   Take 1 tablet by mouth daily.           Marland Kitchen omeprazole (PRILOSEC) 20 MG capsule   Oral   Take 2 capsules (40 mg total) by mouth daily.   40 capsule   0   . ondansetron (ZOFRAN ODT) 8 MG disintegrating tablet   Oral   Take 1 tablet (8 mg total) by mouth every 8 (eight) hours as needed for nausea.   6 tablet   0   . topiramate (TOPAMAX) 25 MG tablet   Oral   Take 25 mg by mouth daily.          . traZODone (DESYREL) 50 MG tablet   Oral   Take 50 mg by mouth at bedtime.           Triage Vitals: BP 141/89  Pulse 97  Temp(Src) 98 F (36.7 C) (Oral)  Resp 18  SpO2 99%  LMP 01/31/2013  Physical Exam  Constitutional: She is oriented to person, place, and time. She appears well-developed and well-nourished.  HENT:  Head: Normocephalic and atraumatic.  Neck: Normal range of motion. Neck supple.  Musculoskeletal:       Right foot: She exhibits tenderness.  Right heel has moderate tenderness diffusely. Pain elicited with stress of plantar fascia.   Neurological:  She is alert and oriented to person, place, and time.  Skin: Skin is warm and dry.  Psychiatric: She has a normal mood and affect. Her behavior is normal.    ED Course  Procedures (including critical care time) DIAGNOSTIC STUDIES: Oxygen Saturation is 99% on room air, normal by my interpretation.    COORDINATION OF CARE: 7:56 PM- Patient informed of current plan for treatment and evaluation and agrees with plan at this time.    Dg Foot Complete Right  02/01/2013  *RADIOLOGY REPORT*  Clinical Data:  Foot and heel pain  RIGHT FOOT COMPLETE - 3+ VIEW  Comparison: None  Findings: Joint spaces preserved. Osseous mineralization normal. No acute fracture, dislocation or bone destruction. Tiny plantar calcaneal spur.  IMPRESSION: No acute osseous abnormalities. Tiny plantar calcaneal spur.   Original Report Authenticated By: Ulyses Southward, M.D.      1. Plantar fasciitis of right foot       MDM  The pain is consistent with plantar fasciitis. Tiny plantar calcaneal spur is also consistent with this but probably not related to her acute pain. She will be given crutches and prescriptions given for naproxen and Norco and rash she is referred to on-call orthopedics.      I personally performed the services described in this documentation, which was scribed in my presence. The recorded information has been reviewed and is accurate.    Dione Booze, MD 02/01/13 2006

## 2013-02-01 NOTE — ED Notes (Signed)
Pt presents with right foot pain that began Friday and has gotten worse since, states pain is mainly in her heel.  Beginning to have difficulty bearing weight on foot.  No obvious deformity noted to foot, pt denies any injuries.

## 2013-02-01 NOTE — Progress Notes (Signed)
Orthopedic Tech Progress Note Patient Details:  Crystal Myers 1981/04/01 [id number]  Ortho Devices Type of Ortho Device: Postop shoe/boot Ortho Device/Splint Location: right foot Ortho Device/Splint Interventions: Application   Crawford, Rembert 02/01/2013, 8:59 PM

## 2013-02-01 NOTE — Progress Notes (Signed)
Orthopedic Tech Progress Note Patient Details:  Crystal Myers 02/10/1981 [id number]  Ortho Devices Type of Ortho Device: Crutches Ortho Device/Splint Interventions: Application   Nikki Dom 02/01/2013, 8:19 PM

## 2013-02-01 NOTE — ED Notes (Signed)
Pt is here with right heel pain that hurts to bear weight on heel.  No injury

## 2013-02-11 ENCOUNTER — Encounter (HOSPITAL_COMMUNITY): Payer: Self-pay

## 2013-02-11 ENCOUNTER — Emergency Department (HOSPITAL_COMMUNITY): Payer: Medicaid Other

## 2013-02-11 ENCOUNTER — Emergency Department (HOSPITAL_COMMUNITY)
Admission: EM | Admit: 2013-02-11 | Discharge: 2013-02-11 | Disposition: A | Discharge status: Home / Self Care | Payer: Medicaid Other | Attending: Emergency Medicine | Admitting: Emergency Medicine

## 2013-02-11 DIAGNOSIS — F17200 Nicotine dependence, unspecified, uncomplicated: Secondary | ICD-10-CM | POA: Insufficient documentation

## 2013-02-11 DIAGNOSIS — Z79899 Other long term (current) drug therapy: Secondary | ICD-10-CM | POA: Insufficient documentation

## 2013-02-11 DIAGNOSIS — R110 Nausea: Secondary | ICD-10-CM | POA: Insufficient documentation

## 2013-02-11 DIAGNOSIS — G43109 Migraine with aura, not intractable, without status migrainosus: Secondary | ICD-10-CM | POA: Insufficient documentation

## 2013-02-11 DIAGNOSIS — R5381 Other malaise: Secondary | ICD-10-CM | POA: Insufficient documentation

## 2013-02-11 DIAGNOSIS — Z8619 Personal history of other infectious and parasitic diseases: Secondary | ICD-10-CM | POA: Insufficient documentation

## 2013-02-11 DIAGNOSIS — R209 Unspecified disturbances of skin sensation: Secondary | ICD-10-CM | POA: Insufficient documentation

## 2013-02-11 DIAGNOSIS — Z862 Personal history of diseases of the blood and blood-forming organs and certain disorders involving the immune mechanism: Secondary | ICD-10-CM | POA: Insufficient documentation

## 2013-02-11 DIAGNOSIS — G43909 Migraine, unspecified, not intractable, without status migrainosus: Secondary | ICD-10-CM

## 2013-02-11 LAB — POCT I-STAT, CHEM 8
Calcium, Ion: 1.15 mmol/L (ref 1.12–1.23)
Chloride: 108 mEq/L (ref 96–112)
Glucose, Bld: 89 mg/dL (ref 70–99)
HCT: 37 % (ref 36.0–46.0)
Hemoglobin: 12.6 g/dL (ref 12.0–15.0)
TCO2: 25 mmol/L (ref 0–100)

## 2013-02-11 LAB — CBC WITH DIFFERENTIAL/PLATELET
Basophils Absolute: 0 10*3/uL (ref 0.0–0.1)
Basophils Relative: 0 % (ref 0–1)
Eosinophils Absolute: 0.3 10*3/uL (ref 0.0–0.7)
HCT: 34.6 % — ABNORMAL LOW (ref 36.0–46.0)
Hemoglobin: 11.3 g/dL — ABNORMAL LOW (ref 12.0–15.0)
MCH: 19.8 pg — ABNORMAL LOW (ref 26.0–34.0)
MCHC: 32.7 g/dL (ref 30.0–36.0)
Monocytes Absolute: 0.3 10*3/uL (ref 0.1–1.0)
Neutro Abs: 4.7 10*3/uL (ref 1.7–7.7)
Neutrophils Relative %: 65 % (ref 43–77)
RDW: 15.8 % — ABNORMAL HIGH (ref 11.5–15.5)

## 2013-02-11 LAB — POCT I-STAT TROPONIN I: Troponin i, poc: 0.02 ng/mL (ref 0.00–0.08)

## 2013-02-11 LAB — GLUCOSE, CAPILLARY: Glucose-Capillary: 111 mg/dL — ABNORMAL HIGH (ref 70–99)

## 2013-02-11 LAB — COMPREHENSIVE METABOLIC PANEL
BUN: 8 mg/dL (ref 6–23)
Calcium: 9.1 mg/dL (ref 8.4–10.5)
GFR calc Af Amer: >90 mL/min (ref >90)
Glucose, Bld: 104 mg/dL — ABNORMAL HIGH (ref 70–99)
Total Protein: 7.4 g/dL (ref 6.0–8.3)

## 2013-02-11 MED ORDER — PROMETHAZINE HCL 25 MG/ML IJ SOLN
25.0000 mg | Freq: Once | INTRAMUSCULAR | Status: AC
  Administered 2013-02-11: 25 mg via INTRAVENOUS
  Filled 2013-02-11 (×2): qty 1

## 2013-02-11 MED ORDER — SODIUM CHLORIDE 0.9 % IV BOLUS (SEPSIS)
500.0000 mL | Freq: Once | INTRAVENOUS | Status: AC
  Administered 2013-02-11: 500 mL via INTRAVENOUS

## 2013-02-11 MED ORDER — KETOROLAC TROMETHAMINE 30 MG/ML IJ SOLN
30.0000 mg | Freq: Once | INTRAMUSCULAR | Status: AC
  Administered 2013-02-11: 30 mg via INTRAVENOUS
  Filled 2013-02-11: qty 1

## 2013-02-11 NOTE — ED Notes (Signed)
Discussed follow up care with patient

## 2013-02-11 NOTE — ED Notes (Signed)
Pt st's at approx 2:30 today she had pain in top of head with numbness in left side of face and now left arm numbness.  Left hand grip weaker than right.  Left arm drift.

## 2013-02-11 NOTE — ED Notes (Signed)
Pt. Brought back from Triage . Triage RN called a code stroke .  Dr. Rubin Payor at the bedside.   Neurologist arrived. Pt.transferred directly to CT scan and then directly to MRI.

## 2013-02-11 NOTE — Progress Notes (Signed)
Reason for Consult: Left sided numbess Referring Physician: Benjiman Core  CC: left sided numbness  History is obtained from:Patient  HPI: Crystal Myers is a 32 y.o. female who began having a frontal headache at approximately 2:30pm. She then noticed about 20 minutes later, numbness/tingling start in her head and work it's way down her face to her arm. She states that the tingling is mostly in her arm. She also states that it feels heavy. She has a history of migraine, but has not had numbness before.   LKW: 2:30 pm tpa given: no, not a stroke.   ROS: A 14 point ROS was performed and is negative except as noted in the HPI.  Past Medical History  Diagnosis Date  . Anemia   . Chlamydia    SHx: + smoker  Exam: Current vital signs: BP 146/87  Pulse 112  Temp(Src) 98.2 F (36.8 C) (Oral)  Resp 17  SpO2 100%  LMP 01/31/2013 Vital signs in last 24 hours: Temp:  [98.2 F (36.8 C)] 98.2 F (36.8 C) (03/20 1642) Pulse Rate:  [112] 112 (03/20 1642) Resp:  [17] 17 (03/20 1642) BP: (146)/(87) 146/87 mmHg (03/20 1642) SpO2:  [100 %] 100 % (03/20 1642)  General: in bed,  CV: RRR Mental Status: Patient is awake, alert, oriented to person, place, month, year, and situation.  Cranial Nerves: II: Visual Fields are full. Pupils are equal, round, and reactive to light.  Discs are difficult to visualize. III,IV, VI: EOMI without ptosis or diploplia.  V: Facial sensation is symmetric to temperature VII: Facial movement is symmetric.  VIII: hearing is intact to voice X: Uvula elevates symmetrically XI: Shoulder shrug is symmetric. XII: tongue is midline without atrophy or fasciculations.  Motor: Tone is normal. Bulk is normal. 5/5 strength was present in right side, has difficulty kepping left out of bed, 4/5 strength throughout left side, though some inconsistency is noted.  Sensory: Decreased to pin in left face, arm and leg.  Deep Tendon Reflexes: 2+ and symmetric in the  biceps and patellae.  Plantars: Toes are downgoing bilaterally.  Cerebellar: FNF and HKS are intact on right, some difficulty on left consistent with her weakness.  Gait: Did not assess    I have reviewed labs in epic and the results pertinent to this consultation are: BMP unremarkable, mild anemia  I have reviewed the images obtained:MRI brain - no acute infarct.   Impression: 32 yo F with spreading numbness in there setting of headache consistent with complicated migraine. I would recommend treating this as migraine at this time given her negative MRI  Recommendations: 1) Agree with toradol/phenergan.  2) If no improvement, could give magnesium 2gm + depakote 1000mg  IV.    Ritta Slot, MD [facility] [phone]  If 7pm- 7am, please page neurology on call at [phone].

## 2013-02-11 NOTE — ED Provider Notes (Signed)
History     CSN: [id number]  Arrival date & time 02/11/13  1556   First MD Initiated Contact with Patient 02/11/13 1706      Chief Complaint  Patient presents with  . Code Stroke    (Consider location/radiation/quality/duration/timing/severity/associated sxs/prior treatment) The history is provided by the patient.   patient came in as a code stroke. At around 2:30 today she developed a headache on the top of her head. She's a history of migraines she states this does not feel like that. She has no vision changes. She states that she then developed numbness and some weakness on the left side of her body. The light does not bother her. No confusion. No difficulty seen. No difficulty speaking. She denies possibility of pregnancy.  Past Medical History  Diagnosis Date  . Anemia   . Chlamydia     Past Surgical History  Procedure Laterality Date  . Cesarean section    . Appendectomy    . Tonsillectomy    . Cesarean section      x4  . Tubal ligation      No family history on file.  History  Substance Use Topics  . Smoking status: Current Some Day Smoker -- 0.50 packs/day    Types: Cigarettes  . Smokeless tobacco: Never Used  . Alcohol Use: Yes     Comment: occasional    OB History   Grav Para Term Preterm Abortions TAB SAB Ect Mult Living   5 5 3 2      3       Review of Systems  Constitutional: Negative for activity change and appetite change.  HENT: Negative for neck stiffness.   Eyes: Negative for pain.  Respiratory: Negative for chest tightness and shortness of breath.   Cardiovascular: Negative for chest pain and leg swelling.  Gastrointestinal: Positive for nausea. Negative for vomiting, abdominal pain and diarrhea.  Genitourinary: Negative for flank pain.  Musculoskeletal: Negative for back pain.  Skin: Negative for rash.  Neurological: Positive for weakness, numbness and headaches.  Psychiatric/Behavioral: Negative for behavioral problems.    Allergies   Augmentin and Percocet  Home Medications   Current Outpatient Rx  Name  Route  Sig  Dispense  Refill  . butalbital-acetaminophen-caffeine (FIORICET, ESGIC) 50-325-40 MG per tablet   Oral   Take 1 tablet by mouth daily as needed. For migraines          . famotidine (PEPCID) 20 MG tablet   Oral   Take 1 tablet (20 mg total) by mouth 2 (two) times daily.   30 tablet   0   . HYDROcodone-acetaminophen (NORCO/VICODIN) 5-325 MG per tablet   Oral   Take 1 tablet by mouth every 4 (four) hours as needed for pain.   20 tablet   0   . ibuprofen (ADVIL,MOTRIN) 600 MG tablet   Oral   Take 1 tablet (600 mg total) by mouth every 6 (six) hours as needed for pain.   30 tablet   0   . Levonorgestrel-Ethinyl Estradiol (SEASONIQUE) 0.15-0.03 &0.01 MG tablet   Oral   Take 1 tablet by mouth daily.           . naproxen (NAPROSYN) 500 MG tablet   Oral   Take 1 tablet (500 mg total) by mouth 2 (two) times daily.   30 tablet   0   . omeprazole (PRILOSEC) 20 MG capsule   Oral   Take 2 capsules (40 mg total) by mouth daily.  40 capsule   0   . ondansetron (ZOFRAN ODT) 8 MG disintegrating tablet   Oral   Take 1 tablet (8 mg total) by mouth every 8 (eight) hours as needed for nausea.   6 tablet   0   . topiramate (TOPAMAX) 25 MG tablet   Oral   Take 25 mg by mouth daily.          . traZODone (DESYREL) 50 MG tablet   Oral   Take 50 mg by mouth at bedtime.           BP 120/76  Pulse 76  Temp(Src) 98.4 F (36.9 C) (Oral)  Resp 17  SpO2 94%  LMP 01/31/2013  Physical Exam  Nursing note and vitals reviewed. Constitutional: She is oriented to person, place, and time. She appears well-developed and well-nourished.  HENT:  Head: Normocephalic and atraumatic.  Eyes: EOM are normal. Pupils are equal, round, and reactive to light.  Neck: Normal range of motion. Neck supple.  Cardiovascular: Normal rate, regular rhythm and normal heart sounds.   No murmur  heard. Pulmonary/Chest: Effort normal and breath sounds normal. No respiratory distress. She has no wheezes. She has no rales.  Abdominal: Soft. Bowel sounds are normal. She exhibits no distension. There is no tenderness. There is no rebound and no guarding.  Musculoskeletal: Normal range of motion.  Neurological: She is alert and oriented to person, place, and time. A cranial nerve deficit is present.  Decreased sensation to entire left-sided body. Worse on left upper extremity per patient. Face is symmetric. Mildly decreased strength on left side compared to right. Patient is awake and appropriate. Complete NIH score done by neurology.  Skin: Skin is warm and dry.  Psychiatric: She has a normal mood and affect. Her speech is normal.    ED Course  Procedures (including critical care time)  Labs Reviewed  CBC WITH DIFFERENTIAL - Abnormal; Notable for the following:    RBC 5.72 (*)    Hemoglobin 11.3 (*)    HCT 34.6 (*)    MCV 60.5 (*)    MCH 19.8 (*)    RDW 15.8 (*)    All other components within normal limits  COMPREHENSIVE METABOLIC PANEL - Abnormal; Notable for the following:    Potassium 3.3 (*)    Glucose, Bld 104 (*)    All other components within normal limits  GLUCOSE, CAPILLARY - Abnormal; Notable for the following:    Glucose-Capillary 111 (*)    All other components within normal limits  POCT I-STAT TROPONIN I  POCT I-STAT, CHEM 8   Ct Head Wo Contrast  02/11/2013  *RADIOLOGY REPORT*  Clinical Data: Code stroke.  Dr. Amada Jupiter was present for the examination.  CT HEAD WITHOUT CONTRAST  Technique:  Contiguous axial images were obtained from the base of the skull through the vertex without contrast.  Comparison: None.  Findings: No acute intracranial abnormalities identified. Specifically, there is no hemorrhage, hydrocephalus, mass effect, mass lesion, or evidence of acute cortically based infarction.  The skull is intact.  There is mild right frontal ethmoid sinus  disease.  The mastoid air cells are clear.  Soft tissues of the orbits and scalp are intact.  IMPRESSION:  1.  No acute intracranial abnormality. 2.  Mild right ethmoid sinus disease.   Original Report Authenticated By: Britta Mccreedy, M.D.    Mr Brain Wo Contrast  02/11/2013  *RADIOLOGY REPORT*  Clinical Data: Left upper extremity weakness.  Code stroke.  MRI HEAD  WITHOUT CONTRAST  Technique:  Multiplanar, multiecho pulse sequences of the brain and surrounding structures were obtained according to standard protocol without intravenous contrast.  Comparison: None.  Findings: No acute infarct, hemorrhage, or mass lesion is present. The ventricles are of normal size.  No significant extra-axial fluid collection is present.  There is no significant white matter disease.  Flow is present in the major intracranial arteries.  The globes and orbits are intact.  Minimal mucosal thickening is present in the maxillary and ethmoid air cells bilaterally. Minimal mucosal thickening is present in the left frontal sinus. The minimal fluid is present in the mastoid air cells bilaterally. No obstructing nasopharyngeal lesion is evident.  IMPRESSION:  1.  Normal MRI of the brain. 2.  Minimal mucosal thickening in the paranasal sinuses. 3.  Minimal fluid in the mastoid air cells. 4.  No obstructing nasopharyngeal lesion is evident.   Original Report Authenticated By: Marin Roberts, M.D.      1. Migraine   2. Complicated migraine       MDM  Patient presents wi his words are Marquita Palms isth headache and left-sided weakness. Code stroke have been called. CT was normal MRI was done and did not show abnormality. At this point is likely complicated migraine. She was treated the headache has improved, as have the neurologic symptoms. She will be discharged home to followup with her primary care Dr        Juliet Rude. Rubin Payor, MD 02/11/13 2209

## 2013-02-11 NOTE — ED Notes (Signed)
Code stroke has been cancelled by Dr. Petra Kuba.   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

## 2013-02-11 NOTE — ED Notes (Signed)
Cancelled Code Stroke per Dr. Amada Jupiter

## 2013-08-06 ENCOUNTER — Emergency Department (HOSPITAL_COMMUNITY)
Admission: EM | Admit: 2013-08-06 | Discharge: 2013-08-06 | Disposition: A | Discharge status: Home / Self Care | Payer: Medicaid Other | Attending: Emergency Medicine | Admitting: Emergency Medicine

## 2013-08-06 ENCOUNTER — Encounter (HOSPITAL_COMMUNITY): Payer: Self-pay | Admitting: *Deleted

## 2013-08-06 DIAGNOSIS — R110 Nausea: Secondary | ICD-10-CM | POA: Insufficient documentation

## 2013-08-06 DIAGNOSIS — F17200 Nicotine dependence, unspecified, uncomplicated: Secondary | ICD-10-CM | POA: Insufficient documentation

## 2013-08-06 DIAGNOSIS — Z79899 Other long term (current) drug therapy: Secondary | ICD-10-CM | POA: Insufficient documentation

## 2013-08-06 DIAGNOSIS — Z8619 Personal history of other infectious and parasitic diseases: Secondary | ICD-10-CM | POA: Insufficient documentation

## 2013-08-06 DIAGNOSIS — H9209 Otalgia, unspecified ear: Secondary | ICD-10-CM | POA: Insufficient documentation

## 2013-08-06 DIAGNOSIS — H9202 Otalgia, left ear: Secondary | ICD-10-CM

## 2013-08-06 DIAGNOSIS — Z862 Personal history of diseases of the blood and blood-forming organs and certain disorders involving the immune mechanism: Secondary | ICD-10-CM | POA: Insufficient documentation

## 2013-08-06 MED ORDER — CIPROFLOXACIN-DEXAMETHASONE 0.3-0.1 % OT SUSP: 4.0000 [drp] | Freq: Two times a day (BID) | OTIC | Status: AC

## 2013-08-06 NOTE — ED Notes (Signed)
Pt reports left ear pain x 2 days and nausea beginning today. Able to eat breakfast, no vomiting. No fever/chills. No congestion/sore throat.

## 2013-08-06 NOTE — ED Notes (Signed)
MD at bedside. 

## 2013-08-06 NOTE — ED Provider Notes (Signed)
CSN: [id number]     Arrival date & time 08/06/13  4098 History   First MD Initiated Contact with Patient 08/06/13 [phone]     Chief Complaint  Patient presents with  . Otalgia  . Nausea   HPI Comments: Pt is a 32 y/o female with PMHx of Complicated migraine who presents today with L otalgia.  Pt states pain began about two days ago in her left ear and progressed to have some sharp pains shooting across her cheek/lip and eventually into her mastoid region and upper left cervical region.  Describes pain in her ear as constant, sharp, and does not change with head movement or body movement.  She denies fever, chills, sweats, tinnitus, hearing loss, recent pool/hot tub exposure, recent travel, recent nasal congestion, productive cough, extremity weakness/numbness/tingling, generalized weakness, nausea, vomiting, diarrhea, lower extremity edema, HA, lightheaded, dizziness.    Patient is a 32 y.o. female presenting with ear pain. The history is provided by the patient.  Otalgia Associated symptoms: no abdominal pain, no congestion, no ear discharge, no fever, no headaches, no hearing loss, no tinnitus and no vomiting     Past Medical History  Diagnosis Date  . Anemia   . Chlamydia    Past Surgical History  Procedure Laterality Date  . Cesarean section    . Appendectomy    . Tonsillectomy    . Cesarean section      x4  . Tubal ligation     No family history on file. History  Substance Use Topics  . Smoking status: Current Some Day Smoker -- 0.50 packs/day    Types: Cigarettes  . Smokeless tobacco: Never Used  . Alcohol Use: Yes     Comment: occasional   OB History   Grav Para Term Preterm Abortions TAB SAB Ect Mult Living   5 5 3 2      3      Review of Systems  Constitutional: Negative for fever, chills, fatigue and unexpected weight change.  HENT: Positive for ear pain. Negative for hearing loss, congestion, facial swelling, trouble swallowing, neck stiffness, dental problem,  tinnitus and ear discharge.   Eyes: Negative for photophobia, pain and visual disturbance.  Respiratory: Negative for chest tightness and shortness of breath.   Cardiovascular: Negative for chest pain, palpitations and leg swelling.  Gastrointestinal: Negative for nausea, vomiting and abdominal pain.  Endocrine: Negative.   Genitourinary: Negative.   Musculoskeletal: Negative.   Skin: Negative.   Neurological: Negative for dizziness, syncope, facial asymmetry, speech difficulty, weakness, light-headedness, numbness and headaches.  Psychiatric/Behavioral: Negative.     Allergies  Augmentin and Percocet  Home Medications   Current Outpatient Rx  Name  Route  Sig  Dispense  Refill  . butalbital-acetaminophen-caffeine (FIORICET, ESGIC) 50-325-40 MG per tablet   Oral   Take 1 tablet by mouth daily as needed. For migraines          . famotidine (PEPCID) 20 MG tablet   Oral   Take 1 tablet (20 mg total) by mouth 2 (two) times daily.   30 tablet   0   . HYDROcodone-acetaminophen (NORCO/VICODIN) 5-325 MG per tablet   Oral   Take 1 tablet by mouth every 4 (four) hours as needed for pain.   20 tablet   0   . ibuprofen (ADVIL,MOTRIN) 600 MG tablet   Oral   Take 1 tablet (600 mg total) by mouth every 6 (six) hours as needed for pain.   30 tablet   0   .  Levonorgestrel-Ethinyl Estradiol (SEASONIQUE) 0.15-0.03 &0.01 MG tablet   Oral   Take 1 tablet by mouth daily.           . naproxen (NAPROSYN) 500 MG tablet   Oral   Take 1 tablet (500 mg total) by mouth 2 (two) times daily.   30 tablet   0   . omeprazole (PRILOSEC) 20 MG capsule   Oral   Take 2 capsules (40 mg total) by mouth daily.   40 capsule   0   . ondansetron (ZOFRAN ODT) 8 MG disintegrating tablet   Oral   Take 1 tablet (8 mg total) by mouth every 8 (eight) hours as needed for nausea.   6 tablet   0   . topiramate (TOPAMAX) 25 MG tablet   Oral   Take 25 mg by mouth daily.          . traZODone  (DESYREL) 50 MG tablet   Oral   Take 50 mg by mouth at bedtime.          There were no vitals taken for this visit. Physical Exam  Constitutional: She is oriented to person, place, and time. She appears well-developed and well-nourished. She is active.  Non-toxic appearance. She does not have a sickly appearance. She does not appear ill. No distress.  HENT:  Head: Normocephalic.  Right Ear: Hearing, tympanic membrane, external ear and ear canal normal.  Left Ear: Hearing and tympanic membrane normal. No lacerations. There is tenderness. No drainage or swelling. No foreign bodies. No mastoid tenderness. Tympanic membrane is not perforated, not erythematous, not retracted and not bulging. Tympanic membrane mobility is normal.  No middle ear effusion. No hemotympanum.  Nose: Nose normal. Right sinus exhibits no maxillary sinus tenderness and no frontal sinus tenderness. Left sinus exhibits no maxillary sinus tenderness and no frontal sinus tenderness.  Mouth/Throat: Uvula is midline, oropharynx is clear and moist and mucous membranes are normal.  L ear canal erythema   Eyes: Conjunctivae and EOM are normal. Pupils are equal, round, and reactive to light.  Neck: Normal range of motion and full passive range of motion without pain. No spinous process tenderness and no muscular tenderness present. No rigidity. No edema, no erythema and normal range of motion present.  Cardiovascular: Normal rate, regular rhythm, normal heart sounds, intact distal pulses and normal pulses.   No murmur heard. Pulmonary/Chest: Effort normal and breath sounds normal.  Abdominal: Normal appearance and bowel sounds are normal.  Lymphadenopathy:    She has no cervical adenopathy.  Neurological: She is alert and oriented to person, place, and time. She has normal strength and normal reflexes. No cranial nerve deficit or sensory deficit. She displays a negative Romberg sign.  Skin: Skin is warm, dry and intact. She is not  diaphoretic. No pallor.    ED Course  Procedures (including critical care time) Labs Review Labs Reviewed - No data to display Imaging Review No results found.  MDM   1. Otalgia of left ear   Pt story consistent with l ear irritation, no red flags seen for acute CVA.  Will tx for possible otitis externa and specific instructions on return to the ED.   Twana First Paulina Fusi, DO of [facility] 08/06/2013, 1:26 PM       Briscoe Deutscher, DO 08/06/13 1326

## 2013-08-06 NOTE — ED Provider Notes (Signed)
I saw and evaluated the patient, reviewed the resident's note and I agree with the findings and plan.  Few days of gradual onset constant left ear pain worse with traction to outer helix and palpation of the tragus without cellulitis noted mastoid area is nontender without erythema or excessive warmth or swelling no purulent drainage in the external auditory canal and tympanic membrane clear left ear, right tympanic membrane also clear, slight decreased light touch around the left ear region, no headache no fever no altered mental status no change in speech or vision no focal weakness or numbness to arms or legs or incoordination doubt stroke meningitis subarachnoid hemorrhage or other [facility] requiring further evaluation prior to discharge at this time.  Hurman Horn, MD 08/07/13 0030

## 2013-08-23 ENCOUNTER — Encounter (HOSPITAL_COMMUNITY): Payer: Self-pay | Admitting: *Deleted

## 2013-08-23 DIAGNOSIS — N949 Unspecified condition associated with female genital organs and menstrual cycle: Secondary | ICD-10-CM | POA: Insufficient documentation

## 2013-08-23 DIAGNOSIS — F17200 Nicotine dependence, unspecified, uncomplicated: Secondary | ICD-10-CM | POA: Insufficient documentation

## 2013-08-23 LAB — URINALYSIS, ROUTINE W REFLEX MICROSCOPIC
Bilirubin Urine: NEGATIVE
Ketones, ur: NEGATIVE mg/dL
Nitrite: NEGATIVE
pH: 6.5 (ref 5.0–8.0)

## 2013-08-23 LAB — URINE MICROSCOPIC-ADD ON

## 2013-08-23 NOTE — ED Notes (Signed)
Pt states that she has been having pelvic pain and uti like symptoms for the past couple of days. Pt states prone to UTI but this has pain that radiates to her back as well. Pt states foul odor to urine.

## 2013-08-24 ENCOUNTER — Emergency Department (HOSPITAL_COMMUNITY)
Admission: EM | Admit: 2013-08-24 | Discharge: 2013-08-24 | Discharge status: Against Medical Advice | Payer: Medicaid Other | Attending: Emergency Medicine | Admitting: Emergency Medicine

## 2013-08-25 ENCOUNTER — Emergency Department (HOSPITAL_COMMUNITY)
Admission: EM | Admit: 2013-08-25 | Discharge: 2013-08-26 | Disposition: A | Discharge status: Home / Self Care | Payer: Medicaid Other | Attending: Emergency Medicine | Admitting: Emergency Medicine

## 2013-08-25 ENCOUNTER — Emergency Department (HOSPITAL_COMMUNITY): Payer: Medicaid Other

## 2013-08-25 DIAGNOSIS — M65839 Other synovitis and tenosynovitis, unspecified forearm: Secondary | ICD-10-CM | POA: Insufficient documentation

## 2013-08-25 DIAGNOSIS — M659 Synovitis and tenosynovitis, unspecified: Secondary | ICD-10-CM

## 2013-08-25 DIAGNOSIS — Z8619 Personal history of other infectious and parasitic diseases: Secondary | ICD-10-CM | POA: Insufficient documentation

## 2013-08-25 DIAGNOSIS — F17200 Nicotine dependence, unspecified, uncomplicated: Secondary | ICD-10-CM | POA: Insufficient documentation

## 2013-08-25 DIAGNOSIS — Z791 Long term (current) use of non-steroidal anti-inflammatories (NSAID): Secondary | ICD-10-CM | POA: Insufficient documentation

## 2013-08-25 DIAGNOSIS — D649 Anemia, unspecified: Secondary | ICD-10-CM | POA: Insufficient documentation

## 2013-08-25 DIAGNOSIS — Z79899 Other long term (current) drug therapy: Secondary | ICD-10-CM | POA: Insufficient documentation

## 2013-08-25 NOTE — ED Provider Notes (Signed)
CSN: [id number]     Arrival date & time 08/25/13  2209 History  This chart was scribed for Antony Madura, PA, working with Sunnie Nielsen, MD by Blanchard Kelch, ED Scribe. This patient was seen in room WTR8/WTR8 and the patient's care was started at 5:39 PM.    Chief Complaint  Patient presents with  . Wrist Pain    Patient is a 32 y.o. female presenting with wrist pain. The history is provided by the patient. No language interpreter was used.  Wrist Pain This is a new problem. The current episode started 6 to 12 hours ago. The problem occurs constantly. The problem has not changed since onset.The symptoms are aggravated by bending. The symptoms are relieved by ice and NSAIDs.    HPI Comments: Crystal Myers is a 32 y.o. female who presents to the Emergency Department complaining of constant, unchanged right wrist and hand pain that began 11 hours ago. She reports associated swelling to the area. She describes the pain as sharp and throbbing. The pain is worsened with movement and nonradiating. She reports taking ibuprofen and ice for the pain with mild relief. She denies any injury or trauma as well as numbness/tingling, pallor, and weakness.   Past Medical History  Diagnosis Date  . Anemia   . Chlamydia    Past Surgical History  Procedure Laterality Date  . Cesarean section    . Appendectomy    . Tonsillectomy    . Cesarean section      x4  . Tubal ligation     No family history on file. History  Substance Use Topics  . Smoking status: Current Some Day Smoker -- 0.50 packs/day    Types: Cigarettes  . Smokeless tobacco: Never Used  . Alcohol Use: Yes     Comment: occasional   OB History   Grav Para Term Preterm Abortions TAB SAB Ect Mult Living   5 5 3 2      3      Review of Systems  Constitutional: Negative for fever.  Musculoskeletal: Positive for arthralgias.  Skin: Negative for pallor.  Neurological: Negative for weakness and numbness.  All other systems reviewed and  are negative.    Allergies  Augmentin and Percocet  Home Medications   Current Outpatient Rx  Name  Route  Sig  Dispense  Refill  . BIOTIN PO   Oral   Take 1,000 mg by mouth daily.         . butalbital-acetaminophen-caffeine (FIORICET, ESGIC) 50-325-40 MG per tablet   Oral   Take 1 tablet by mouth daily as needed. For migraines          . FLUoxetine (PROZAC) 20 MG capsule   Oral   Take 20 mg by mouth daily.         Marland Kitchen HYDROcodone-acetaminophen (NORCO/VICODIN) 5-325 MG per tablet   Oral   Take 1 tablet by mouth every 4 (four) hours as needed for pain.   20 tablet   0   . Levonorgestrel-Ethinyl Estradiol (SEASONIQUE) 0.15-0.03 &0.01 MG tablet   Oral   Take 1 tablet by mouth daily.           . Multiple Vitamin (MULTIVITAMIN WITH MINERALS) TABS tablet   Oral   Take 1 tablet by mouth daily.         . naproxen (NAPROSYN) 500 MG tablet   Oral   Take 1 tablet (500 mg total) by mouth 2 (two) times daily.   30 tablet  0   . omeprazole (PRILOSEC) 20 MG capsule   Oral   Take 40 mg by mouth as needed (acid reflux).         . topiramate (TOPAMAX) 100 MG tablet   Oral   Take 100 mg by mouth at bedtime.          . meloxicam (MOBIC) 7.5 MG tablet   Oral   Take 2 tablets (15 mg total) by mouth daily.   30 tablet   0   . traMADol (ULTRAM) 50 MG tablet   Oral   Take 1 tablet (50 mg total) by mouth every 6 (six) hours as needed for pain.   7 tablet   0    Triage Vitals: BP 140/97  Pulse 89  Temp(Src) 98.5 F (36.9 C) (Oral)  Resp 20  SpO2 100%  LMP 08/11/2013  Physical Exam  Nursing note and vitals reviewed. Constitutional: She is oriented to person, place, and time. She appears well-developed and well-nourished. No distress.  HENT:  Head: Normocephalic and atraumatic.  Eyes: Conjunctivae and EOM are normal. No scleral icterus.  Neck: Normal range of motion.  Cardiovascular: Normal rate, regular rhythm and intact distal pulses.   Distal radial  pulses 2+ bilaterally. Capillary refill normal.  Pulmonary/Chest: Effort normal. No respiratory distress.  Musculoskeletal: Normal range of motion.       Right wrist: She exhibits tenderness. She exhibits normal range of motion, no bony tenderness, no swelling, no effusion, no crepitus and no deformity.  Right thumb to finger opposition is intact. Equal grip strength bilaterally. Positive Finkelstein's test.   Neurological: She is alert and oriented to person, place, and time.  No sensory or motor deficits appreciated.  Skin: Skin is warm and dry. No rash noted. She is not diaphoretic. No erythema. No pallor.  Psychiatric: She has a normal mood and affect. Her behavior is normal.    ED Course  Procedures (including critical care time)  DIAGNOSTIC STUDIES: Oxygen Saturation is 100% on room air, normal by my interpretation.    COORDINATION OF CARE: 12:03 AM -Clinical suspicion of tendinitis. Patient verbalizes understanding and agrees with treatment plan.  Labs Review Labs Reviewed - No data to display Imaging Review Dg Wrist Complete Right  08/25/2013   CLINICAL DATA:  No known injury. Radial sided pain.  EXAM: RIGHT WRIST - COMPLETE 3+ VIEW  COMPARISON:  None.  FINDINGS: Degenerative changes at the 1st carpometacarpal joint. Small well corticated bone density noted adjacent to the joint may be related to old injury or degenerative changes. No acute bony abnormality. Specifically, no fracture, subluxation, or dislocation. Soft tissues are intact.  IMPRESSION: No acute bony abnormality.   Electronically Signed   By: Charlett Nose M.D.   On: 08/25/2013 23:50    MDM   1. Tenosynovitis of wrist    33 year old female who presents for right wrist pain. Patient well and nontoxic appearing, hemodynamically stable and afebrile. Patient neurovascularly intact. She denies any trauma or injury to her wrist. There is no erythema or heat-to-touch to suspect underlying infectious/septic joint process.  X-ray without any evidence of fracture or dislocation. Physical exam findings consistent with tenosynovitis of the right wrist. Patient given thumb spica splint and ED and given RICE instruction for symptoms. Patient prescribed Mobic to take for inflammation and Ultram for pain as needed. Return precautions discussed and patient are agreeable to plan with no unaddressed concerns.  I personally performed the services described in this documentation, which was scribed in  my presence. The recorded information has been reviewed and is accurate.        Antony Madura, PA-C 08/26/13 1743

## 2013-08-25 NOTE — ED Notes (Signed)
Pt is having pain in R wrist and hand. Swollen. Does not remember injuring hand.

## 2013-08-26 LAB — URINE CULTURE

## 2013-08-26 MED ORDER — TRAMADOL HCL 50 MG PO TABS: 50.0000 mg | ORAL_TABLET | Freq: Four times a day (QID) | ORAL | Status: DC | PRN

## 2013-08-26 MED ORDER — MELOXICAM 7.5 MG PO TABS: 15.0000 mg | ORAL_TABLET | Freq: Every day | ORAL | Status: DC

## 2013-08-26 NOTE — ED Provider Notes (Signed)
Medical screening examination/treatment/procedure(s) were performed by non-physician practitioner and as supervising physician I was immediately available for consultation/collaboration.  Bodey Frizell, MD 08/26/13 2307 

## 2014-06-06 ENCOUNTER — Other Ambulatory Visit: Payer: Self-pay

## 2014-06-06 ENCOUNTER — Emergency Department (HOSPITAL_COMMUNITY)
Admission: EM | Admit: 2014-06-06 | Discharge: 2014-06-07 | Disposition: A | Discharge status: Home / Self Care | Payer: Medicaid Other | Attending: Emergency Medicine | Admitting: Emergency Medicine

## 2014-06-06 DIAGNOSIS — Z8619 Personal history of other infectious and parasitic diseases: Secondary | ICD-10-CM | POA: Insufficient documentation

## 2014-06-06 DIAGNOSIS — Z79899 Other long term (current) drug therapy: Secondary | ICD-10-CM | POA: Diagnosis not present

## 2014-06-06 DIAGNOSIS — Z8709 Personal history of other diseases of the respiratory system: Secondary | ICD-10-CM | POA: Insufficient documentation

## 2014-06-06 DIAGNOSIS — F17200 Nicotine dependence, unspecified, uncomplicated: Secondary | ICD-10-CM | POA: Diagnosis not present

## 2014-06-06 DIAGNOSIS — Z862 Personal history of diseases of the blood and blood-forming organs and certain disorders involving the immune mechanism: Secondary | ICD-10-CM | POA: Diagnosis not present

## 2014-06-06 DIAGNOSIS — J3489 Other specified disorders of nose and nasal sinuses: Secondary | ICD-10-CM | POA: Insufficient documentation

## 2014-06-06 DIAGNOSIS — H9209 Otalgia, unspecified ear: Secondary | ICD-10-CM | POA: Diagnosis not present

## 2014-06-06 DIAGNOSIS — R0789 Other chest pain: Secondary | ICD-10-CM | POA: Insufficient documentation

## 2014-06-06 DIAGNOSIS — H748X9 Other specified disorders of middle ear and mastoid, unspecified ear: Secondary | ICD-10-CM | POA: Insufficient documentation

## 2014-06-06 DIAGNOSIS — H9202 Otalgia, left ear: Secondary | ICD-10-CM

## 2014-06-06 DIAGNOSIS — R0602 Shortness of breath: Secondary | ICD-10-CM | POA: Diagnosis not present

## 2014-06-06 NOTE — ED Provider Notes (Signed)
MSE was initiated and I personally evaluated the patient and placed orders (if any) at  11:37 PM on June 06, 2014.   The patient appears stable so that the remainder of the MSE may be completed by another provider.  Crystal Myers is a 33 y.o. female who presents to the Emergency Department complaining SOB and chest tightness that began 3 hours ago. States that she can't take a deep breath like she wants to. Denies chest pain. Denies history of diabetes, hypertension, hyperlipidemia, blood clots. Denies recent surgery or long distance travel. Pt currently takes birth control medication and smokes cigarettes daily.   She also complains of gradual onset, shooting left ear pain that started 3 days ago. States pain radiates into her neck. Rates pain 9/10.  PE: Gen: A&O x4 HEENT: PERRL, EOM intact, ttp of sub-occipital region CHEST: RRR, no m/r/g LUNGS: CTAB, no w/r/r ABD: BS x 4, ND/NT EXT: No edema, strong peripheral pulses NEURO: Sensation and strength intact bilaterally  Given patient's complaint of SOB with chest tightness that started 3 hours ago, further investigation is warranted.     Roxy Horsemanobert Aliece Honold, PA-C 06/06/14 2340

## 2014-06-06 NOTE — ED Notes (Signed)
Pt. Reports bilateral ear pain, worse in left ear. Also c/o SOB, does not appear in distress at this time. States it is worse when she lies flat

## 2014-06-06 NOTE — ED Notes (Addendum)
Pt reports left sided otalgia, states "it feels kind of full." Denies any drainage, hearing changes. Pt also reports since today "I just can't take a deep breath like I want to." Pt denies CP. Lungs clear/equal. VSS. Denies cough. Denies fever/chills. Denies N/V/D, HA or other complaint.

## 2014-06-07 ENCOUNTER — Emergency Department (HOSPITAL_COMMUNITY): Payer: Medicaid Other

## 2014-06-07 LAB — CBC WITH DIFFERENTIAL/PLATELET
BASOS ABS: 0 10*3/uL (ref 0.0–0.1)
Basophils Relative: 0 % (ref 0–1)
EOS ABS: 0.3 10*3/uL (ref 0.0–0.7)
Eosinophils Relative: 4 % (ref 0–5)
HCT: 36.7 % (ref 36.0–46.0)
HEMOGLOBIN: 11.6 g/dL — AB (ref 12.0–15.0)
Lymphocytes Relative: 37 % (ref 12–46)
Lymphs Abs: 3 10*3/uL (ref 0.7–4.0)
MCH: 19.8 pg — ABNORMAL LOW (ref 26.0–34.0)
MCHC: 31.6 g/dL (ref 30.0–36.0)
MCV: 62.5 fL — ABNORMAL LOW (ref 78.0–100.0)
MONOS PCT: 6 % (ref 3–12)
Monocytes Absolute: 0.5 10*3/uL (ref 0.1–1.0)
NEUTROS PCT: 53 % (ref 43–77)
Neutro Abs: 4.3 10*3/uL (ref 1.7–7.7)
Platelets: 258 10*3/uL (ref 150–400)
RBC: 5.87 MIL/uL — AB (ref 3.87–5.11)
RDW: 16.4 % — ABNORMAL HIGH (ref 11.5–15.5)
WBC: 8.1 10*3/uL (ref 4.0–10.5)

## 2014-06-07 LAB — BASIC METABOLIC PANEL
ANION GAP: 17 — AB (ref 5–15)
BUN: 9 mg/dL (ref 6–23)
CO2: 21 meq/L (ref 19–32)
Calcium: 9.4 mg/dL (ref 8.4–10.5)
Chloride: 102 mEq/L (ref 96–112)
Creatinine, Ser: 0.59 mg/dL (ref 0.50–1.10)
GFR calc non Af Amer: >90 mL/min (ref >90)
Glucose, Bld: 89 mg/dL (ref 70–99)
POTASSIUM: 3.7 meq/L (ref 3.7–5.3)
Sodium: 140 mEq/L (ref 137–147)

## 2014-06-07 LAB — I-STAT TROPONIN, ED: Troponin i, poc: 0 ng/mL (ref 0.00–0.08)

## 2014-06-07 LAB — D-DIMER, QUANTITATIVE (NOT AT ARMC): D DIMER QUANT: 0.31 ug{FEU}/mL (ref 0.00–0.48)

## 2014-06-07 MED ORDER — SALINE SPRAY 0.65 % NA SOLN: 1.0000 | NASAL | Status: DC | PRN

## 2014-06-07 MED ORDER — TRAMADOL HCL 50 MG PO TABS
50.0000 mg | ORAL_TABLET | Freq: Once | ORAL | Status: AC
  Administered 2014-06-07: 50 mg via ORAL
  Filled 2014-06-07: qty 1

## 2014-06-07 MED ORDER — IBUPROFEN 600 MG PO TABS: 600.0000 mg | ORAL_TABLET | Freq: Four times a day (QID) | ORAL | Status: DC | PRN

## 2014-06-07 MED ORDER — ALBUTEROL SULFATE HFA 108 (90 BASE) MCG/ACT IN AERS
1.0000 | INHALATION_SPRAY | RESPIRATORY_TRACT | Status: DC | PRN
  Administered 2014-06-07: 2 via RESPIRATORY_TRACT
  Filled 2014-06-07: qty 6.7

## 2014-06-07 MED ORDER — ANTIPYRINE-BENZOCAINE 5.4-1.4 % OT SOLN: 3.0000 [drp] | OTIC | Status: DC | PRN

## 2014-06-07 NOTE — ED Notes (Signed)
PT monitored by 5-lead, bp cuff, and pulse ox.

## 2014-06-07 NOTE — Discharge Instructions (Signed)
Please follow up with your primary care physician in 1-2 days. If you do not have one please call the [facility] number listed above. Please use medications as prescribed. Please read all discharge instructions and return precautions.   Shortness of Breath Shortness of breath means you have trouble breathing. It could also mean that you have a medical problem. You should get immediate medical care for shortness of breath. CAUSES   Not enough oxygen in the air such as with high altitudes or a smoke-filled room.  Certain lung diseases, infections, or problems.  Heart disease or conditions, such as angina or heart failure.  Low red blood cells (anemia).  Poor physical fitness, which can cause shortness of breath when you exercise.  Chest or back injuries or stiffness.  Being overweight.  Smoking.  Anxiety, which can make you feel like you are not getting enough air. DIAGNOSIS  Serious medical problems can often be found during your physical exam. Tests may also be done to determine why you are having shortness of breath. Tests may include:  Chest X-rays.  Lung function tests.  Blood tests.  An electrocardiogram (ECG).  An ambulatory electrocardiogram. An ambulatory ECG records your heartbeat patterns over a 24-hour period.  Exercise testing.  A transthoracic echocardiogram (TTE). During echocardiography, sound waves are used to evaluate how blood flows through your heart.  A transesophageal echocardiogram (TEE).  Imaging scans. Your health care provider may not be able to find a cause for your shortness of breath after your exam. In this case, it is important to have a follow-up exam with your health care provider as directed.  TREATMENT  Treatment for shortness of breath depends on the cause of your symptoms and can vary greatly. HOME CARE INSTRUCTIONS   Do not smoke. Smoking is a common cause of shortness of breath. If you smoke, ask for help to  quit.  Avoid being around chemicals or things that may bother your breathing, such as paint fumes and dust.  Rest as needed. Slowly resume your usual activities.  If medicines were prescribed, take them as directed for the full length of time directed. This includes oxygen and any inhaled medicines.  Keep all follow-up appointments as directed by your health care provider. SEEK MEDICAL CARE IF:   Your condition does not improve in the time expected.  You have a hard time doing your normal activities even with rest.  You have any new symptoms. SEEK IMMEDIATE MEDICAL CARE IF:   Your shortness of breath gets worse.  You feel light-headed, faint, or develop a cough not controlled with medicines.  You start coughing up blood.  You have pain with breathing.  You have chest pain or pain in your arms, shoulders, or abdomen.  You have a fever.  You are unable to walk up stairs or exercise the way you normally do. MAKE SURE YOU:  Understand these instructions.  Will watch your condition.  Will get help right away if you are not doing well or get worse. Document Released: 08/06/2001 Document Revised: 11/16/2013 Document Reviewed: 01/27/2012 [facility] Patient Information 2015 [place], [place]. This information is not intended to replace advice given to you by your health care provider. Make sure you discuss any questions you have with your health care provider.  Otalgia The most common reason for this in children is an infection of the middle ear. Pain from the middle ear is usually caused by a build-up of fluid and pressure behind the eardrum.  Pain from an earache can be sharp, dull, or burning. The pain may be temporary or constant. The middle ear is connected to the nasal passages by a short narrow tube called the Eustachian tube. The Eustachian tube allows fluid to drain out of the middle ear, and helps keep the pressure in your ear equalized. CAUSES  A cold or allergy can block the  Eustachian tube with inflammation and the build-up of secretions. This is especially likely in small children, because their Eustachian tube is shorter and more horizontal. When the Eustachian tube closes, the normal flow of fluid from the middle ear is stopped. Fluid can accumulate and cause stuffiness, pain, hearing loss, and an ear infection if germs start growing in this area. SYMPTOMS  The symptoms of an ear infection may include fever, ear pain, fussiness, increased crying, and irritability. Many children will have temporary and minor hearing loss during and right after an ear infection. Permanent hearing loss is rare, but the risk increases the more infections a child has. Other causes of ear pain include retained water in the outer ear canal from swimming and bathing. Ear pain in adults is less likely to be from an ear infection. Ear pain may be referred from other locations. Referred pain may be from the joint between your jaw and the skull. It may also come from a tooth problem or problems in the neck. Other causes of ear pain include:  A foreign body in the ear.  Outer ear infection.  Sinus infections.  Impacted ear wax.  Ear injury.  Arthritis of the jaw or TMJ problems.  Middle ear infection.  Tooth infections.  Sore throat with pain to the ears. DIAGNOSIS  Your caregiver can usually make the diagnosis by examining you. Sometimes other special studies, including x-rays and lab work may be necessary. TREATMENT   If antibiotics were prescribed, use them as directed and finish them even if you or your child's symptoms seem to be improved.  Sometimes PE tubes are needed in children. These are little plastic tubes which are put into the eardrum during a simple surgical procedure. They allow fluid to drain easier and allow the pressure in the middle ear to equalize. This helps relieve the ear pain caused by pressure changes. HOME CARE INSTRUCTIONS   Only take over-the-counter or  prescription medicines for pain, discomfort, or fever as directed by your caregiver. DO NOT GIVE CHILDREN ASPIRIN because of the association of Reye's Syndrome in children taking aspirin.  Use a cold pack applied to the outer ear for 15-20 minutes, 03-04 times per day or as needed may reduce pain. Do not apply ice directly to the skin. You may cause frost bite.  Over-the-counter ear drops used as directed may be effective. Your caregiver may sometimes prescribe ear drops.  Resting in an upright position may help reduce pressure in the middle ear and relieve pain.  Ear pain caused by rapidly descending from high altitudes can be relieved by swallowing or chewing gum. Allowing infants to suck on a bottle during airplane travel can help.  Do not smoke in the house or near children. If you are unable to quit smoking, smoke outside.  Control allergies. SEEK IMMEDIATE MEDICAL CARE IF:   You or your child are becoming sicker.  Pain or fever relief is not obtained with medicine.  You or your child's symptoms (pain, fever, or irritability) do not improve within 24 to 48 hours or as instructed.  Severe pain suddenly stops hurting.  This may indicate a ruptured eardrum.  You or your children develop new problems such as severe headaches, stiff neck, difficulty swallowing, or swelling of the face or around the ear. Document Released: 06/28/2004 Document Revised: 02/03/2012 Document Reviewed: 11/02/2008 [facility] Patient Information 2015 [place], [place]. This information is not intended to replace advice given to you by your health care provider. Make sure you discuss any questions you have with your health care provider.

## 2014-06-07 NOTE — ED Provider Notes (Signed)
CSN: [id number]     Arrival date & time 06/06/14  2301 History   First MD Initiated Contact with Patient 06/06/14 2323     Chief Complaint  Patient presents with  . Shortness of Breath  . Chest Pain     (Consider location/radiation/quality/duration/timing/severity/associated sxs/prior Treatment) HPI Comments: Patient is a 33 yo F PMHx significant for anemia, tobacco abuse presenting to the ED for multiple complaints. Patient's first complaint is two to three days of left sided ear pain with "fullness" She is also complaining of some sinus pressure. Patient states this feels like previous sinus infections. Patient is also complaining of acute onset SOB that began this evening. Patient describes it feeling like "just finishing exercising." Alleviating factors: none. Aggravating factors: none. Medications tried prior to arrival: Ibuprofen, Claritin. Denies any fevers, chills, nausea, vomiting, chest pain, diarrhea, headache, drainage from ears. Patient is on OCPs.   Patient is a 33 y.o. female presenting with shortness of breath and chest pain.  Shortness of Breath Associated symptoms: chest pain and ear pain   Associated symptoms: no fever   Chest Pain Associated symptoms: shortness of breath   Associated symptoms: no fever     Past Medical History  Diagnosis Date  . Anemia   . Chlamydia    Past Surgical History  Procedure Laterality Date  . Cesarean section    . Appendectomy    . Tonsillectomy    . Cesarean section      x4  . Tubal ligation     No family history on file. History  Substance Use Topics  . Smoking status: Current Some Day Smoker -- 0.50 packs/day    Types: Cigarettes  . Smokeless tobacco: Never Used  . Alcohol Use: Yes     Comment: occasional   OB History   Grav Para Term Preterm Abortions TAB SAB Ect Mult Living   5 5 3 2      3      Review of Systems  Constitutional: Negative for fever and chills.  HENT: Positive for ear pain.   Respiratory: Positive  for chest tightness and shortness of breath.   Cardiovascular: Positive for chest pain.  All other systems reviewed and are negative.     Allergies  Augmentin and Percocet  Home Medications   Prior to Admission medications   Medication Sig Start Date End Date Taking? Authorizing Provider  BIOTIN PO Take 1,000 mg by mouth daily.   Yes Historical Provider, MD  butalbital-acetaminophen-caffeine (FIORICET, ESGIC) 50-325-40 MG per tablet Take 1 tablet by mouth daily as needed. For migraines    Yes Historical Provider, MD  Levonorgestrel-Ethinyl Estradiol (SEASONIQUE) 0.15-0.03 &0.01 MG tablet Take 1 tablet by mouth daily.     Yes Historical Provider, MD  Multiple Vitamin (MULTIVITAMIN WITH MINERALS) TABS tablet Take 1 tablet by mouth daily.   Yes Historical Provider, MD  antipyrine-benzocaine Lyla Son) otic solution Place 3-4 drops into the left ear every 2 (two) hours as needed for ear pain. 06/07/14   Milani Lowenstein L Caidence Higashi, PA-C  ibuprofen (ADVIL,MOTRIN) 600 MG tablet Take 1 tablet (600 mg total) by mouth every 6 (six) hours as needed. 06/07/14   Ewell Benassi L Danie Diehl, PA-C  sodium chloride (OCEAN) 0.65 % SOLN nasal spray Place 1 spray into both nostrils as needed for congestion. 06/07/14   Jazzlene Huot L Cherylyn Sundby, PA-C   BP 114/68  Pulse 74  Temp(Src) 98.2 F (36.8 C) (Oral)  Resp 22  Wt 240 lb 9 oz (109.118 kg)  SpO2 98%  LMP 05/01/2014 Physical Exam  Nursing note and vitals reviewed. Constitutional: She is oriented to person, place, and time. She appears well-developed and well-nourished. No distress.  HENT:  Head: Normocephalic and atraumatic.  Right Ear: Hearing, tympanic membrane, external ear and ear canal normal. No mastoid tenderness.  Left Ear: Hearing, tympanic membrane, external ear and ear canal normal. No mastoid tenderness.  Nose: No rhinorrhea. Right sinus exhibits maxillary sinus tenderness and frontal sinus tenderness. Left sinus exhibits maxillary sinus tenderness  and frontal sinus tenderness.  Mouth/Throat: Uvula is midline, oropharynx is clear and moist and mucous membranes are normal. No oropharyngeal exudate.  Eyes: Conjunctivae are normal.  Neck: Normal range of motion. Neck supple.  Cardiovascular: Normal rate, regular rhythm, normal heart sounds and intact distal pulses.   Pulmonary/Chest: Effort normal and breath sounds normal. No respiratory distress. She has no wheezes. She has no rales. She exhibits tenderness.  Abdominal: Soft.  Musculoskeletal: Normal range of motion. She exhibits no edema.  Lymphadenopathy:    She has no cervical adenopathy.  Neurological: She is alert and oriented to person, place, and time.  Skin: Skin is warm and dry. She is not diaphoretic.  Psychiatric: She has a normal mood and affect.    ED Course  Procedures (including critical care time) Medications  albuterol (PROVENTIL HFA;VENTOLIN HFA) 108 (90 BASE) MCG/ACT inhaler 1-2 puff (2 puffs Inhalation Given 06/07/14 0259)  traMADol (ULTRAM) tablet 50 mg (50 mg Oral Given 06/07/14 0150)    Labs Review Labs Reviewed  CBC WITH DIFFERENTIAL - Abnormal; Notable for the following:    RBC 5.87 (*)    Hemoglobin 11.6 (*)    MCV 62.5 (*)    MCH 19.8 (*)    RDW 16.4 (*)    All other components within normal limits  BASIC METABOLIC PANEL - Abnormal; Notable for the following:    Anion gap 17 (*)    All other components within normal limits  D-DIMER, QUANTITATIVE  I-STAT TROPOININ, ED    Imaging Review Dg Chest 2 View  06/07/2014   CLINICAL DATA:  chest pain  EXAM: CHEST  2 VIEW  COMPARISON:  Prior study from 10/07/2011  FINDINGS: The cardiac and mediastinal silhouettes are stable in size and contour, and remain within normal limits.  The lungs are normally inflated. Mild chronic peribronchial thickening is present, similar to prior. No airspace consolidation, pleural effusion, or pulmonary edema is identified. There is no pneumothorax.  No acute osseous abnormality  identified.  IMPRESSION: Mild diffuse peribronchial thickening, similar to prior exam. No other acute cardiopulmonary abnormality identified.   Electronically Signed   By: Rise Mu M.D.   On: 06/07/2014 01:26     EKG Interpretation None      MDM   Final diagnoses:  Otalgia, left    Filed Vitals:   06/07/14 0230  BP: 114/68  Pulse: 74  Temp:   Resp: 22   Afebrile, NAD, non-toxic appearing, AAOx4.   1) Otalgia: Patient complaining of symptoms of sinusitis.  Mild to moderate symptoms of congestion and ear pressure for less than 10 days.  Patient is afebrile.  No concern for acute bacterial rhinosinusitis; likely viral in nature. No evidence of otitis media, externa, or mastoditis. Patient discharged with symptomatic treatment.  Patient instructions given for warm saline nasal washes.  Recommendations for follow-up with primary care physician.     2) SOB: Patient is to be discharged with recommendation to follow up with PCP in regards to today's hospital visit.  Chest pain is not likely of cardiac or pulmonary etiology d/t presentation, d-dimer negative, VSS, no tracheal deviation, no JVD or new murmur, RRR, breath sounds equal bilaterally, EKG without acute abnormalities, negative troponin, and negative CXR. Pt has been advised to return to the ED is CP becomes exertional, associated with diaphoresis or nausea, radiates to left jaw/arm, worsens or becomes concerning in any way. Pt appears reliable for follow up and is agreeable to discharge.   Patient is stable at time of discharge     Jeannetta EllisJennifer L Amyra Vantuyl, PA-C 06/07/14 [id number]

## 2014-06-07 NOTE — ED Provider Notes (Signed)
Medical screening examination/treatment/procedure(s) were performed by non-physician practitioner and as supervising physician I was immediately available for consultation/collaboration.    Vida RollerBrian D Yaniris Braddock, MD 06/07/14 [phone]

## 2014-06-07 NOTE — ED Provider Notes (Signed)
Medical screening examination/treatment/procedure(s) were performed by non-physician practitioner and as supervising physician I was immediately available for consultation/collaboration.    Vida RollerBrian D Lidie Glade, MD 06/07/14 [phone]

## 2014-06-07 NOTE — ED Notes (Signed)
EKG given to Dr. Nanavati. 

## 2014-07-24 ENCOUNTER — Emergency Department (HOSPITAL_COMMUNITY)
Admission: EM | Admit: 2014-07-24 | Discharge: 2014-07-25 | Disposition: A | Discharge status: Home / Self Care | Payer: Medicaid Other | Attending: Emergency Medicine | Admitting: Emergency Medicine

## 2014-07-24 ENCOUNTER — Encounter (HOSPITAL_COMMUNITY): Payer: Self-pay | Admitting: Emergency Medicine

## 2014-07-24 DIAGNOSIS — J40 Bronchitis, not specified as acute or chronic: Secondary | ICD-10-CM | POA: Diagnosis not present

## 2014-07-24 DIAGNOSIS — J309 Allergic rhinitis, unspecified: Secondary | ICD-10-CM | POA: Diagnosis not present

## 2014-07-24 DIAGNOSIS — Z862 Personal history of diseases of the blood and blood-forming organs and certain disorders involving the immune mechanism: Secondary | ICD-10-CM | POA: Insufficient documentation

## 2014-07-24 DIAGNOSIS — H9209 Otalgia, unspecified ear: Secondary | ICD-10-CM | POA: Insufficient documentation

## 2014-07-24 DIAGNOSIS — Z8619 Personal history of other infectious and parasitic diseases: Secondary | ICD-10-CM | POA: Insufficient documentation

## 2014-07-24 DIAGNOSIS — IMO0002 Reserved for concepts with insufficient information to code with codable children: Secondary | ICD-10-CM | POA: Insufficient documentation

## 2014-07-24 DIAGNOSIS — F17200 Nicotine dependence, unspecified, uncomplicated: Secondary | ICD-10-CM | POA: Diagnosis not present

## 2014-07-24 DIAGNOSIS — R0602 Shortness of breath: Secondary | ICD-10-CM | POA: Diagnosis present

## 2014-07-24 DIAGNOSIS — Z79899 Other long term (current) drug therapy: Secondary | ICD-10-CM | POA: Insufficient documentation

## 2014-07-24 DIAGNOSIS — J302 Other seasonal allergic rhinitis: Secondary | ICD-10-CM

## 2014-07-24 MED ORDER — PREDNISONE 20 MG PO TABS
60.0000 mg | ORAL_TABLET | Freq: Once | ORAL | Status: AC
  Administered 2014-07-25: 60 mg via ORAL
  Filled 2014-07-24: qty 3

## 2014-07-24 MED ORDER — SALINE SPRAY 0.65 % NA SOLN
1.0000 | Freq: Once | NASAL | Status: AC
  Administered 2014-07-25: 1 via NASAL
  Filled 2014-07-24: qty 44

## 2014-07-24 NOTE — ED Notes (Signed)
Patient arrives with complaint of shortness of breath accompanied by congestion and ear pressure. States chills at home but temp was not measured. Explains that she has history of allergies which she is supposed to be on injections to control but has been unable to get. Endorses previous episode of the same which required treatment with steroids.

## 2014-07-25 ENCOUNTER — Emergency Department (HOSPITAL_COMMUNITY): Payer: Medicaid Other

## 2014-07-25 MED ORDER — ALBUTEROL SULFATE HFA 108 (90 BASE) MCG/ACT IN AERS: 2.0000 | INHALATION_SPRAY | RESPIRATORY_TRACT | Status: DC | PRN

## 2014-07-25 MED ORDER — LORATADINE 10 MG PO TABS: 10.0000 mg | ORAL_TABLET | Freq: Every day | ORAL | Status: DC

## 2014-07-25 MED ORDER — PREDNISONE 20 MG PO TABS: 60.0000 mg | ORAL_TABLET | Freq: Every day | ORAL | Status: DC

## 2014-07-25 NOTE — Discharge Instructions (Signed)
Allergies °Allergies may happen from anything your body is sensitive to. This may be food, medicines, pollens, chemicals, and nearly anything around you in everyday life that produces allergens. An allergen is anything that causes an allergy producing substance. Heredity is often a factor in causing these problems. This means you may have some of the same allergies as your parents. °Food allergies happen in all age groups. Food allergies are some of the most severe and life threatening. Some common food allergies are cow's milk, seafood, eggs, nuts, wheat, and soybeans. °SYMPTOMS  °· Swelling around the mouth. °· An itchy red rash or hives. °· Vomiting or diarrhea. °· Difficulty breathing. °SEVERE ALLERGIC REACTIONS ARE LIFE-THREATENING. °This reaction is called anaphylaxis. It can cause the mouth and throat to swell and cause difficulty with breathing and swallowing. In severe reactions only a trace amount of food (for example, peanut oil in a salad) may cause death within seconds. °Seasonal allergies occur in all age groups. These are seasonal because they usually occur during the same season every year. They may be a reaction to molds, grass pollens, or tree pollens. Other causes of problems are house dust mite allergens, pet dander, and mold spores. The symptoms often consist of nasal congestion, a runny itchy nose associated with sneezing, and tearing itchy eyes. There is often an associated itching of the mouth and ears. The problems happen when you come in contact with pollens and other allergens. Allergens are the particles in the air that the body reacts to with an allergic reaction. This causes you to release allergic antibodies. Through a chain of events, these eventually cause you to release histamine into the blood stream. Although it is meant to be protective to the body, it is this release that causes your discomfort. This is why you were given anti-histamines to feel better.  If you are unable to  pinpoint the offending allergen, it may be determined by skin or blood testing. Allergies cannot be cured but can be controlled with medicine. °Hay fever is a collection of all or some of the seasonal allergy problems. It may often be treated with simple over-the-counter medicine such as diphenhydramine. Take medicine as directed. Do not drink alcohol or drive while taking this medicine. Check with your caregiver or package insert for child dosages. °If these medicines are not effective, there are many new medicines your caregiver can prescribe. Stronger medicine such as nasal spray, eye drops, and corticosteroids may be used if the first things you try do not work well. Other treatments such as immunotherapy or desensitizing injections can be used if all else fails. Follow up with your caregiver if problems continue. These seasonal allergies are usually not life threatening. They are generally more of a nuisance that can often be handled using medicine. °HOME CARE INSTRUCTIONS  °· If unsure what causes a reaction, keep a diary of foods eaten and symptoms that follow. Avoid foods that cause reactions. °· If hives or rash are present: °¨ Take medicine as directed. °¨ You may use an over-the-counter antihistamine (diphenhydramine) for hives and itching as needed. °¨ Apply cold compresses (cloths) to the skin or take baths in cool water. Avoid hot baths or showers. Heat will make a rash and itching worse. °· If you are severely allergic: °¨ Following a treatment for a severe reaction, hospitalization is often required for closer follow-up. °¨ Wear a medic-alert bracelet or necklace stating the allergy. °¨ You and your family must learn how to give adrenaline or use   an anaphylaxis kit.  If you have had a severe reaction, always carry your anaphylaxis kit or EpiPen with you. Use this medicine as directed by your caregiver if a severe reaction is occurring. Failure to do so could have a fatal outcome. SEEK MEDICAL  CARE IF:  You suspect a food allergy. Symptoms generally happen within 30 minutes of eating a food.  Your symptoms have not gone away within 2 days or are getting worse.  You develop new symptoms.  You want to retest yourself or your child with a food or drink you think causes an allergic reaction. Never do this if an anaphylactic reaction to that food or drink has happened before. Only do this under the care of a caregiver. SEEK IMMEDIATE MEDICAL CARE IF:   You have difficulty breathing, are wheezing, or have a tight feeling in your chest or throat.  You have a swollen mouth, or you have hives, swelling, or itching all over your body.  You have had a severe reaction that has responded to your anaphylaxis kit or an EpiPen. These reactions may return when the medicine has worn off. These reactions should be considered life threatening. MAKE SURE YOU:   Understand these instructions.  Will watch your condition.  Will get help right away if you are not doing well or get worse. Document Released: 02/04/2003 Document Revised: 03/08/2013 Document Reviewed: 07/11/2008 [facility] Patient Information 2015 [place], [place]. This information is not intended to replace advice given to you by your health care provider. Make sure you discuss any questions you have with your health care provider. Acute Bronchitis Bronchitis is inflammation of the airways that extend from the windpipe into the lungs (bronchi). The inflammation often causes mucus to develop. This leads to a cough, which is the most common symptom of bronchitis.  In acute bronchitis, the condition usually develops suddenly and goes away over time, usually in a couple weeks. Smoking, allergies, and asthma can make bronchitis worse. Repeated episodes of bronchitis may cause further lung problems.  CAUSES Acute bronchitis is most often caused by the same virus that causes a cold. The virus can spread from person to person (contagious) through  coughing, sneezing, and touching contaminated objects. SIGNS AND SYMPTOMS   Cough.   Fever.   Coughing up mucus.   Body aches.   Chest congestion.   Chills.   Shortness of breath.   Sore throat.  DIAGNOSIS  Acute bronchitis is usually diagnosed through a physical exam. Your health care provider will also ask you questions about your medical history. Tests, such as chest X-rays, are sometimes done to rule out other conditions.  TREATMENT  Acute bronchitis usually goes away in a couple weeks. Oftentimes, no medical treatment is necessary. Medicines are sometimes given for relief of fever or cough. Antibiotic medicines are usually not needed but may be prescribed in certain situations. In some cases, an inhaler may be recommended to help reduce shortness of breath and control the cough. A cool mist vaporizer may also be used to help thin bronchial secretions and make it easier to clear the chest.  HOME CARE INSTRUCTIONS  Get plenty of rest.   Drink enough fluids to keep your urine clear or pale yellow (unless you have a medical condition that requires fluid restriction). Increasing fluids may help thin your respiratory secretions (sputum) and reduce chest congestion, and it will prevent dehydration.   Take medicines only as directed by your health care provider.  If you were prescribed an antibiotic  medicine, finish it all even if you start to feel better.  Avoid smoking and secondhand smoke. Exposure to cigarette smoke or irritating chemicals will make bronchitis worse. If you are a smoker, consider using nicotine gum or skin patches to help control withdrawal symptoms. Quitting smoking will help your lungs heal faster.   Reduce the chances of another bout of acute bronchitis by washing your hands frequently, avoiding people with cold symptoms, and trying not to touch your hands to your mouth, nose, or eyes.   Keep all follow-up visits as directed by your health care  provider.  SEEK MEDICAL CARE IF: Your symptoms do not improve after 1 week of treatment.  SEEK IMMEDIATE MEDICAL CARE IF:  You develop an increased fever or chills.   You have chest pain.   You have severe shortness of breath.  You have bloody sputum.   You develop dehydration.  You faint or repeatedly feel like you are going to pass out.  You develop repeated vomiting.  You develop a severe headache. MAKE SURE YOU:   Understand these instructions.  Will watch your condition.  Will get help right away if you are not doing well or get worse. Document Released: 12/19/2004 Document Revised: 03/28/2014 Document Reviewed: 05/04/2013 [facility] Patient Information 2015 [place], [place]. This information is not intended to replace advice given to you by your health care provider. Make sure you discuss any questions you have with your health care provider.

## 2014-07-25 NOTE — ED Provider Notes (Signed)
CSN: [id number]     Arrival date & time 07/24/14  2310 History   First MD Initiated Contact with Patient 07/24/14 2333     Chief Complaint  Patient presents with  . Shortness of Breath  . Nasal Congestion  . Otalgia     (Consider location/radiation/quality/duration/timing/severity/associated sxs/prior Treatment) HPI  This is a 33 year old female with a history of seasonal allergies and a current smoker who presents with cough or shortness of breath, and ear pressure. Patient reports subjective chills at home without fever.  Patient reports symptoms for 2-3 days. She reports a nonproductive cough associated with shortness of breath. She is a current smoker. Reports your pressure without pain. Also endorses rhinorrhea. Denies any sore throat. Denies any chest pain, abdominal pain, nausea vomiting, or diarrhea.  Patient states that she is taking Mucinex and Benadryl without relief. She's also taking Flonase.  Past Medical History  Diagnosis Date  . Anemia   . Chlamydia    Past Surgical History  Procedure Laterality Date  . Cesarean section    . Appendectomy    . Tonsillectomy    . Cesarean section      x4  . Tubal ligation     History reviewed. No pertinent family history. History  Substance Use Topics  . Smoking status: Current Some Day Smoker -- 0.50 packs/day    Types: Cigarettes  . Smokeless tobacco: Never Used  . Alcohol Use: Yes     Comment: occasional   OB History   Grav Para Term Preterm Abortions TAB SAB Ect Mult Living   Review of Systems  Constitutional: Positive for chills. Negative for fever.  HENT: Positive for ear pain and rhinorrhea. Negative for sinus pressure and sore throat.   Respiratory: Positive for cough and shortness of breath. Negative for chest tightness.   Cardiovascular: Negative for chest pain and leg swelling.  Gastrointestinal: Negative for nausea, vomiting and abdominal pain.  Genitourinary: Negative for dysuria.   Skin: Negative for wound.  Neurological: Negative for headaches.  All other systems reviewed and are negative.     Allergies  Augmentin and Percocet  Home Medications   Prior to Admission medications   Medication Sig Start Date End Date Taking? Authorizing Provider  guaiFENesin (MUCINEX) 600 MG 12 hr tablet Take 600 mg by mouth 2 (two) times daily as needed for cough or to loosen phlegm.   Yes Historical Provider, MD  ibuprofen (ADVIL,MOTRIN) 600 MG tablet Take 1 tablet (600 mg total) by mouth every 6 (six) hours as needed. 06/07/14  Yes Jennifer L Piepenbrink, PA-C  Levonorgestrel-Ethinyl Estradiol (SEASONIQUE) 0.15-0.03 &0.01 MG tablet Take 1 tablet by mouth daily.     Yes Historical Provider, MD  loratadine (CLARITIN) 10 MG tablet Take 10 mg by mouth daily as needed for allergies.   Yes Historical Provider, MD  Multiple Vitamin (MULTIVITAMIN WITH MINERALS) TABS tablet Take 1 tablet by mouth daily. Herbal Life   Yes Historical Provider, MD  albuterol (PROVENTIL HFA;VENTOLIN HFA) 108 (90 BASE) MCG/ACT inhaler Inhale 2 puffs into the lungs every 4 (four) hours as needed for wheezing or shortness of breath. 07/25/14   Shon Baton, MD  loratadine (CLARITIN) 10 MG tablet Take 1 tablet (10 mg total) by mouth daily. 07/25/14   Shon Baton, MD  predniSONE (DELTASONE) 20 MG tablet Take 3 tablets (60 mg total) by mouth daily with breakfast. 07/25/14   Shon Baton,  MD   BP 143/93  Pulse 108  Temp(Src) 98.3 F (36.8 C) (Oral)  Resp 24  SpO2 100%  LMP 04/23/2014 Physical Exam  Nursing note and vitals reviewed. Constitutional: She is oriented to person, place, and time. She appears well-developed and well-nourished. No distress.  Overweight  HENT:  Head: Normocephalic and atraumatic.  Dullness of the bilateral TMs, no purulent effusions noted  Eyes: Pupils are equal, round, and reactive to light.  Neck: Neck supple.  Cardiovascular: Normal rate, regular rhythm and normal  heart sounds.   No murmur heard. Pulmonary/Chest: Effort normal. No respiratory distress. She has wheezes.  Scant expiratory wheezing  Abdominal: Soft. Bowel sounds are normal. There is no tenderness. There is no rebound.  Musculoskeletal: She exhibits no edema.  Lymphadenopathy:    She has cervical adenopathy.  Neurological: She is alert and oriented to person, place, and time.  Skin: Skin is warm and dry.  Psychiatric: She has a normal mood and affect.    ED Course  Procedures (including critical care time) Labs Review Labs Reviewed - No data to display  Imaging Review Dg Chest 2 View  07/25/2014   CLINICAL DATA:  SHORTNESS OF BREATH NASAL CONGESTION OTALGIA  EXAM: CHEST - 2 VIEW  COMPARISON:  06/07/2014  FINDINGS: Prominent interstitial markings in the lung bases as before. No focal infiltrate or overt edema. Heart size upper limits normal for technique. No effusion. Visualized skeletal structures are unremarkable.  IMPRESSION: No acute cardiopulmonary disease.   Electronically Signed   By: Oley Balm M.D.   On: 07/25/2014 00:26     EKG Interpretation None      MDM   Final diagnoses:  Seasonal allergies  Bronchitis   Patient presents with cough, shortness of breath, congestion. Consistent with prior episodes of seasonal allergies. Is nontoxic on exam. He is afebrile. Scant wheezing on exam. Patient is a current smoker. Chest x-ray obtained given history of chills. No evidence of pneumonia. Suspect bronchitis in the setting of seasonal allergies. Patient was given one dose of 60 mg of prednisone. We'll optimize nasal saline, Flonase, Claritin, and a short course of prednisone. Patient will also be given a prescription for inhaler given her cough. She will followup with her primary physician and allergist.  After history, exam, and medical workup I feel the patient has been appropriately medically screened and is safe for discharge home. Pertinent diagnoses were discussed  with the patient. Patient was given return precautions.     Shon Baton, MD 07/25/14 [phone]

## 2014-09-26 ENCOUNTER — Encounter (HOSPITAL_COMMUNITY): Payer: Self-pay | Admitting: Emergency Medicine

## 2014-10-16 ENCOUNTER — Encounter (HOSPITAL_COMMUNITY): Payer: Self-pay

## 2014-10-16 ENCOUNTER — Emergency Department (HOSPITAL_COMMUNITY)
Admission: EM | Admit: 2014-10-16 | Discharge: 2014-10-17 | Disposition: A | Discharge status: Home / Self Care | Payer: Medicaid Other | Attending: Emergency Medicine | Admitting: Emergency Medicine

## 2014-10-16 ENCOUNTER — Emergency Department (HOSPITAL_COMMUNITY): Payer: Medicaid Other

## 2014-10-16 DIAGNOSIS — Z3202 Encounter for pregnancy test, result negative: Secondary | ICD-10-CM | POA: Diagnosis not present

## 2014-10-16 DIAGNOSIS — N939 Abnormal uterine and vaginal bleeding, unspecified: Secondary | ICD-10-CM | POA: Diagnosis not present

## 2014-10-16 DIAGNOSIS — Z793 Long term (current) use of hormonal contraceptives: Secondary | ICD-10-CM | POA: Insufficient documentation

## 2014-10-16 DIAGNOSIS — R102 Pelvic and perineal pain: Secondary | ICD-10-CM | POA: Diagnosis not present

## 2014-10-16 DIAGNOSIS — R109 Unspecified abdominal pain: Secondary | ICD-10-CM | POA: Diagnosis present

## 2014-10-16 DIAGNOSIS — Z862 Personal history of diseases of the blood and blood-forming organs and certain disorders involving the immune mechanism: Secondary | ICD-10-CM | POA: Diagnosis not present

## 2014-10-16 DIAGNOSIS — Z720 Tobacco use: Secondary | ICD-10-CM | POA: Diagnosis not present

## 2014-10-16 DIAGNOSIS — Z8619 Personal history of other infectious and parasitic diseases: Secondary | ICD-10-CM | POA: Insufficient documentation

## 2014-10-16 DIAGNOSIS — Z79899 Other long term (current) drug therapy: Secondary | ICD-10-CM | POA: Insufficient documentation

## 2014-10-16 LAB — BASIC METABOLIC PANEL
ANION GAP: 16 — AB (ref 5–15)
BUN: 8 mg/dL (ref 6–23)
CALCIUM: 9.1 mg/dL (ref 8.4–10.5)
CO2: 19 mEq/L (ref 19–32)
Chloride: 105 mEq/L (ref 96–112)
Creatinine, Ser: 0.61 mg/dL (ref 0.50–1.10)
Glucose, Bld: 101 mg/dL — ABNORMAL HIGH (ref 70–99)
Potassium: 4 mEq/L (ref 3.7–5.3)
SODIUM: 140 meq/L (ref 137–147)

## 2014-10-16 LAB — URINALYSIS, ROUTINE W REFLEX MICROSCOPIC
BILIRUBIN URINE: NEGATIVE
Glucose, UA: NEGATIVE mg/dL
Ketones, ur: NEGATIVE mg/dL
Leukocytes, UA: NEGATIVE
Nitrite: NEGATIVE
PROTEIN: NEGATIVE mg/dL
Specific Gravity, Urine: 1.002 — ABNORMAL LOW (ref 1.005–1.030)
UROBILINOGEN UA: 0.2 mg/dL (ref 0.0–1.0)
pH: 6 (ref 5.0–8.0)

## 2014-10-16 LAB — WET PREP, GENITAL
Clue Cells Wet Prep HPF POC: NONE SEEN
Trich, Wet Prep: NONE SEEN
Yeast Wet Prep HPF POC: NONE SEEN

## 2014-10-16 LAB — CBC WITH DIFFERENTIAL/PLATELET
BASOS ABS: 0.1 10*3/uL (ref 0.0–0.1)
Basophils Relative: 1 % (ref 0–1)
Eosinophils Absolute: 0.3 10*3/uL (ref 0.0–0.7)
Eosinophils Relative: 4 % (ref 0–5)
HCT: 35.7 % — ABNORMAL LOW (ref 36.0–46.0)
Hemoglobin: 11.1 g/dL — ABNORMAL LOW (ref 12.0–15.0)
Lymphocytes Relative: 33 % (ref 12–46)
Lymphs Abs: 2.6 10*3/uL (ref 0.7–4.0)
MCH: 19.6 pg — ABNORMAL LOW (ref 26.0–34.0)
MCHC: 31.1 g/dL (ref 30.0–36.0)
MCV: 63.2 fL — AB (ref 78.0–100.0)
MONO ABS: 0.4 10*3/uL (ref 0.1–1.0)
MONOS PCT: 5 % (ref 3–12)
NEUTROS PCT: 57 % (ref 43–77)
Neutro Abs: 4.5 10*3/uL (ref 1.7–7.7)
PLATELETS: 267 10*3/uL (ref 150–400)
RBC: 5.65 MIL/uL — ABNORMAL HIGH (ref 3.87–5.11)
RDW: 15.5 % (ref 11.5–15.5)
WBC: 7.9 10*3/uL (ref 4.0–10.5)

## 2014-10-16 LAB — URINE MICROSCOPIC-ADD ON

## 2014-10-16 LAB — PREGNANCY, URINE: PREG TEST UR: NEGATIVE

## 2014-10-16 MED ORDER — SODIUM CHLORIDE 0.9 % IV BOLUS (SEPSIS)
1000.0000 mL | Freq: Once | INTRAVENOUS | Status: AC
  Administered 2014-10-16: 1000 mL via INTRAVENOUS

## 2014-10-16 MED ORDER — HYDROMORPHONE HCL 1 MG/ML IJ SOLN
1.0000 mg | Freq: Once | INTRAMUSCULAR | Status: AC
  Administered 2014-10-16: 1 mg via INTRAVENOUS
  Filled 2014-10-16: qty 1

## 2014-10-16 MED ORDER — MORPHINE SULFATE 4 MG/ML IJ SOLN
4.0000 mg | Freq: Once | INTRAMUSCULAR | Status: AC
  Administered 2014-10-16: 4 mg via INTRAVENOUS
  Filled 2014-10-16: qty 1

## 2014-10-16 MED ORDER — ONDANSETRON HCL 4 MG/2ML IJ SOLN
4.0000 mg | Freq: Once | INTRAMUSCULAR | Status: AC
  Administered 2014-10-16: 4 mg via INTRAVENOUS
  Filled 2014-10-16: qty 2

## 2014-10-16 NOTE — ED Provider Notes (Signed)
CSN: [id number]     Arrival date & time 10/16/14  2010 History   First MD Initiated Contact with Patient 10/16/14 2131     Chief Complaint  Patient presents with  . Abdominal Pain  . Vaginal Bleeding     (Consider location/radiation/quality/duration/timing/severity/associated sxs/prior Treatment) HPI Comments: Patient is a 33 year old female with a past medical history of anemia who presents with abdominal pain for the past 5 weeks. The pain is located in her lower abdomen and does not radiate. The pain is described as cramping and severe. The pain started gradually and progressively worsened since the onset. No alleviating/aggravating factors. The patient has tried nothing for symptoms without relief. Associated symptoms include vaginal bleeding for 5 weeks. Patient came to the ED today because of worsening pain and increasingly heavier bleeding. Patient denies fever, headache, NVD, chest pain, SOB, dysuria, constipation. Patient reports having a tubal ligation 5 years ago and takes oral birth control pills.   Patient is a 33 y.o. female presenting with abdominal pain and vaginal bleeding.  Abdominal Pain Associated symptoms: vaginal bleeding   Associated symptoms: no chest pain, no chills, no diarrhea, no dysuria, no fatigue, no fever, no nausea, no shortness of breath and no vomiting   Vaginal Bleeding Associated symptoms: abdominal pain   Associated symptoms: no dizziness, no dysuria, no fatigue, no fever and no nausea     Past Medical History  Diagnosis Date  . Anemia   . Chlamydia    Past Surgical History  Procedure Laterality Date  . Cesarean section    . Appendectomy    . Tonsillectomy    . Cesarean section      x4  . Tubal ligation     History reviewed. No pertinent family history. History  Substance Use Topics  . Smoking status: Current Some Day Smoker -- 0.50 packs/day    Types: Cigarettes  . Smokeless tobacco: Never Used  . Alcohol Use: Yes     Comment:  occasional   OB History    Gravida Para Term Preterm AB TAB SAB Ectopic Multiple Living   5 5 3 2      3      Review of Systems  Constitutional: Negative for fever, chills and fatigue.  HENT: Negative for trouble swallowing.   Eyes: Negative for visual disturbance.  Respiratory: Negative for shortness of breath.   Cardiovascular: Negative for chest pain and palpitations.  Gastrointestinal: Positive for abdominal pain. Negative for nausea, vomiting and diarrhea.  Genitourinary: Positive for vaginal bleeding. Negative for dysuria and difficulty urinating.  Musculoskeletal: Negative for arthralgias and neck pain.  Skin: Negative for color change.  Neurological: Negative for dizziness and weakness.  Psychiatric/Behavioral: Negative for dysphoric mood.      Allergies  Augmentin and Percocet  Home Medications   Prior to Admission medications   Medication Sig Start Date End Date Taking? Authorizing Provider  albuterol (PROVENTIL HFA;VENTOLIN HFA) 108 (90 BASE) MCG/ACT inhaler Inhale 2 puffs into the lungs every 4 (four) hours as needed for wheezing or shortness of breath. 07/25/14  Yes Shon Batonourtney F Horton, MD  EPINEPHrine (EPIPEN 2-PAK) 0.3 mg/0.3 mL IJ SOAJ injection Inject 1 Dose as directed as needed. 08/19/14  Yes Historical Provider, MD  FLUoxetine (PROZAC) 20 MG capsule Take 20 mg by mouth at bedtime.   Yes Historical Provider, MD  gabapentin (NEURONTIN) 300 MG capsule Take 300 mg by mouth at bedtime.   Yes Historical Provider, MD  ibuprofen (ADVIL,MOTRIN) 600 MG tablet Take 1  tablet (600 mg total) by mouth every 6 (six) hours as needed. 06/07/14  Yes Jennifer L Piepenbrink, PA-C  Levonorgestrel-Ethinyl Estradiol (SEASONIQUE) 0.15-0.03 &0.01 MG tablet Take 1 tablet by mouth daily.     Yes Historical Provider, MD  montelukast (SINGULAIR) 10 MG tablet Take 10 mg by mouth at bedtime.   Yes Historical Provider, MD  Olopatadine HCl (PATADAY) 0.2 % SOLN Place 1 drop into both eyes as needed  (allergies).  08/19/14  Yes Historical Provider, MD  omeprazole (PRILOSEC) 40 MG capsule Take 40 mg by mouth daily. 08/19/14  Yes Historical Provider, MD  PATANASE 0.6 % SOLN Place 1 spray into both nostrils as needed. 08/22/14  Yes Historical Provider, MD  loratadine (CLARITIN) 10 MG tablet Take 1 tablet (10 mg total) by mouth daily. Patient not taking: Reported on 10/16/2014 07/25/14   Shon Batonourtney F Horton, MD  predniSONE (DELTASONE) 20 MG tablet Take 3 tablets (60 mg total) by mouth daily with breakfast. Patient not taking: Reported on 10/16/2014 07/25/14   Shon Batonourtney F Horton, MD   BP 113/82 mmHg  Pulse 101  Temp(Src) 98.1 F (36.7 C)  Resp 20  Ht 5\' 9"  (1.753 m)  Wt 225 lb (102.059 kg)  BMI 33.21 kg/m2  SpO2 100%  LMP  (LMP Unknown) Physical Exam  Constitutional: She is oriented to person, place, and time. She appears well-developed and well-nourished. No distress.  HENT:  Head: Normocephalic and atraumatic.  Eyes: Conjunctivae and EOM are normal.  Neck: Normal range of motion.  Cardiovascular: Regular rhythm.  Exam reveals no gallop and no friction rub.   No murmur heard. tachycardic  Pulmonary/Chest: Effort normal and breath sounds normal. She has no wheezes. She has no rales. She exhibits no tenderness.  Abdominal: Soft. She exhibits no distension. There is tenderness. There is no rebound and no guarding.  Genitourinary: Vagina normal.  Normal external genitalia. Thick yellowish mucous and moderate amount of blood in vagina. No CMT. Generalized tenderness to palpation on bimanual exam.   Musculoskeletal: Normal range of motion.  Neurological: She is alert and oriented to person, place, and time. Coordination normal.  Speech is goal-oriented. Moves limbs without ataxia.   Skin: Skin is warm and dry.  Psychiatric: She has a normal mood and affect. Her behavior is normal.  Nursing note and vitals reviewed.   ED Course  Procedures (including critical care time) Labs Review Labs  Reviewed  WET PREP, GENITAL - Abnormal; Notable for the following:    WBC, Wet Prep HPF POC MODERATE (*)    All other components within normal limits  URINALYSIS, ROUTINE W REFLEX MICROSCOPIC - Abnormal; Notable for the following:    Color, Urine STRAW (*)    Specific Gravity, Urine 1.002 (*)    Hgb urine dipstick MODERATE (*)    All other components within normal limits  URINE MICROSCOPIC-ADD ON - Abnormal; Notable for the following:    Squamous Epithelial / LPF FEW (*)    All other components within normal limits  CBC WITH DIFFERENTIAL - Abnormal; Notable for the following:    RBC 5.65 (*)    Hemoglobin 11.1 (*)    HCT 35.7 (*)    MCV 63.2 (*)    MCH 19.6 (*)    All other components within normal limits  BASIC METABOLIC PANEL - Abnormal; Notable for the following:    Glucose, Bld 101 (*)    Anion gap 16 (*)    All other components within normal limits  GC/CHLAMYDIA PROBE AMP  PREGNANCY, URINE    Imaging Review US Transvaginal Non-ob  10/16/2014   CLINICAL DATA:  Vaginal pain and bleeding for 5 weeks  EXAM: TRANSABDOMINAL AND TRANSVAGINAL ULTRASOUND OF PELVIS  TECHNIQUE: Both transabdominal and transvaginal ultrasound examinations of the pelvis were performed. Transabdominal technique was performed for global imaging of the pelvis including uterus, ovaries, adnexal regions, and pelvic cul-de-sac. It was necessary to proceed with endovaginal exam following the transabdominal exam to visualize the uterus and endometrium.  COMPARISON:  None  FINDINGS: Uterus  Measurements: 7.9 x 4.5 x 5.7 cm. No fibroids or other mass visualized.  Endometrium  Thickness: 3.5 mm.  No focal abnormality visualized.  Right ovary  Measurements: 2.6 x 2.4 x 2.0 cm. Normal appearance/no adnexal mass.  Left ovary  Measurements: 3.1 x 1.8 x 2.5 cm. Normal appearance/no adnexal mass.  Other findings  No free fluid.  IMPRESSION: 1. Normal exam.   Electronically Signed   By: Signa Kell M.D.   On: 10/16/2014 23:54    US Pelvis Complete  10/16/2014   CLINICAL DATA:  Vaginal pain and bleeding for 5 weeks  EXAM: TRANSABDOMINAL AND TRANSVAGINAL ULTRASOUND OF PELVIS  TECHNIQUE: Both transabdominal and transvaginal ultrasound examinations of the pelvis were performed. Transabdominal technique was performed for global imaging of the pelvis including uterus, ovaries, adnexal regions, and pelvic cul-de-sac. It was necessary to proceed with endovaginal exam following the transabdominal exam to visualize the uterus and endometrium.  COMPARISON:  None  FINDINGS: Uterus  Measurements: 7.9 x 4.5 x 5.7 cm. No fibroids or other mass visualized.  Endometrium  Thickness: 3.5 mm.  No focal abnormality visualized.  Right ovary  Measurements: 2.6 x 2.4 x 2.0 cm. Normal appearance/no adnexal mass.  Left ovary  Measurements: 3.1 x 1.8 x 2.5 cm. Normal appearance/no adnexal mass.  Other findings  No free fluid.  IMPRESSION: 1. Normal exam.   Electronically Signed   By: Signa Kell M.D.   On: 10/16/2014 23:54     EKG Interpretation None      MDM   Final diagnoses:  Vaginal bleeding  Vaginal pain    10:48 PM Urinalysis and labs unremarkable for acute changes. Vitals stable and patient afebrile. Mild tachycardia likely related to pain.   12:10 AM Patient's US unremarkable for acute changes. Patient will be discharged with pain medication and instructed to follow up at [facility] hospital outpatient clinic. Vitals stable and patient afebrile.   Emilia Beck, PA-C 10/17/14 0011  Elwin Mocha, MD 10/18/14 [phone]

## 2014-10-16 NOTE — ED Notes (Signed)
[place]oke to [place], [place], in regards to patient's complaint of pain. She acknowledges, no verbal orders at this time.

## 2014-10-16 NOTE — ED Notes (Signed)
Pt presents with suprapubic cramping and vaginal bleeding x5 weeks. Pt states her vaginal bleeding goes from dark brown to bright red with a mucous like texture, started as a light flow and progressively worsened into a heavy flow, pt states she takes oral birth control and a hx of tubal ligation in January 2010. Pt estimates changing her pad every 1-2 hours most days.

## 2014-10-16 NOTE — ED Notes (Signed)
Pt also states it is painful to have vaginal intercourse

## 2014-10-17 MED ORDER — HYDROCODONE-ACETAMINOPHEN 5-325 MG PO TABS
2.0000 | ORAL_TABLET | ORAL | Status: DC | PRN
Start: 2014-10-17 — End: 2015-03-16

## 2014-10-17 MED ORDER — ONDANSETRON 4 MG PO TBDP: 4.0000 mg | ORAL_TABLET | Freq: Three times a day (TID) | ORAL | Status: DC | PRN

## 2014-10-17 NOTE — Discharge Instructions (Signed)
Take vicodin as needed for pain. Take zofran as needed for nausea. Refer to attached documents for more information. [facility].

## 2014-10-18 LAB — GC/CHLAMYDIA PROBE AMP
CT Probe RNA: NEGATIVE
GC Probe RNA: NEGATIVE

## 2014-12-13 ENCOUNTER — Encounter (HOSPITAL_COMMUNITY): Payer: Self-pay | Admitting: Emergency Medicine

## 2014-12-13 ENCOUNTER — Emergency Department (HOSPITAL_COMMUNITY)
Admission: EM | Admit: 2014-12-13 | Discharge: 2014-12-14 | Disposition: A | Discharge status: Home / Self Care | Payer: Medicaid Other | Attending: Emergency Medicine | Admitting: Emergency Medicine

## 2014-12-13 DIAGNOSIS — T7840XA Allergy, unspecified, initial encounter: Secondary | ICD-10-CM

## 2014-12-13 DIAGNOSIS — Y9389 Activity, other specified: Secondary | ICD-10-CM | POA: Diagnosis not present

## 2014-12-13 DIAGNOSIS — Z880 Allergy status to penicillin: Secondary | ICD-10-CM | POA: Insufficient documentation

## 2014-12-13 DIAGNOSIS — Z862 Personal history of diseases of the blood and blood-forming organs and certain disorders involving the immune mechanism: Secondary | ICD-10-CM | POA: Diagnosis not present

## 2014-12-13 DIAGNOSIS — Y9289 Other specified places as the place of occurrence of the external cause: Secondary | ICD-10-CM | POA: Insufficient documentation

## 2014-12-13 DIAGNOSIS — X58XXXA Exposure to other specified factors, initial encounter: Secondary | ICD-10-CM | POA: Diagnosis not present

## 2014-12-13 DIAGNOSIS — Y998 Other external cause status: Secondary | ICD-10-CM | POA: Insufficient documentation

## 2014-12-13 DIAGNOSIS — Z87891 Personal history of nicotine dependence: Secondary | ICD-10-CM | POA: Insufficient documentation

## 2014-12-13 DIAGNOSIS — Z8619 Personal history of other infectious and parasitic diseases: Secondary | ICD-10-CM | POA: Diagnosis not present

## 2014-12-13 DIAGNOSIS — R21 Rash and other nonspecific skin eruption: Secondary | ICD-10-CM | POA: Diagnosis present

## 2014-12-13 NOTE — ED Notes (Signed)
Pt states tonight at home she started itching all over and her skin got flushed  Pt states unknown cause  Denies any difficulty breathing or swallowing

## 2014-12-14 ENCOUNTER — Encounter (HOSPITAL_COMMUNITY): Payer: Self-pay | Admitting: Emergency Medicine

## 2014-12-14 MED ORDER — FAMOTIDINE 20 MG PO TABS
40.0000 mg | ORAL_TABLET | Freq: Once | ORAL | Status: AC
  Administered 2014-12-14: 40 mg via ORAL
  Filled 2014-12-14: qty 2

## 2014-12-14 MED ORDER — LORATADINE 10 MG PO TABS
10.0000 mg | ORAL_TABLET | Freq: Once | ORAL | Status: AC
  Administered 2014-12-14: 10 mg via ORAL
  Filled 2014-12-14: qty 1

## 2014-12-14 MED ORDER — FAMOTIDINE 20 MG PO TABS: 20.0000 mg | ORAL_TABLET | Freq: Two times a day (BID) | ORAL | Status: DC

## 2014-12-14 MED ORDER — PREDNISONE 20 MG PO TABS
60.0000 mg | ORAL_TABLET | Freq: Once | ORAL | Status: AC
  Administered 2014-12-14: 60 mg via ORAL
  Filled 2014-12-14: qty 3

## 2014-12-14 MED ORDER — PREDNISONE 20 MG PO TABS: ORAL_TABLET | ORAL | Status: DC

## 2014-12-14 NOTE — Discharge Instructions (Signed)
Allergies  Allergies may happen from anything your body is sensitive to. This may be food, medicines, pollens, chemicals, and many other things. Food allergies can be severe and deadly.  HOME CARE  If you do not know what causes a reaction, keep a diary. Write down the foods you ate and the symptoms that followed. Avoid foods that cause reactions.  If you have red raised spots (hives) or a rash:  Take medicine as told by your doctor.  Use medicines for red raised spots and itching as needed.  Apply cold cloths (compresses) to the skin. Take a cool bath. Avoid hot baths or showers.  If you are severely allergic:  It is often necessary to go to the hospital after you have treated your reaction.  Wear your medical alert jewelry.  You and your family must learn how to give a allergy shot or use an allergy kit (anaphylaxis kit).  Always carry your allergy kit or shot with you. Use this medicine as told by your doctor if a severe reaction is occurring. GET HELP RIGHT AWAY IF:  You have trouble breathing or are making high-pitched whistling sounds (wheezing).  You have a tight feeling in your chest or throat.  You have a puffy (swollen) mouth.  You have red raised spots, puffiness (swelling), or itching all over your body.  You have had a severe reaction that was helped by your allergy kit or shot. The reaction can return once the medicine has worn off.  You think you are having a food allergy. Symptoms most often happen within 30 minutes of eating a food.  Your symptoms have not gone away within 2 days or are getting worse.  You have new symptoms.  You want to retest yourself with a food or drink you think causes an allergic reaction. Only do this under the care of a doctor. MAKE SURE YOU:   Understand these instructions.  Will watch your condition.  Will get help right away if you are not doing well or get worse. Document Released: 03/08/2013 Document Reviewed:  03/08/2013 [facility] Patient Information 2015 [place]. This information is not intended to replace advice given to you by your health care provider. Make sure you discuss any questions you have with your health care provider.

## 2014-12-14 NOTE — ED Provider Notes (Signed)
CSN: [id number]     Arrival date & time 12/13/14  2347 History   First MD Initiated Contact with Patient 12/13/14 2359     Chief Complaint  Patient presents with  . Allergic Reaction     (Consider location/radiation/quality/duration/timing/severity/associated sxs/prior Treatment) Patient is a 34 y.o. female presenting with allergic reaction. The history is provided by the patient. No language interpreter was used.  Allergic Reaction Presenting symptoms: itching   Presenting symptoms: no difficulty breathing, no rash, no swelling and no wheezing   Itching:    Location:  Full body   Severity:  Moderate   Onset quality:  Sudden   Timing:  Constant   Progression:  Unchanged Severity:  Moderate Prior allergic episodes:  Seasonal allergies Context: no animal exposure   Relieved by:  Nothing Worsened by:  Nothing tried Ineffective treatments:  None tried   Past Medical History  Diagnosis Date  . Anemia   . Chlamydia    Past Surgical History  Procedure Laterality Date  . Cesarean section    . Appendectomy    . Tonsillectomy    . Cesarean section      x4  . Tubal ligation     Family History  Problem Relation Age of Onset  . Diabetes Mother   . Hypertension Mother   . CAD Mother    History  Substance Use Topics  . Smoking status: Former Smoker -- 0.50 packs/day    Types: Cigarettes    Quit date: 04/25/2014  . Smokeless tobacco: Never Used  . Alcohol Use: No     Comment: former   OB History    Gravida Para Term Preterm AB TAB SAB Ectopic Multiple Living   5 5 3 2      3      Review of Systems  Respiratory: Negative for wheezing.   Skin: Positive for itching. Negative for rash.  All other systems reviewed and are negative.     Allergies  Augmentin and Penicillins  Home Medications   Prior to Admission medications   Medication Sig Start Date End Date Taking? Authorizing Provider  albuterol (PROVENTIL HFA;VENTOLIN HFA) 108 (90 BASE) MCG/ACT inhaler Inhale  2 puffs into the lungs every 4 (four) hours as needed for wheezing or shortness of breath. 07/25/14  Yes Shon Batonourtney F Horton, MD  FLUoxetine (PROZAC) 20 MG capsule Take 40 mg by mouth at bedtime.    Yes Historical Provider, MD  gabapentin (NEURONTIN) 300 MG capsule Take 300 mg by mouth at bedtime.   Yes Historical Provider, MD  Levonorgestrel-Ethinyl Estradiol (SEASONIQUE) 0.15-0.03 &0.01 MG tablet Take 1 tablet by mouth daily.     Yes Historical Provider, MD  loratadine (CLARITIN) 10 MG tablet Take 1 tablet (10 mg total) by mouth daily. 07/25/14  Yes Shon Batonourtney F Horton, MD  montelukast (SINGULAIR) 10 MG tablet Take 10 mg by mouth at bedtime.   Yes Historical Provider, MD  Olopatadine HCl (PATADAY) 0.2 % SOLN Place 1 drop into both eyes as needed (allergies).  08/19/14  Yes Historical Provider, MD  omeprazole (PRILOSEC) 40 MG capsule Take 40 mg by mouth daily. 08/19/14  Yes Historical Provider, MD  PATANASE 0.6 % SOLN Place 1 spray into both nostrils at bedtime.  08/22/14  Yes Historical Provider, MD  EPINEPHrine (EPIPEN 2-PAK) 0.3 mg/0.3 mL IJ SOAJ injection Inject 1 Dose as directed as needed. 08/19/14   Historical Provider, MD  HYDROcodone-acetaminophen (NORCO/VICODIN) 5-325 MG per tablet Take 2 tablets by mouth every 4 (four) hours as  needed for moderate pain or severe pain. Patient not taking: Reported on 12/14/2014 10/17/14   Emilia Beck, PA-C  ibuprofen (ADVIL,MOTRIN) 600 MG tablet Take 1 tablet (600 mg total) by mouth every 6 (six) hours as needed. Patient not taking: Reported on 12/14/2014 06/07/14   Victorino Dike L Piepenbrink, PA-C  ondansetron (ZOFRAN ODT) 4 MG disintegrating tablet Take 1 tablet (4 mg total) by mouth every 8 (eight) hours as needed for nausea or vomiting. Patient not taking: Reported on 12/14/2014 10/17/14   Emilia Beck, PA-C  predniSONE (DELTASONE) 20 MG tablet Take 3 tablets (60 mg total) by mouth daily with breakfast. Patient not taking: Reported on 10/16/2014 07/25/14    Shon Baton, MD   BP 131/88 mmHg  Pulse 96  Temp(Src) 98.4 F (36.9 C) (Oral)  Resp 18  SpO2 100% Physical Exam  Constitutional: She is oriented to person, place, and time. She appears well-developed and well-nourished. No distress.  HENT:  Head: Normocephalic and atraumatic.  Mouth/Throat: Oropharynx is clear and moist. No oropharyngeal exudate.  Eyes: Conjunctivae are normal. Pupils are equal, round, and reactive to light.  Neck: Normal range of motion. Neck supple.  Cardiovascular: Normal rate, regular rhythm and intact distal pulses.   Pulmonary/Chest: Effort normal and breath sounds normal. No stridor. No respiratory distress. She has no wheezes. She has no rales.  Abdominal: Soft. Bowel sounds are normal. There is no tenderness. There is no rebound and no guarding.  Musculoskeletal: Normal range of motion.  Neurological: She is alert and oriented to person, place, and time.  Skin: Skin is warm and dry. No rash noted. No erythema.  Psychiatric: She has a normal mood and affect.    ED Course  Procedures (including critical care time) Labs Review Labs Reviewed - No data to display  Imaging Review No results found.   EKG Interpretation None      MDM   Final diagnoses:  None    Will start pepcid and steroids.  Return for swelling SOB or any concerns    Aicia K Jeremiah Curci-Rasch, MD 12/14/14 0040

## 2014-12-22 ENCOUNTER — Encounter (HOSPITAL_COMMUNITY): Payer: Self-pay | Admitting: Emergency Medicine

## 2014-12-22 ENCOUNTER — Inpatient Hospital Stay (HOSPITAL_COMMUNITY)
Admission: EM | Admit: 2014-12-22 | Discharge: 2014-12-25 | DRG: 418 | Disposition: A | Discharge status: Home / Self Care | Payer: Medicaid Other | Attending: Internal Medicine | Admitting: Internal Medicine

## 2014-12-22 DIAGNOSIS — F329 Major depressive disorder, single episode, unspecified: Secondary | ICD-10-CM | POA: Diagnosis present

## 2014-12-22 DIAGNOSIS — G5790 Unspecified mononeuropathy of unspecified lower limb: Secondary | ICD-10-CM | POA: Diagnosis present

## 2014-12-22 DIAGNOSIS — K8000 Calculus of gallbladder with acute cholecystitis without obstruction: Secondary | ICD-10-CM | POA: Diagnosis present

## 2014-12-22 DIAGNOSIS — Z6834 Body mass index (BMI) 34.0-34.9, adult: Secondary | ICD-10-CM

## 2014-12-22 DIAGNOSIS — R109 Unspecified abdominal pain: Secondary | ICD-10-CM | POA: Diagnosis present

## 2014-12-22 DIAGNOSIS — K851 Biliary acute pancreatitis: Principal | ICD-10-CM | POA: Diagnosis present

## 2014-12-22 DIAGNOSIS — R52 Pain, unspecified: Secondary | ICD-10-CM

## 2014-12-22 DIAGNOSIS — K8020 Calculus of gallbladder without cholecystitis without obstruction: Secondary | ICD-10-CM

## 2014-12-22 DIAGNOSIS — Z87891 Personal history of nicotine dependence: Secondary | ICD-10-CM

## 2014-12-22 DIAGNOSIS — D72829 Elevated white blood cell count, unspecified: Secondary | ICD-10-CM | POA: Diagnosis present

## 2014-12-22 DIAGNOSIS — K829 Disease of gallbladder, unspecified: Secondary | ICD-10-CM

## 2014-12-22 DIAGNOSIS — E669 Obesity, unspecified: Secondary | ICD-10-CM | POA: Diagnosis present

## 2014-12-22 DIAGNOSIS — K219 Gastro-esophageal reflux disease without esophagitis: Secondary | ICD-10-CM | POA: Diagnosis present

## 2014-12-22 DIAGNOSIS — Z79899 Other long term (current) drug therapy: Secondary | ICD-10-CM

## 2014-12-22 DIAGNOSIS — R1011 Right upper quadrant pain: Secondary | ICD-10-CM

## 2014-12-22 DIAGNOSIS — F32A Depression, unspecified: Secondary | ICD-10-CM | POA: Diagnosis present

## 2014-12-22 DIAGNOSIS — F419 Anxiety disorder, unspecified: Secondary | ICD-10-CM | POA: Diagnosis present

## 2014-12-22 LAB — BASIC METABOLIC PANEL
Anion gap: 10 (ref 5–15)
BUN: 10 mg/dL (ref 6–23)
CO2: 22 mmol/L (ref 19–32)
CREATININE: 0.69 mg/dL (ref 0.50–1.10)
Calcium: 9.2 mg/dL (ref 8.4–10.5)
Chloride: 107 mmol/L (ref 96–112)
Glucose, Bld: 120 mg/dL — ABNORMAL HIGH (ref 70–99)
Potassium: 4.1 mmol/L (ref 3.5–5.1)
Sodium: 139 mmol/L (ref 135–145)

## 2014-12-22 LAB — POC URINE PREG, ED: Preg Test, Ur: NEGATIVE

## 2014-12-22 MED ORDER — ONDANSETRON HCL 4 MG/2ML IJ SOLN
4.0000 mg | Freq: Once | INTRAMUSCULAR | Status: AC
  Administered 2014-12-22: 4 mg via INTRAVENOUS
  Filled 2014-12-22: qty 2

## 2014-12-22 MED ORDER — MORPHINE SULFATE 4 MG/ML IJ SOLN
4.0000 mg | Freq: Once | INTRAMUSCULAR | Status: AC
  Administered 2014-12-22: 4 mg via INTRAVENOUS
  Filled 2014-12-22: qty 1

## 2014-12-22 NOTE — ED Notes (Signed)
Pt states she is having upper abdomen pain that started about 5pm   Pt states the pain was worse after eating and the pain radiates into her back   Pt states she has felt nauseated for about the past 4 to 5 days and has had some vomiting and diarrhea over the past few days as well

## 2014-12-22 NOTE — ED Provider Notes (Signed)
CSN: [id number]     Arrival date & time 12/22/14  2243 History   First MD Initiated Contact with Patient 12/22/14 2311     Chief Complaint  Patient presents with  . Abdominal Pain     (Consider location/radiation/quality/duration/timing/severity/associated sxs/prior Treatment) HPI Comments: Patient presents today with a chief complaint of RUQ abdominal pain.  She reports that the pain has been intermittent over the past 4 days, but has been constant since 5:00 PM today.  She reports that the pain feels like "contractions."  She states that the pain radiates to her back.  Pain worse after eating.  She has not taken anything for her pain prior to arrival.  She reports associated nausea, vomiting, and diarrhea over the past 4 days.  She denies fever, chills, urinary symptoms, vaginal discharge, melena, hematochezia, chest pain, or SOB.  She states that she thinks that she may have had Gallstones in the past.    Patient is a 34 y.o. female presenting with abdominal pain. The history is provided by the patient.  Abdominal Pain   Past Medical History  Diagnosis Date  . Anemia   . Chlamydia    Past Surgical History  Procedure Laterality Date  . Cesarean section    . Appendectomy    . Tonsillectomy    . Cesarean section      x4  . Tubal ligation     Family History  Problem Relation Age of Onset  . Diabetes Mother   . Hypertension Mother   . CAD Mother    History  Substance Use Topics  . Smoking status: Former Smoker -- 0.50 packs/day    Types: Cigarettes    Quit date: 04/25/2014  . Smokeless tobacco: Never Used  . Alcohol Use: No     Comment: former   OB History    Gravida Para Term Preterm AB TAB SAB Ectopic Multiple Living   5 5 3 2      3      Review of Systems  Gastrointestinal: Positive for abdominal pain.  All other systems reviewed and are negative.     Allergies  Augmentin; Percocet; and Penicillins  Home Medications   Prior to Admission medications    Medication Sig Start Date End Date Taking? Authorizing Provider  albuterol (PROVENTIL HFA;VENTOLIN HFA) 108 (90 BASE) MCG/ACT inhaler Inhale 2 puffs into the lungs every 4 (four) hours as needed for wheezing or shortness of breath. 07/25/14  Yes Shon Batonourtney F Horton, MD  EPINEPHrine (EPIPEN 2-PAK) 0.3 mg/0.3 mL IJ SOAJ injection Inject 1 Dose as directed as needed. 08/19/14  Yes Historical Provider, MD  FLUoxetine (PROZAC) 20 MG capsule Take 40 mg by mouth at bedtime.    Yes Historical Provider, MD  gabapentin (NEURONTIN) 300 MG capsule Take 300 mg by mouth at bedtime.   Yes Historical Provider, MD  Levonorgestrel-Ethinyl Estradiol (SEASONIQUE) 0.15-0.03 &0.01 MG tablet Take 1 tablet by mouth daily.     Yes Historical Provider, MD  loratadine (CLARITIN) 10 MG tablet Take 1 tablet (10 mg total) by mouth daily. 07/25/14  Yes Shon Batonourtney F Horton, MD  montelukast (SINGULAIR) 10 MG tablet Take 10 mg by mouth at bedtime.   Yes Historical Provider, MD  Olopatadine HCl (PATADAY) 0.2 % SOLN Place 1 drop into both eyes as needed (allergies).  08/19/14  Yes Historical Provider, MD  omeprazole (PRILOSEC) 40 MG capsule Take 40 mg by mouth daily. 08/19/14  Yes Historical Provider, MD  PATANASE 0.6 % SOLN Place 1 spray into  both nostrils at bedtime.  08/22/14  Yes Historical Provider, MD  famotidine (PEPCID) 20 MG tablet Take 1 tablet (20 mg total) by mouth 2 (two) times daily. Patient not taking: Reported on 12/22/2014 12/14/14   Rylan K Palumbo-Rasch, MD  HYDROcodone-acetaminophen (NORCO/VICODIN) 5-325 MG per tablet Take 2 tablets by mouth every 4 (four) hours as needed for moderate pain or severe pain. Patient not taking: Reported on 12/14/2014 10/17/14   Emilia Beck, PA-C  ibuprofen (ADVIL,MOTRIN) 600 MG tablet Take 1 tablet (600 mg total) by mouth every 6 (six) hours as needed. Patient not taking: Reported on 12/14/2014 06/07/14   Victorino Dike L Piepenbrink, PA-C  ondansetron (ZOFRAN ODT) 4 MG disintegrating tablet Take  1 tablet (4 mg total) by mouth every 8 (eight) hours as needed for nausea or vomiting. Patient not taking: Reported on 12/14/2014 10/17/14   Emilia Beck, PA-C  predniSONE (DELTASONE) 20 MG tablet Take 3 tablets (60 mg total) by mouth daily with breakfast. Patient not taking: Reported on 10/16/2014 07/25/14   Shon Baton, MD  predniSONE (DELTASONE) 20 MG tablet 3 tabs po day one, then 2 po daily x 4 days Patient not taking: Reported on 12/22/2014 12/14/14   Sheyenne K Palumbo-Rasch, MD   BP 141/100 mmHg  Pulse 114  Temp(Src) 98.7 F (37.1 C) (Oral)  Resp 22  SpO2 100% Physical Exam  Constitutional: She appears well-developed and well-nourished.  Patient uncomfortable appearing  HENT:  Head: Normocephalic and atraumatic.  Mouth/Throat: Oropharynx is clear and moist.  Neck: Normal range of motion. Neck supple.  Cardiovascular: Normal rate, regular rhythm and normal heart sounds.   Pulmonary/Chest: Effort normal and breath sounds normal.  Abdominal: Soft. Bowel sounds are normal. There is generalized tenderness. There is positive Murphy's sign. There is no rigidity, no rebound, no guarding and no tenderness at McBurney's point.  Obese abdomen Diffuse abdominal tenderness to palpation, worse in the RUQ  Musculoskeletal: Normal range of motion.  Neurological: She is alert.  Skin: Skin is warm and dry.  Psychiatric: She has a normal mood and affect.  Nursing note and vitals reviewed.   ED Course  Procedures (including critical care time) Labs Review Labs Reviewed  CBC WITH DIFFERENTIAL/PLATELET - Abnormal; Notable for the following:    WBC 11.9 (*)    RBC 6.26 (*)    MCV 62.0 (*)    MCH 19.5 (*)    RDW 15.7 (*)    All other components within normal limits  BASIC METABOLIC PANEL  URINALYSIS, ROUTINE W REFLEX MICROSCOPIC  LIPASE, BLOOD  HEPATIC FUNCTION PANEL  POC URINE PREG, ED    Imaging Review No results found.   EKG Interpretation None     1:00 AM Patient  signed out to Elpidio Anis, PA-C at shift change.  RUQ ultrasound pending to rule out Cholecystitis.   MDM   Final diagnoses:  RUQ abdominal pain   Patient presents today with a chief complaint of abdominal pain, which is worse in the RUQ.  RUQ very tender to palpation on exam.  Pain worse after eating.  Patient is afebrile.  No rebound or guarding on exam.  Labs show mild leukocytosis and mildly elevated Lipase, but otherwise unremarkable. Normal LFT's.  Abdominal ultrasound ordered to rule out Cholecystitis.  Elpidio Anis, PA-C will follow up on the results.      Santiago Glad, PA-C 12/25/14 1610  Derwood Kaplan, MD 12/26/14 [phone]

## 2014-12-23 ENCOUNTER — Emergency Department (HOSPITAL_COMMUNITY): Payer: Medicaid Other

## 2014-12-23 ENCOUNTER — Encounter (HOSPITAL_COMMUNITY): Payer: Self-pay

## 2014-12-23 DIAGNOSIS — R109 Unspecified abdominal pain: Secondary | ICD-10-CM | POA: Diagnosis present

## 2014-12-23 DIAGNOSIS — F329 Major depressive disorder, single episode, unspecified: Secondary | ICD-10-CM | POA: Diagnosis present

## 2014-12-23 DIAGNOSIS — Z79899 Other long term (current) drug therapy: Secondary | ICD-10-CM | POA: Diagnosis not present

## 2014-12-23 DIAGNOSIS — K851 Biliary acute pancreatitis: Secondary | ICD-10-CM | POA: Diagnosis present

## 2014-12-23 DIAGNOSIS — Z6834 Body mass index (BMI) 34.0-34.9, adult: Secondary | ICD-10-CM | POA: Diagnosis not present

## 2014-12-23 DIAGNOSIS — E669 Obesity, unspecified: Secondary | ICD-10-CM | POA: Diagnosis present

## 2014-12-23 DIAGNOSIS — R1011 Right upper quadrant pain: Secondary | ICD-10-CM

## 2014-12-23 DIAGNOSIS — K8000 Calculus of gallbladder with acute cholecystitis without obstruction: Secondary | ICD-10-CM | POA: Diagnosis present

## 2014-12-23 DIAGNOSIS — K219 Gastro-esophageal reflux disease without esophagitis: Secondary | ICD-10-CM | POA: Diagnosis present

## 2014-12-23 DIAGNOSIS — G5790 Unspecified mononeuropathy of unspecified lower limb: Secondary | ICD-10-CM | POA: Diagnosis present

## 2014-12-23 DIAGNOSIS — K8070 Calculus of gallbladder and bile duct without cholecystitis without obstruction: Secondary | ICD-10-CM

## 2014-12-23 DIAGNOSIS — Z87891 Personal history of nicotine dependence: Secondary | ICD-10-CM | POA: Diagnosis not present

## 2014-12-23 DIAGNOSIS — F419 Anxiety disorder, unspecified: Secondary | ICD-10-CM | POA: Diagnosis present

## 2014-12-23 LAB — URINALYSIS, ROUTINE W REFLEX MICROSCOPIC
BILIRUBIN URINE: NEGATIVE
GLUCOSE, UA: NEGATIVE mg/dL
Hgb urine dipstick: NEGATIVE
KETONES UR: NEGATIVE mg/dL
Nitrite: NEGATIVE
PH: 6 (ref 5.0–8.0)
PROTEIN: NEGATIVE mg/dL
Specific Gravity, Urine: 1.021 (ref 1.005–1.030)
UROBILINOGEN UA: 1 mg/dL (ref 0.0–1.0)

## 2014-12-23 LAB — COMPREHENSIVE METABOLIC PANEL
ALK PHOS: 58 U/L (ref 39–117)
ALT: 13 U/L (ref 0–35)
AST: 18 U/L (ref 0–37)
Albumin: 3.8 g/dL (ref 3.5–5.2)
Anion gap: 9 (ref 5–15)
BILIRUBIN TOTAL: 0.6 mg/dL (ref 0.3–1.2)
BUN: 10 mg/dL (ref 6–23)
CHLORIDE: 105 mmol/L (ref 96–112)
CO2: 24 mmol/L (ref 19–32)
CREATININE: 0.6 mg/dL (ref 0.50–1.10)
Calcium: 8.6 mg/dL (ref 8.4–10.5)
GFR calc Af Amer: >90 mL/min (ref >90)
GFR calc non Af Amer: >90 mL/min (ref >90)
Glucose, Bld: 99 mg/dL (ref 70–99)
POTASSIUM: 4.4 mmol/L (ref 3.5–5.1)
Sodium: 138 mmol/L (ref 135–145)
Total Protein: 7.2 g/dL (ref 6.0–8.3)

## 2014-12-23 LAB — CBC WITH DIFFERENTIAL/PLATELET
Basophils Absolute: 0 10*3/uL (ref 0.0–0.1)
Basophils Absolute: 0 10*3/uL (ref 0.0–0.1)
Basophils Relative: 0 % (ref 0–1)
Basophils Relative: 0 % (ref 0–1)
EOS ABS: 0.5 10*3/uL (ref 0.0–0.7)
EOS ABS: 0.1 10*3/uL (ref 0.0–0.7)
Eosinophils Relative: 4 % (ref 0–5)
Eosinophils Relative: 1 % (ref 0–5)
HCT: 38.8 % (ref 36.0–46.0)
HCT: 35 % — ABNORMAL LOW (ref 36.0–46.0)
Hemoglobin: 12.2 g/dL (ref 12.0–15.0)
Hemoglobin: 10.8 g/dL — ABNORMAL LOW (ref 12.0–15.0)
Lymphocytes Relative: 41 % (ref 12–46)
Lymphocytes Relative: 27 % (ref 12–46)
Lymphs Abs: 4.9 10*3/uL — ABNORMAL HIGH (ref 0.7–4.0)
Lymphs Abs: 2.8 10*3/uL (ref 0.7–4.0)
MCH: 19.5 pg — AB (ref 26.0–34.0)
MCH: 19.5 pg — AB (ref 26.0–34.0)
MCHC: 31.4 g/dL (ref 30.0–36.0)
MCHC: 30.9 g/dL (ref 30.0–36.0)
MCV: 62 fL — ABNORMAL LOW (ref 78.0–100.0)
MCV: 63.1 fL — ABNORMAL LOW (ref 78.0–100.0)
MONOS PCT: 5 % (ref 3–12)
MONOS PCT: 4 % (ref 3–12)
Monocytes Absolute: 0.6 10*3/uL (ref 0.1–1.0)
Monocytes Absolute: 0.4 10*3/uL (ref 0.1–1.0)
NEUTROS ABS: 5.9 10*3/uL (ref 1.7–7.7)
NEUTROS ABS: 7.1 10*3/uL (ref 1.7–7.7)
Neutrophils Relative %: 50 % (ref 43–77)
Neutrophils Relative %: 68 % (ref 43–77)
Platelets: 309 10*3/uL (ref 150–400)
Platelets: 282 10*3/uL (ref 150–400)
RBC: 6.26 MIL/uL — ABNORMAL HIGH (ref 3.87–5.11)
RBC: 5.55 MIL/uL — ABNORMAL HIGH (ref 3.87–5.11)
RDW: 15.7 % — AB (ref 11.5–15.5)
RDW: 15.9 % — AB (ref 11.5–15.5)
WBC: 11.9 10*3/uL — AB (ref 4.0–10.5)
WBC: 10.4 10*3/uL (ref 4.0–10.5)

## 2014-12-23 LAB — HEPATIC FUNCTION PANEL
ALK PHOS: 64 U/L (ref 39–117)
ALT: 13 U/L (ref 0–35)
AST: 23 U/L (ref 0–37)
Albumin: 4 g/dL (ref 3.5–5.2)
BILIRUBIN DIRECT: 0.1 mg/dL (ref 0.0–0.5)
BILIRUBIN TOTAL: 0.5 mg/dL (ref 0.3–1.2)
Indirect Bilirubin: 0.4 mg/dL (ref 0.3–0.9)
Total Protein: 7.4 g/dL (ref 6.0–8.3)

## 2014-12-23 LAB — LIPASE, BLOOD
LIPASE: 186 U/L — AB (ref 11–59)
LIPASE: 73 U/L — AB (ref 11–59)

## 2014-12-23 LAB — URINE MICROSCOPIC-ADD ON

## 2014-12-23 MED ORDER — HYDROMORPHONE HCL 1 MG/ML IJ SOLN
0.5000 mg | Freq: Once | INTRAMUSCULAR | Status: AC
  Administered 2014-12-23: 0.5 mg via INTRAVENOUS
  Filled 2014-12-23: qty 1

## 2014-12-23 MED ORDER — PANTOPRAZOLE SODIUM 40 MG PO TBEC
40.0000 mg | DELAYED_RELEASE_TABLET | Freq: Every day | ORAL | Status: DC
  Administered 2014-12-23 – 2014-12-25 (×3): 40 mg via ORAL
  Filled 2014-12-23 (×3): qty 1

## 2014-12-23 MED ORDER — ONDANSETRON HCL 4 MG/2ML IJ SOLN: 4.0000 mg | Freq: Three times a day (TID) | INTRAMUSCULAR | Status: DC | PRN

## 2014-12-23 MED ORDER — ENOXAPARIN SODIUM 40 MG/0.4ML ~~LOC~~ SOLN
40.0000 mg | SUBCUTANEOUS | Status: DC
  Administered 2014-12-23: 40 mg via SUBCUTANEOUS
  Filled 2014-12-23 (×2): qty 0.4

## 2014-12-23 MED ORDER — FLUTICASONE PROPIONATE 50 MCG/ACT NA SUSP
1.0000 | Freq: Every day | NASAL | Status: DC
  Filled 2014-12-23: qty 16

## 2014-12-23 MED ORDER — ONDANSETRON HCL 4 MG PO TABS: 4.0000 mg | ORAL_TABLET | Freq: Four times a day (QID) | ORAL | Status: DC | PRN

## 2014-12-23 MED ORDER — KETOROLAC TROMETHAMINE 30 MG/ML IJ SOLN
30.0000 mg | Freq: Four times a day (QID) | INTRAMUSCULAR | Status: DC | PRN
  Administered 2014-12-23 – 2014-12-24 (×3): 30 mg via INTRAVENOUS
  Filled 2014-12-23 (×3): qty 1

## 2014-12-23 MED ORDER — HYDROMORPHONE HCL 1 MG/ML IJ SOLN
1.0000 mg | Freq: Once | INTRAMUSCULAR | Status: AC
  Administered 2014-12-23: 1 mg via INTRAVENOUS
  Filled 2014-12-23: qty 1

## 2014-12-23 MED ORDER — LIP MEDEX EX OINT
TOPICAL_OINTMENT | CUTANEOUS | Status: AC
  Filled 2014-12-23: qty 7

## 2014-12-23 MED ORDER — MONTELUKAST SODIUM 10 MG PO TABS
10.0000 mg | ORAL_TABLET | Freq: Every day | ORAL | Status: DC
  Administered 2014-12-23 – 2014-12-24 (×2): 10 mg via ORAL
  Filled 2014-12-23 (×3): qty 1

## 2014-12-23 MED ORDER — OLOPATADINE HCL 0.1 % OP SOLN
1.0000 [drp] | Freq: Two times a day (BID) | OPHTHALMIC | Status: DC
  Administered 2014-12-23 – 2014-12-25 (×5): 1 [drp] via OPHTHALMIC
  Filled 2014-12-23: qty 5

## 2014-12-23 MED ORDER — POTASSIUM CHLORIDE IN NACL 20-0.9 MEQ/L-% IV SOLN
INTRAVENOUS | Status: DC
  Administered 2014-12-23: 14:00:00 via INTRAVENOUS
  Filled 2014-12-23 (×7): qty 1000

## 2014-12-23 MED ORDER — LORATADINE 10 MG PO TABS
10.0000 mg | ORAL_TABLET | Freq: Every day | ORAL | Status: DC
  Administered 2014-12-23 – 2014-12-25 (×3): 10 mg via ORAL
  Filled 2014-12-23 (×3): qty 1

## 2014-12-23 MED ORDER — GABAPENTIN 300 MG PO CAPS
300.0000 mg | ORAL_CAPSULE | Freq: Every day | ORAL | Status: DC
  Administered 2014-12-23 – 2014-12-24 (×2): 300 mg via ORAL
  Filled 2014-12-23 (×3): qty 1

## 2014-12-23 MED ORDER — HYDROMORPHONE HCL 2 MG/ML IJ SOLN
2.0000 mg | INTRAMUSCULAR | Status: AC | PRN
  Administered 2014-12-23: 2 mg via INTRAVENOUS
  Filled 2014-12-23: qty 1

## 2014-12-23 MED ORDER — IOHEXOL 300 MG/ML  SOLN
100.0000 mL | Freq: Once | INTRAMUSCULAR | Status: AC | PRN
  Administered 2014-12-23: 100 mL via INTRAVENOUS

## 2014-12-23 MED ORDER — ONDANSETRON HCL 4 MG/2ML IJ SOLN
4.0000 mg | Freq: Four times a day (QID) | INTRAMUSCULAR | Status: DC | PRN
  Administered 2014-12-23: 4 mg via INTRAVENOUS
  Filled 2014-12-23: qty 2

## 2014-12-23 MED ORDER — CIPROFLOXACIN IN D5W 400 MG/200ML IV SOLN
400.0000 mg | Freq: Once | INTRAVENOUS | Status: AC
  Administered 2014-12-23: 400 mg via INTRAVENOUS
  Filled 2014-12-23: qty 200

## 2014-12-23 MED ORDER — HYDROMORPHONE HCL 2 MG/ML IJ SOLN: 2.0000 mg | INTRAMUSCULAR | Status: DC | PRN

## 2014-12-23 MED ORDER — HYDROMORPHONE HCL 1 MG/ML IJ SOLN: 1.0000 mg | INTRAMUSCULAR | Status: DC | PRN

## 2014-12-23 MED ORDER — LEVONORGEST-ETH ESTRAD 91-DAY 0.15-0.03 &0.01 MG PO TABS
1.0000 | ORAL_TABLET | Freq: Every day | ORAL | Status: DC
  Administered 2014-12-24: 1 via ORAL

## 2014-12-23 MED ORDER — DEXTROSE 5 % IV SOLN
1.0000 g | Freq: Two times a day (BID) | INTRAVENOUS | Status: DC
  Administered 2014-12-23 – 2014-12-24 (×3): 1 g via INTRAVENOUS
  Filled 2014-12-23 (×4): qty 1

## 2014-12-23 MED ORDER — PROMETHAZINE HCL 25 MG/ML IJ SOLN
25.0000 mg | Freq: Once | INTRAMUSCULAR | Status: AC
  Administered 2014-12-23: 25 mg via INTRAVENOUS
  Filled 2014-12-23: qty 1

## 2014-12-23 MED ORDER — ONDANSETRON HCL 4 MG/2ML IJ SOLN
4.0000 mg | Freq: Once | INTRAMUSCULAR | Status: AC
  Administered 2014-12-23: 4 mg via INTRAVENOUS
  Filled 2014-12-23: qty 2

## 2014-12-23 MED ORDER — DIPHENHYDRAMINE HCL 50 MG/ML IJ SOLN
25.0000 mg | Freq: Four times a day (QID) | INTRAMUSCULAR | Status: DC | PRN
  Administered 2014-12-23 – 2014-12-24 (×3): 25 mg via INTRAVENOUS
  Filled 2014-12-23 (×3): qty 1

## 2014-12-23 MED ORDER — FLUOXETINE HCL 20 MG PO CAPS
40.0000 mg | ORAL_CAPSULE | Freq: Every day | ORAL | Status: DC
  Administered 2014-12-23 – 2014-12-24 (×2): 40 mg via ORAL
  Filled 2014-12-23 (×3): qty 2

## 2014-12-23 MED ORDER — MICONAZOLE NITRATE 2 % EX CREA
1.0000 "application " | TOPICAL_CREAM | Freq: Two times a day (BID) | CUTANEOUS | Status: DC
  Administered 2014-12-23 – 2014-12-25 (×4): 1 via TOPICAL
  Filled 2014-12-23: qty 14

## 2014-12-23 MED ORDER — OLOPATADINE HCL 0.6 % NA SOLN: 1.0000 [drp] | Freq: Every day | NASAL | Status: DC

## 2014-12-23 NOTE — Progress Notes (Signed)
CARE MANAGEMENT NOTE 12/23/2014  Patient:  Crystal Myers,Crystal Myers   Account Number:  [id number]  Date Initiated:  12/23/2014  Documentation initiated by:  DAVIS,RHONDA  Subjective/Objective Assessment:   ABD [pain s/nausea and vomiting positive ct scan for pancreatitis due to gall stones     Action/Plan:   tbd may require surgical treatment vs medical   Anticipated DC Date:  12/26/2014   Anticipated DC Plan:  HOME/SELF CARE  In-house referral  NA      DC Planning Services  CM consult      PAC Choice  NA   Choice offered to / List presented to:        DME agency  NA        [facility]  NA   Status of service:  In process, will continue to follow Medicare Important Message given?   (If response is "NO", the following Medicare IM given date fields will be blank) Date Medicare IM given:   Medicare IM given by:   Date Additional Medicare IM given:   Additional Medicare IM given by:    Discharge Disposition:    Per UR Regulation:  Reviewed for med. necessity/level of care/duration of stay  If discussed at Long Length of Stay Meetings, dates discussed:    Comments:  Jan. 29, 16/Rhonda L. Earlene Plateravis, RN, BSN, CCM/Case Management [employer] [phone] No discharge needs present of time of review.

## 2014-12-23 NOTE — ED Provider Notes (Signed)
RUQ pain, worse with eating X 2 days, some N, V US pending Labs with mild leukocytosis, lipase 72, normal LFT's No fever  US shows tiny gallstones without evidence cholecystitis. Re-eval: patient's pain seems out of proportion to findings. Pain medication not relieving her pain, continues to be nauseated, no vomiting. Consider other abdominal source of pain vs ovarian torsion.   Dr. Rhunette Myers has seen the patient. Discussed with Dr. Ezzard Myers (surgery) who advises further work up would be required prior to surgery. Agrees with CT scan abdomen and, if negative for other surgical source, admit to medicine for pain management and further GI evaluation - HIDA, etc.   5:00 - CT negative. Pelvic US pending. Pain continues to be difficult to control. She remains tachycardic.  Patient care transferred to Huntley DecAbi Myers at end of shift pending US to r/o torsion. Surgery to be reconsulted if negative. Plan: admit for pain control, further evaluation for likely symptomatic cholecystitis.    Crystal HookerShari A Daralyn Bert, PA-C 12/28/14 2352  Derwood KaplanAnkit Nanavati, MD 01/03/15 1105

## 2014-12-23 NOTE — ED Notes (Signed)
Bed: WA10 Expected date:  Expected time:  Means of arrival:  Comments: 

## 2014-12-23 NOTE — Consult Note (Signed)
Reason for Consult:  RUQ pain  - uncontrolled Referring Physician: Dr. Dollene Cleveland  Crystal Myers is an 34 y.o. female.  HPI: 34 y/o with multiple ED visits;  presents with abdominal pain that got very severe about 5 PM last evening.  She has had some pain on and off for about 5 days, some diarrhea, and thought it was GI flu like problem.  She say pain is worse after eating, pain goes to her back; she has had some nausea, some vomiting and diarrhea over the preceding 4-5 day.   Last evening her pain became very severe and here in the ED nothing seems to be making it better.  Currently she presents with almost intractable RUQ pain.  Work up shows she is afebrile, VSS, lipase is 73, LFT's are normal.  Wbc 11.9.  Abdominal US showed: Cholelithiasis with tiny stones and sludge demonstrated in the gallbladder. No additional signs of cholecystitis.  CT scan was obtained and showed:Gallbladder within normal limits. No biliary dilatation. The spleen and adrenal glands are normal. The pancreas demonstrates a normal CT appearance. No significant peripancreatic inflammatory stranding identified. There are no peripancreatic fluid collections. No evidence for acute pancreatitis at this time. No evidence for bowel obstruction. Appendix not visualized, compatible with history of prior appendectomy. No abnormal wall thickening, mucosal enhancement, or inflammatory changes seen about the bowels. Pelvic US: Left ovarian cyst. No acute abnormality is noted. We have repeated the labs and now lipase is up to 186.  This does not correspond to her degree of pain.  WBC is actually better.  We cannot get a HIDA scan at this point because of the amount of narcotics she is requiring.   We are ask to see.   Past Medical History  Diagnosis Date  Anemia   Tobacco use   Medical allergies has an epipen    chronic pain on Neurontin for lower leg pain at night   Asthma  Like symptoms, better since she quit smoking in Jan 2016   Hx  of anxiety  -  Treated for this   Chlamydia    BMI 34.4        Past Surgical History  Procedure Laterality Date  . Appendectomy    . Tonsillectomy    . Cesarean section      x4  . Tubal ligation      Family History  Problem Relation Age of Onset  . Diabetes Mother   . Hypertension Mother   . CAD Mother     Social History:  reports that she quit smoking about 7 months ago. Her smoking use included Cigarettes. She smoked 0.50 packs per day. She has never used smokeless tobacco. She reports that she does not drink alcohol or use illicit drugs. She is single with 4 children, ages 56, 39, 33, 19, working on getting her GED Tobacco 22 years, quit in Jan 2016  - 1/2-1PPD  Allergies:  Allergies  Allergen Reactions  . Augmentin [Amoxicillin-Pot Clavulanate] Itching  . Percocet [Oxycodone-Acetaminophen] Itching  . Penicillins Rash    Prior to Admission medications   Medication Sig Start Date End Date Taking? Authorizing Provider  albuterol (PROVENTIL HFA;VENTOLIN HFA) 108 (90 BASE) MCG/ACT inhaler Inhale 2 puffs into the lungs every 4 (four) hours as needed for wheezing or shortness of breath. 07/25/14  Yes Merryl Hacker, MD  EPINEPHrine (EPIPEN 2-PAK) 0.3 mg/0.3 mL IJ SOAJ injection Inject 1 Dose as directed as needed. 08/19/14  Yes Historical Provider, MD  FLUoxetine (PROZAC) 20 MG capsule Take 40 mg by mouth at bedtime.    Yes Historical Provider, MD  gabapentin (NEURONTIN) 300 MG capsule Take 300 mg by mouth at bedtime.   Yes Historical Provider, MD  Levonorgestrel-Ethinyl Estradiol (SEASONIQUE) 0.15-0.03 &0.01 MG tablet Take 1 tablet by mouth daily.     Yes Historical Provider, MD  loratadine (CLARITIN) 10 MG tablet Take 1 tablet (10 mg total) by mouth daily. 07/25/14  Yes Merryl Hacker, MD  montelukast (SINGULAIR) 10 MG tablet Take 10 mg by mouth at bedtime.   Yes Historical Provider, MD  Olopatadine HCl (PATADAY) 0.2 % SOLN Place 1 drop into both eyes as needed (allergies).   08/19/14  Yes Historical Provider, MD  omeprazole (PRILOSEC) 40 MG capsule Take 40 mg by mouth daily. 08/19/14  Yes Historical Provider, MD  PATANASE 0.6 % SOLN Place 1 spray into both nostrils at bedtime.  08/22/14  Yes Historical Provider, MD  famotidine (PEPCID) 20 MG tablet Take 1 tablet (20 mg total) by mouth 2 (two) times daily. Patient not taking: Reported on 12/22/2014 12/14/14   Malon K Palumbo-Rasch, MD  HYDROcodone-acetaminophen (NORCO/VICODIN) 5-325 MG per tablet Take 2 tablets by mouth every 4 (four) hours as needed for moderate pain or severe pain. Patient not taking: Reported on 12/14/2014 10/17/14   Alvina Chou, PA-C  ibuprofen (ADVIL,MOTRIN) 600 MG tablet Take 1 tablet (600 mg total) by mouth every 6 (six) hours as needed. Patient not taking: Reported on 12/14/2014 06/07/14   Anderson Malta L Piepenbrink, PA-C  ondansetron (ZOFRAN ODT) 4 MG disintegrating tablet Take 1 tablet (4 mg total) by mouth every 8 (eight) hours as needed for nausea or vomiting. Patient not taking: Reported on 12/14/2014 10/17/14   Alvina Chou, PA-C  predniSONE (DELTASONE) 20 MG tablet Take 3 tablets (60 mg total) by mouth daily with breakfast. Patient not taking: Reported on 10/16/2014 07/25/14   Merryl Hacker, MD  predniSONE (DELTASONE) 20 MG tablet 3 tabs po day one, then 2 po daily x 4 days Patient not taking: Reported on 12/22/2014 12/14/14   Karsyn K Palumbo-Rasch, MD   :  Scheduled: . cefoTEtan (CEFOTAN) IV  1 g Intravenous Q12H   Continuous: . ciprofloxacin     ZOX:[id number] (DILAUDID) injection, ondansetron (ZOFRAN) IV Anti-infectives    Start     Dose/Rate Route Frequency Ordered Stop   12/23/14 1000  cefoTEtan (CEFOTAN) 1 g in dextrose 5 % 50 mL IVPB     1 g100 mL/hr over 30 Minutes Intravenous Every 12 hours 12/23/14 0758     12/23/14 1000  ciprofloxacin (CIPRO) IVPB 400 mg     400 mg200 mL/hr over 60 Minutes Intravenous  Once 12/23/14 0947        Results for orders placed or  performed during the hospital encounter of 12/22/14 (from the past 48 hour(s))  CBC with Differential     Status: Abnormal   Collection Time: 12/22/14 11:06 PM  Result Value Ref Range   WBC 11.9 (H) 4.0 - 10.5 K/uL   RBC 6.26 (H) 3.87 - 5.11 MIL/uL   Hemoglobin 12.2 12.0 - 15.0 g/dL   HCT 38.8 36.0 - 46.0 %   MCV 62.0 (L) 78.0 - 100.0 fL   MCH 19.5 (L) 26.0 - 34.0 pg   MCHC 31.4 30.0 - 36.0 g/dL   RDW 15.7 (H) 11.5 - 15.5 %   Platelets 309 150 - 400 K/uL   Neutrophils Relative % 50 43 - 77 %  Lymphocytes Relative 41 12 - 46 %   Monocytes Relative 5 3 - 12 %   Eosinophils Relative 4 0 - 5 %   Basophils Relative 0 0 - 1 %   Neutro Abs 5.9 1.7 - 7.7 K/uL   Lymphs Abs 4.9 (H) 0.7 - 4.0 K/uL   Monocytes Absolute 0.6 0.1 - 1.0 K/uL   Eosinophils Absolute 0.5 0.0 - 0.7 K/uL   Basophils Absolute 0.0 0.0 - 0.1 K/uL   RBC Morphology POLYCHROMASIA PRESENT     Comment: TARGET CELLS TEARDROP CELLS BASOPHILIC STIPPLING ELLIPTOCYTES    Smear Review LARGE PLATELETS PRESENT   Basic metabolic panel     Status: Abnormal   Collection Time: 12/22/14 11:06 PM  Result Value Ref Range   Sodium 139 135 - 145 mmol/L   Potassium 4.1 3.5 - 5.1 mmol/L   Chloride 107 96 - 112 mmol/L   CO2 22 19 - 32 mmol/L   Glucose, Bld 120 (H) 70 - 99 mg/dL   BUN 10 6 - 23 mg/dL   Creatinine, Ser 0.69 0.50 - 1.10 mg/dL   Calcium 9.2 8.4 - 10.5 mg/dL   GFR calc non Af Amer >90 >90 mL/min   GFR calc Af Amer >90 >90 mL/min    Comment: (NOTE) The eGFR has been calculated using the CKD EPI equation. This calculation has not been validated in all clinical situations. eGFR's persistently <90 mL/min signify possible Chronic Kidney Disease.    Anion gap 10 5 - 15  Lipase, blood     Status: Abnormal   Collection Time: 12/22/14 11:31 PM  Result Value Ref Range   Lipase 73 (H) 11 - 59 U/L  Hepatic function panel     Status: None   Collection Time: 12/22/14 11:31 PM  Result Value Ref Range   Total Protein 7.4 6.0 -  8.3 g/dL   Albumin 4.0 3.5 - 5.2 g/dL   AST 23 0 - 37 U/L   ALT 13 0 - 35 U/L   Alkaline Phosphatase 64 39 - 117 U/L   Total Bilirubin 0.5 0.3 - 1.2 mg/dL   Bilirubin, Direct 0.1 0.0 - 0.5 mg/dL    Comment: Please note change in reference range.   Indirect Bilirubin 0.4 0.3 - 0.9 mg/dL  Urinalysis, Routine w reflex microscopic     Status: Abnormal   Collection Time: 12/22/14 11:49 PM  Result Value Ref Range   Color, Urine YELLOW YELLOW   APPearance CLOUDY (A) CLEAR   Specific Gravity, Urine 1.021 1.005 - 1.030   pH 6.0 5.0 - 8.0   Glucose, UA NEGATIVE NEGATIVE mg/dL   Hgb urine dipstick NEGATIVE NEGATIVE   Bilirubin Urine NEGATIVE NEGATIVE   Ketones, ur NEGATIVE NEGATIVE mg/dL   Protein, ur NEGATIVE NEGATIVE mg/dL   Urobilinogen, UA 1.0 0.0 - 1.0 mg/dL   Nitrite NEGATIVE NEGATIVE   Leukocytes, UA LARGE (A) NEGATIVE  Urine microscopic-add on     Status: Abnormal   Collection Time: 12/22/14 11:49 PM  Result Value Ref Range   Squamous Epithelial / LPF FEW (A) RARE   WBC, UA 7-10 <3 WBC/hpf   Bacteria, UA MANY (A) RARE  POC urine preg, ED (not at [facility])     Status: None   Collection Time: 12/22/14 11:54 PM  Result Value Ref Range   Preg Test, Ur NEGATIVE NEGATIVE    Comment:        THE SENSITIVITY OF THIS METHODOLOGY IS >24 mIU/mL     US Transvaginal  Non-ob  12/23/2014   CLINICAL DATA:  Pelvic pain  EXAM: TRANSABDOMINAL AND TRANSVAGINAL ULTRASOUND OF PELVIS  DOPPLER ULTRASOUND OF OVARIES  TECHNIQUE: Both transabdominal and transvaginal ultrasound examinations of the pelvis were performed. Transabdominal technique was performed for global imaging of the pelvis including uterus, ovaries, adnexal regions, and pelvic cul-de-sac.  It was necessary to proceed with endovaginal exam following the transabdominal exam to visualize the ovaries. Color and duplex Doppler ultrasound was utilized to evaluate blood flow to the ovaries.  COMPARISON:  None.  FINDINGS: Uterus  Measurements: 9.0 x 3.9  x 5.3 cm. Nabothian cysts are identified.  Endometrium  Thickness: 1.4 mm. Minimal amount of fluid is noted within the endometrial canal.  Right ovary  Measurements: 2.1 x 1.0 x 1.5 cm. Normal appearance/no adnexal mass.  Left ovary  Measurements: 2.8 x 2.2 x 1.6 cm. A 1.3 cm cyst is noted within the left ovary.  Pulsed Doppler evaluation of both ovaries demonstrates normal low-resistance arterial and venous waveforms.  Other findings  No free fluid.  IMPRESSION: Left ovarian cyst.  No acute abnormality is noted.   Electronically Signed   By: Inez Catalina M.D.   On: 12/23/2014 08:54   US Pelvis Complete  12/23/2014   CLINICAL DATA:  Pelvic pain  EXAM: TRANSABDOMINAL AND TRANSVAGINAL ULTRASOUND OF PELVIS  DOPPLER ULTRASOUND OF OVARIES  TECHNIQUE: Both transabdominal and transvaginal ultrasound examinations of the pelvis were performed. Transabdominal technique was performed for global imaging of the pelvis including uterus, ovaries, adnexal regions, and pelvic cul-de-sac.  It was necessary to proceed with endovaginal exam following the transabdominal exam to visualize the ovaries. Color and duplex Doppler ultrasound was utilized to evaluate blood flow to the ovaries.  COMPARISON:  None.  FINDINGS: Uterus  Measurements: 9.0 x 3.9 x 5.3 cm. Nabothian cysts are identified.  Endometrium  Thickness: 1.4 mm. Minimal amount of fluid is noted within the endometrial canal.  Right ovary  Measurements: 2.1 x 1.0 x 1.5 cm. Normal appearance/no adnexal mass.  Left ovary  Measurements: 2.8 x 2.2 x 1.6 cm. A 1.3 cm cyst is noted within the left ovary.  Pulsed Doppler evaluation of both ovaries demonstrates normal low-resistance arterial and venous waveforms.  Other findings  No free fluid.  IMPRESSION: Left ovarian cyst.  No acute abnormality is noted.   Electronically Signed   By: Inez Catalina M.D.   On: 12/23/2014 08:54   Ct Abdomen Pelvis W Contrast  12/23/2014   CLINICAL DATA:  DISH evaluation for upper abdominal pain  radiating to back. Nausea, vomiting, and diarrhea. History of prior appendectomy. Mildly elevated lipase and white count.  EXAM: CT ABDOMEN AND PELVIS WITH CONTRAST  TECHNIQUE: Multidetector CT imaging of the abdomen and pelvis was performed using the standard protocol following bolus administration of intravenous contrast.  CONTRAST:  181m OMNIPAQUE IOHEXOL 300 MG/ML  SOLN  COMPARISON:  Prior CT from 10/07/2011  FINDINGS: The visualized lung bases are clear. No pleural or pericardial effusion.  The liver demonstrates a normal contrast enhanced appearance. Gallbladder within normal limits. No biliary dilatation. The spleen and adrenal glands are normal.  The pancreas demonstrates a normal CT appearance. No significant peripancreatic inflammatory stranding identified. There are no peripancreatic fluid collections. No evidence for acute pancreatitis at this time.  Kidneys are equal in size with symmetric enhancement. No nephrolithiasis, hydronephrosis, or focal enhancing renal mass.  Stomach within normal limits. No evidence for bowel obstruction. Appendix not visualized, compatible with history of prior appendectomy. No abnormal  wall thickening, mucosal enhancement, or inflammatory changes seen about the bowels.  Bladder within normal limits.  Uterus and ovaries are normal.  No free air or fluid. No pathologically enlarged intra-abdominal pelvic lymph nodes.  No acute osseous abnormality. No worrisome lytic or blastic osseous lesions.  IMPRESSION: 1. No CT evidence for acute intra-abdominal pelvic process. 2. Normal CT appearance of the pancreas. Please note that the pancreas can appear normal on CT in the setting of mild/early acute pancreatitis. 3. Prior appendectomy.   Electronically Signed   By: Jeannine Boga M.D.   On: 12/23/2014 05:00   [place] Art/ven Flow Abd Pelv Doppler  12/23/2014   CLINICAL DATA:  Pelvic pain  EXAM: TRANSABDOMINAL AND TRANSVAGINAL ULTRASOUND OF PELVIS  DOPPLER ULTRASOUND OF OVARIES   TECHNIQUE: Both transabdominal and transvaginal ultrasound examinations of the pelvis were performed. Transabdominal technique was performed for global imaging of the pelvis including uterus, ovaries, adnexal regions, and pelvic cul-de-sac.  It was necessary to proceed with endovaginal exam following the transabdominal exam to visualize the ovaries. Color and duplex Doppler ultrasound was utilized to evaluate blood flow to the ovaries.  COMPARISON:  None.  FINDINGS: Uterus  Measurements: 9.0 x 3.9 x 5.3 cm. Nabothian cysts are identified.  Endometrium  Thickness: 1.4 mm. Minimal amount of fluid is noted within the endometrial canal.  Right ovary  Measurements: 2.1 x 1.0 x 1.5 cm. Normal appearance/no adnexal mass.  Left ovary  Measurements: 2.8 x 2.2 x 1.6 cm. A 1.3 cm cyst is noted within the left ovary.  Pulsed Doppler evaluation of both ovaries demonstrates normal low-resistance arterial and venous waveforms.  Other findings  No free fluid.  IMPRESSION: Left ovarian cyst.  No acute abnormality is noted.   Electronically Signed   By: Inez Catalina M.D.   On: 12/23/2014 08:54   US Abdomen Limited Ruq  12/23/2014   CLINICAL DATA:  Right upper quadrant pain.  EXAM: US ABDOMEN LIMITED - RIGHT UPPER QUADRANT  COMPARISON:  None.  FINDINGS: Gallbladder:  Tiny stones and mild sludge demonstrated in the dependent portion of the gallbladder. No gallbladder wall thickening or edema. Murphy's sign is negative.  Common bile duct:  Diameter: 3 mm, normal. Portions of the bowel ducts are obscured by overlying bowel gas.  Liver:  No focal lesion identified. Within normal limits in parenchymal echogenicity.  IMPRESSION: Cholelithiasis with tiny stones and sludge demonstrated in the gallbladder. No additional signs of cholecystitis.   Electronically Signed   By: Lucienne Capers M.D.   On: 12/23/2014 01:36    Review of Systems  Constitutional: Positive for fever (some a couple days ago). Negative for chills, weight loss,  malaise/fatigue and diaphoresis.  HENT: Negative.   Eyes: Negative.   Respiratory: Negative.   Cardiovascular: Positive for leg swelling (occasionally).  Gastrointestinal: Positive for heartburn, nausea, vomiting, abdominal pain and diarrhea (some a couple days ago, but none currently). Negative for constipation, blood in stool and melena.  Genitourinary:       Some odor  Musculoskeletal:       She has leg pain and take Neurontin at night for this. She also complains of lower leg pain sometimes when walking  Skin: Negative.   Neurological: Negative.  Negative for weakness.  Endo/Heme/Allergies: Positive for environmental allergies (she had multiple allergies, and most of her hospital visist are related to allergies.).  Psychiatric/Behavioral: Negative for depression, suicidal ideas, memory loss and substance abuse. The patient is nervous/anxious (She has had panic attacks, but this  is not one of them). The patient does not have insomnia.    Blood pressure 120/65, pulse 112, temperature 98.4 F (36.9 C), temperature source Oral, resp. rate 18, SpO2 96 %. Physical Exam  Constitutional: She is oriented to person, place, and time. She appears well-developed and well-nourished. She appears distressed (she is complaining of significant pain.  RUQ and RLQ).  HENT:  Head: Normocephalic and atraumatic.  Nose: Nose normal.  Eyes: Conjunctivae and EOM are normal. Right eye exhibits no discharge. Left eye exhibits no discharge. No scleral icterus.  Neck: Normal range of motion. Neck supple. No JVD present. No tracheal deviation present. No thyromegaly present.  Cardiovascular: Regular rhythm, normal heart sounds and intact distal pulses.   No murmur heard. Tachycardic  Respiratory: Effort normal and breath sounds normal. No respiratory distress. She has no wheezes. She has no rales. She exhibits no tenderness.  GI: Soft. Bowel sounds are normal. She exhibits no distension and no mass. There is  tenderness (she cannot get comfortable,  some discomfort with touching left side.  severe pain RUQ > RLQ). There is no rebound and no guarding.  Musculoskeletal: She exhibits no edema or tenderness.  Lymphadenopathy:    She has no cervical adenopathy.  Neurological: She is alert and oriented to person, place, and time. No cranial nerve deficit.  Skin: Skin is warm and dry. No rash noted. She is not diaphoretic. No erythema. No pallor.  Psychiatric: She has a normal mood and affect. Her behavior is normal. Judgment and thought content normal.  She is having allot of pain, using pillow for support.  She did relax some and show less emotion with multiple questions about other issues.    Assessment/Plan: 1.Right sided abdominal pain 2.  Possible gallstone pancreatitis 3.  GERD 4.  Hx of tobacco use 5.  Multiple allergies  -  Has an Epipen 6. 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  Hx of Thalassemia Minor 8.  BMI 34 9.  Possible UTI 10.  Anxiety   Plan:  Agree with medicine admit, repeat labs in AM, hydrate, pain control, keep her NPO.  She cannot have HIDA scan with this amount of narcotics on board.  Will follow with you.   Sandor Arboleda 12/23/2014, 9:50 AM

## 2014-12-23 NOTE — ED Notes (Signed)
Patient transported to Ultrasound 

## 2014-12-23 NOTE — H&P (Addendum)
[facility] History and Physical  Crystal Orie FishermanM Myers HKV:[id number]:[id number] DOB: Aug 25, 1981 DOA: 12/22/2014   PCP: Delbert HarnessBRISCOE, KIM, MD    Chief Complaint: abdominal pain  HPI: Crystal Myers is a 34 y.o. female who presents with abdominal pain. She states the pain started 3-4 days ago when she developed diffuse abdominal aching with vomiting and diarrhea. The vomiting and diarrhea resolved by yesterday but the pain became suddenly severe yesterday. It is present throughout the abdomen but most severe in the RUQ 9/10 and radiating to her back. No exacerbating factors. Multiple doses have dilaudid have not alleviated it. She is also stating that she is itching after being given the dilaudid. She no longer wants narcotics.    General: The patient denies anorexia, fever low grade on Wed and Thurday, weight loss Cardiac: Denies chest pain, syncope, palpitations, pedal edema  Respiratory: Denies cough, shortness of breath, wheezing GI: + indigestion/heartburn, abdominal pain, nausea, vomiting, diarrhea and constipation- last BM yesterday GU: Denies hematuria, incontinence, dysuria - has itching in vaginal area x 3 days- with yellow discharge Musculoskeletal: + back pain  Skin: Denies suspicious skin lesions Neurologic: Denies focal weakness or numbness, change in vision Psychiatry: Denies depression + mild anxiety. Hematologic:+  bruising -   Past Medical History  Diagnosis Date  . Anemia   . Chlamydia     Past Surgical History  Procedure Laterality Date  . Cesarean section    . Appendectomy    . Tonsillectomy    . Cesarean section      x4  . Tubal ligation      Social History: does not smoke cigarettes or drinks alcohol- stoped smoking 29 days ago.  Lives at home.     Allergies  Allergen Reactions  . Augmentin [Amoxicillin-Pot Clavulanate] Itching  . Percocet [Oxycodone-Acetaminophen] Itching  . Penicillins Rash    Family History  Problem Relation Age of Onset  . Diabetes Mother    . Hypertension Mother   . CAD Mother      Prior to Admission medications   Medication Sig Start Date End Date Taking? Authorizing Provider  albuterol (PROVENTIL HFA;VENTOLIN HFA) 108 (90 BASE) MCG/ACT inhaler Inhale 2 puffs into the lungs every 4 (four) hours as needed for wheezing or shortness of breath. 07/25/14  Yes Shon Batonourtney F Horton, MD  EPINEPHrine (EPIPEN 2-PAK) 0.3 mg/0.3 mL IJ SOAJ injection Inject 1 Dose as directed as needed. 08/19/14  Yes Historical Provider, MD  FLUoxetine (PROZAC) 20 MG capsule Take 40 mg by mouth at bedtime.    Yes Historical Provider, MD  gabapentin (NEURONTIN) 300 MG capsule Take 300 mg by mouth at bedtime.   Yes Historical Provider, MD  Levonorgestrel-Ethinyl Estradiol (SEASONIQUE) 0.15-0.03 &0.01 MG tablet Take 1 tablet by mouth daily.     Yes Historical Provider, MD  loratadine (CLARITIN) 10 MG tablet Take 1 tablet (10 mg total) by mouth daily. 07/25/14  Yes Shon Batonourtney F Horton, MD  montelukast (SINGULAIR) 10 MG tablet Take 10 mg by mouth at bedtime.   Yes Historical Provider, MD  Olopatadine HCl (PATADAY) 0.2 % SOLN Place 1 drop into both eyes as needed (allergies).  08/19/14  Yes Historical Provider, MD  omeprazole (PRILOSEC) 40 MG capsule Take 40 mg by mouth daily. 08/19/14  Yes Historical Provider, MD  PATANASE 0.6 % SOLN Place 1 spray into both nostrils at bedtime.  08/22/14  Yes Historical Provider, MD  ondansetron (ZOFRAN ODT) 4 MG disintegrating tablet Take 1 tablet (4 mg total) by mouth every 8 (  eight) hours as needed for nausea or vomiting. Patient not taking: Reported on 12/14/2014 10/17/14   Emilia Beck, PA-C     Physical Exam: Filed Vitals:   12/23/14 0538 12/23/14 0656 12/23/14 0859 12/23/14 1101  BP: 103/57  120/65 119/78  Pulse: 94  112 98  Temp: 97.6 F (36.4 C) 98.4 F (36.9 C)  98 F (36.7 C)  TempSrc: Oral Oral  Oral  Resp: 16  18 18   SpO2: 97%  96% 95%     General: AAO x3, with mild discomfort.  HEENT: Normocephalic and  Atraumatic, Mucous membranes pink                PERRLA; EOM intact; No scleral icterus,                 Nares: Patent, Oropharynx: Clear, Fair Dentition                 Neck: FROM, no cervical lymphadenopathy, thyromegaly, carotid bruit or JVD;  Breasts: deferred CHEST WALL: No tenderness  CHEST: Normal respiration, clear to auscultation bilaterally  HEART: Regular rate and rhythm; no murmurs rubs or gallops  BACK: No kyphosis or scoliosis; no CVA tenderness  GI: Positive Bowel Sounds, soft, tender diffusely but very tender in RUQ, RLQ and epigastrium; no masses, no organomegaly Rectal Exam: deferred MSK: No cyanosis, clubbing, or edema Genitalia: not examined  SKIN:  no rash or ulceration  CNS: Alert and Oriented x 4, Nonfocal exam, CN 2-12 intact  Labs on Admission:  Basic Metabolic Panel:  Recent Labs Lab 12/22/14 2306 12/23/14 0959  NA 139 138  K 4.1 4.4  CL 107 105  CO2 22 24  GLUCOSE 120* 99  BUN 10 10  CREATININE 0.69 0.60  CALCIUM 9.2 8.6   Liver Function Tests:  Recent Labs Lab 12/22/14 2331 12/23/14 0959  AST 23 18  ALT 13 13  ALKPHOS 64 58  BILITOT 0.5 0.6  PROT 7.4 7.2  ALBUMIN 4.0 3.8    Recent Labs Lab 12/22/14 2331 12/23/14 0959  LIPASE 73* 186*   No results for input(s): AMMONIA in the last 168 hours. CBC:  Recent Labs Lab 12/22/14 2306 12/23/14 0959  WBC 11.9* 10.4  NEUTROABS 5.9 7.1  HGB 12.2 10.8*  HCT 38.8 35.0*  MCV 62.0* 63.1*  PLT 309 282   Cardiac Enzymes: No results for input(s): CKTOTAL, CKMB, CKMBINDEX, TROPONINI in the last 168 hours.  BNP (last 3 results) No results for input(s): PROBNP in the last 8760 hours. CBG: No results for input(s): GLUCAP in the last 168 hours.  Radiological Exams on Admission: US Transvaginal Non-ob  12/23/2014   CLINICAL DATA:  Pelvic pain  EXAM: TRANSABDOMINAL AND TRANSVAGINAL ULTRASOUND OF PELVIS  DOPPLER ULTRASOUND OF OVARIES  TECHNIQUE: Both transabdominal and transvaginal  ultrasound examinations of the pelvis were performed. Transabdominal technique was performed for global imaging of the pelvis including uterus, ovaries, adnexal regions, and pelvic cul-de-sac.  It was necessary to proceed with endovaginal exam following the transabdominal exam to visualize the ovaries. Color and duplex Doppler ultrasound was utilized to evaluate blood flow to the ovaries.  COMPARISON:  None.  FINDINGS: Uterus  Measurements: 9.0 x 3.9 x 5.3 cm. Nabothian cysts are identified.  Endometrium  Thickness: 1.4 mm. Minimal amount of fluid is noted within the endometrial canal.  Right ovary  Measurements: 2.1 x 1.0 x 1.5 cm. Normal appearance/no adnexal mass.  Left ovary  Measurements: 2.8 x 2.2 x 1.6 cm. A 1.3  cm cyst is noted within the left ovary.  Pulsed Doppler evaluation of both ovaries demonstrates normal low-resistance arterial and venous waveforms.  Other findings  No free fluid.  IMPRESSION: Left ovarian cyst.  No acute abnormality is noted.   Electronically Signed   By: Alcide Clever M.D.   On: 12/23/2014 08:54   US Pelvis Complete  12/23/2014   CLINICAL DATA:  Pelvic pain  EXAM: TRANSABDOMINAL AND TRANSVAGINAL ULTRASOUND OF PELVIS  DOPPLER ULTRASOUND OF OVARIES  TECHNIQUE: Both transabdominal and transvaginal ultrasound examinations of the pelvis were performed. Transabdominal technique was performed for global imaging of the pelvis including uterus, ovaries, adnexal regions, and pelvic cul-de-sac.  It was necessary to proceed with endovaginal exam following the transabdominal exam to visualize the ovaries. Color and duplex Doppler ultrasound was utilized to evaluate blood flow to the ovaries.  COMPARISON:  None.  FINDINGS: Uterus  Measurements: 9.0 x 3.9 x 5.3 cm. Nabothian cysts are identified.  Endometrium  Thickness: 1.4 mm. Minimal amount of fluid is noted within the endometrial canal.  Right ovary  Measurements: 2.1 x 1.0 x 1.5 cm. Normal appearance/no adnexal mass.  Left ovary   Measurements: 2.8 x 2.2 x 1.6 cm. A 1.3 cm cyst is noted within the left ovary.  Pulsed Doppler evaluation of both ovaries demonstrates normal low-resistance arterial and venous waveforms.  Other findings  No free fluid.  IMPRESSION: Left ovarian cyst.  No acute abnormality is noted.   Electronically Signed   By: Alcide Clever M.D.   On: 12/23/2014 08:54   Ct Abdomen Pelvis W Contrast  12/23/2014   CLINICAL DATA:  DISH evaluation for upper abdominal pain radiating to back. Nausea, vomiting, and diarrhea. History of prior appendectomy. Mildly elevated lipase and white count.  EXAM: CT ABDOMEN AND PELVIS WITH CONTRAST  TECHNIQUE: Multidetector CT imaging of the abdomen and pelvis was performed using the standard protocol following bolus administration of intravenous contrast.  CONTRAST:  OMNIPAQUE IOHEXOL 300 MG/ML  SOLN  COMPARISON:  Prior CT from 10/07/2011  FINDINGS: The visualized lung bases are clear. No pleural or pericardial effusion.  The liver demonstrates a normal contrast enhanced appearance. Gallbladder within normal limits. No biliary dilatation. The spleen and adrenal glands are normal.  The pancreas demonstrates a normal CT appearance. No significant peripancreatic inflammatory stranding identified. There are no peripancreatic fluid collections. No evidence for acute pancreatitis at this time.  Kidneys are equal in size with symmetric enhancement. No nephrolithiasis, hydronephrosis, or focal enhancing renal mass.  Stomach within normal limits. No evidence for bowel obstruction. Appendix not visualized, compatible with history of prior appendectomy. No abnormal wall thickening, mucosal enhancement, or inflammatory changes seen about the bowels.  Bladder within normal limits.  Uterus and ovaries are normal.  No free air or fluid. No pathologically enlarged intra-abdominal pelvic lymph nodes.  No acute osseous abnormality. No worrisome lytic or blastic osseous lesions.  IMPRESSION: 1. No CT  evidence for acute intra-abdominal pelvic process. 2. Normal CT appearance of the pancreas. Please note that the pancreas can appear normal on CT in the setting of mild/early acute pancreatitis. 3. Prior appendectomy.   Electronically Signed   By: Rise Mu M.D.   On: 12/23/2014 05:00   [place] Art/ven Flow Abd Pelv Doppler  12/23/2014   CLINICAL DATA:  Pelvic pain  EXAM: TRANSABDOMINAL AND TRANSVAGINAL ULTRASOUND OF PELVIS  DOPPLER ULTRASOUND OF OVARIES  TECHNIQUE: Both transabdominal and transvaginal ultrasound examinations of the pelvis were performed. Transabdominal technique was performed  for global imaging of the pelvis including uterus, ovaries, adnexal regions, and pelvic cul-de-sac.  It was necessary to proceed with endovaginal exam following the transabdominal exam to visualize the ovaries. Color and duplex Doppler ultrasound was utilized to evaluate blood flow to the ovaries.  COMPARISON:  None.  FINDINGS: Uterus  Measurements: 9.0 x 3.9 x 5.3 cm. Nabothian cysts are identified.  Endometrium  Thickness: 1.4 mm. Minimal amount of fluid is noted within the endometrial canal.  Right ovary  Measurements: 2.1 x 1.0 x 1.5 cm. Normal appearance/no adnexal mass.  Left ovary  Measurements: 2.8 x 2.2 x 1.6 cm. A 1.3 cm cyst is noted within the left ovary.  Pulsed Doppler evaluation of both ovaries demonstrates normal low-resistance arterial and venous waveforms.  Other findings  No free fluid.  IMPRESSION: Left ovarian cyst.  No acute abnormality is noted.   Electronically Signed   By: Alcide Clever M.D.   On: 12/23/2014 08:54   US Abdomen Limited Ruq  12/23/2014   CLINICAL DATA:  Right upper quadrant pain.  EXAM: US ABDOMEN LIMITED - RIGHT UPPER QUADRANT  COMPARISON:  None.  FINDINGS: Gallbladder:  Tiny stones and mild sludge demonstrated in the dependent portion of the gallbladder. No gallbladder wall thickening or edema. Murphy's sign is negative.  Common bile duct:  Diameter: 3 mm, normal. Portions  of the bowel ducts are obscured by overlying bowel gas.  Liver:  No focal lesion identified. Within normal limits in parenchymal echogenicity.  IMPRESSION: Cholelithiasis with tiny stones and sludge demonstrated in the gallbladder. No additional signs of cholecystitis.   Electronically Signed   By: Burman Nieves M.D.   On: 12/23/2014 01:36     Assessment/Plan Active Problems: Abdominal pain- possible acute pancreatitis-  - may have started out as a gastroenteritis but pain has now changed and more focused in RUQ and epigastrium - Lipase elevated but CT negative for pancreatic inflammation  - Cholelithiasis noted on imaging- will order a HIDA- stop narcotics, start PRN Toradol for pain - clear liquids only - Cipro and Cefotetan ordered by ER - will continue - small ovarian cyst noted on ultrasound- not of any significance   multiple allergies - cont outpt meds.   Pyuria - patient asymptomatic  Vaginal wall itching - Miconazole cream ordered   Consulted: Gen surgery  Code Status: full code  Family Communication:   DVT Prophylaxis:Lovenox  Time spent: 55 min  Travontae Freiberger, MD [facility]  If 7PM-7AM, please contact night-coverage [email or web address] 12/23/2014, 11:33 AM

## 2014-12-23 NOTE — ED Provider Notes (Signed)
PROGRESS NOTE                                                                                                                 This is a sign-out from PA Upstill  at shift change: Crystal Myers is a 34 y.o. female presenting with severe and difficult to control right upper quadrant pain. Ultrasound shows cholelithiasis with no signs of cholecystitis. CT with no acute findings. Patient is pending ultrasound to rule out torsion. Attending physician Dr. not of Oddi has spoken with general surgery. Patient will need admission for pain control and symptomatic cholelithiasis and HIDA scan. Urinalysis appears dirty, will treat with cefotetan. Please refer to previous note for full HPI, ROS, PMH and PE.   Since seen and examined at the bedside, Crystal Myers is in acute pain. Describes her pain as 10 out of 10, says it's in the right upper quadrant. States the pain started 4 days ago and is exacerbated postprandially. Crystal Myers has had several episodes of nausea and vomiting after eating. Patient denies dysuria, hematuria, urinary frequency, abnormal discharge. Heart is a regular rate and rhythm mildly tachycardic. Lung sounds are clear to auscultation bilaterally. Patient is diffusely tender to palpation on the abdomen especially right upper quadrant there are no peritoneal signs. No CVA tenderness palpation bilaterally.   Case discussed with CCS will Marlyne BeardsJennings. He agrees with admission to medicine. He requests a repeat blood work and starting Cipro to cover for possible intra-abdominal infection and UTI.  Case discussed with [facility] Dr.  Butler Denmarkizwan who accepts admission  [place] Transvaginal Non-ob  12/23/2014   CLINICAL DATA:  Pelvic pain  EXAM: TRANSABDOMINAL AND TRANSVAGINAL ULTRASOUND OF PELVIS  DOPPLER ULTRASOUND OF OVARIES  TECHNIQUE: Both transabdominal and transvaginal ultrasound examinations of the pelvis were performed. Transabdominal technique was performed for global imaging of the pelvis including uterus, ovaries,  adnexal regions, and pelvic cul-de-sac.  It was necessary to proceed with endovaginal exam following the transabdominal exam to visualize the ovaries. Color and duplex Doppler ultrasound was utilized to evaluate blood flow to the ovaries.  COMPARISON:  None.  FINDINGS: Uterus  Measurements: 9.0 x 3.9 x 5.3 cm. Nabothian cysts are identified.  Endometrium  Thickness: 1.4 mm. Minimal amount of fluid is noted within the endometrial canal.  Right ovary  Measurements: 2.1 x 1.0 x 1.5 cm. Normal appearance/no adnexal mass.  Left ovary  Measurements: 2.8 x 2.2 x 1.6 cm. A 1.3 cm cyst is noted within the left ovary.  Pulsed Doppler evaluation of both ovaries demonstrates normal low-resistance arterial and venous waveforms.  Other findings  No free fluid.  IMPRESSION: Left ovarian cyst.  No acute abnormality is noted.   Electronically Signed   By: Alcide CleverMark  Lukens M.D.   On: 12/23/2014 08:54   [place] Pelvis Complete  12/23/2014   CLINICAL DATA:  Pelvic pain  EXAM: TRANSABDOMINAL AND TRANSVAGINAL ULTRASOUND OF PELVIS  DOPPLER ULTRASOUND OF OVARIES  TECHNIQUE: Both transabdominal and transvaginal ultrasound examinations of the pelvis were performed. Transabdominal technique was  performed for global imaging of the pelvis including uterus, ovaries, adnexal regions, and pelvic cul-de-sac.  It was necessary to proceed with endovaginal exam following the transabdominal exam to visualize the ovaries. Color and duplex Doppler ultrasound was utilized to evaluate blood flow to the ovaries.  COMPARISON:  None.  FINDINGS: Uterus  Measurements: 9.0 x 3.9 x 5.3 cm. Nabothian cysts are identified.  Endometrium  Thickness: 1.4 mm. Minimal amount of fluid is noted within the endometrial canal.  Right ovary  Measurements: 2.1 x 1.0 x 1.5 cm. Normal appearance/no adnexal mass.  Left ovary  Measurements: 2.8 x 2.2 x 1.6 cm. A 1.3 cm cyst is noted within the left ovary.  Pulsed Doppler evaluation of both ovaries demonstrates normal low-resistance  arterial and venous waveforms.  Other findings  No free fluid.  IMPRESSION: Left ovarian cyst.  No acute abnormality is noted.   Electronically Signed   By: Alcide Clever M.D.   On: 12/23/2014 08:54   Ct Abdomen Pelvis W Contrast  12/23/2014   CLINICAL DATA:  DISH evaluation for upper abdominal pain radiating to back. Nausea, vomiting, and diarrhea. History of prior appendectomy. Mildly elevated lipase and white count.  EXAM: CT ABDOMEN AND PELVIS WITH CONTRAST  TECHNIQUE: Multidetector CT imaging of the abdomen and pelvis was performed using the standard protocol following bolus administration of intravenous contrast.  CONTRAST:  OMNIPAQUE IOHEXOL 300 MG/ML  SOLN  COMPARISON:  Prior CT from 10/07/2011  FINDINGS: The visualized lung bases are clear. No pleural or pericardial effusion.  The liver demonstrates a normal contrast enhanced appearance. Gallbladder within normal limits. No biliary dilatation. The spleen and adrenal glands are normal.  The pancreas demonstrates a normal CT appearance. No significant peripancreatic inflammatory stranding identified. There are no peripancreatic fluid collections. No evidence for acute pancreatitis at this time.  Kidneys are equal in size with symmetric enhancement. No nephrolithiasis, hydronephrosis, or focal enhancing renal mass.  Stomach within normal limits. No evidence for bowel obstruction. Appendix not visualized, compatible with history of prior appendectomy. No abnormal wall thickening, mucosal enhancement, or inflammatory changes seen about the bowels.  Bladder within normal limits.  Uterus and ovaries are normal.  No free air or fluid. No pathologically enlarged intra-abdominal pelvic lymph nodes.  No acute osseous abnormality. No worrisome lytic or blastic osseous lesions.  IMPRESSION: 1. No CT evidence for acute intra-abdominal pelvic process. 2. Normal CT appearance of the pancreas. Please note that the pancreas can appear normal on CT in the setting of  mild/early acute pancreatitis. 3. Prior appendectomy.   Electronically Signed   By: Rise Mu M.D.   On: 12/23/2014 05:00   [place] Art/ven Flow Abd Pelv Doppler  12/23/2014   CLINICAL DATA:  Pelvic pain  EXAM: TRANSABDOMINAL AND TRANSVAGINAL ULTRASOUND OF PELVIS  DOPPLER ULTRASOUND OF OVARIES  TECHNIQUE: Both transabdominal and transvaginal ultrasound examinations of the pelvis were performed. Transabdominal technique was performed for global imaging of the pelvis including uterus, ovaries, adnexal regions, and pelvic cul-de-sac.  It was necessary to proceed with endovaginal exam following the transabdominal exam to visualize the ovaries. Color and duplex Doppler ultrasound was utilized to evaluate blood flow to the ovaries.  COMPARISON:  None.  FINDINGS: Uterus  Measurements: 9.0 x 3.9 x 5.3 cm. Nabothian cysts are identified.  Endometrium  Thickness: 1.4 mm. Minimal amount of fluid is noted within the endometrial canal.  Right ovary  Measurements: 2.1 x 1.0 x 1.5 cm. Normal appearance/no adnexal mass.  Left ovary  Measurements: 2.8 x 2.2 x 1.6 cm. A 1.3 cm cyst is noted within the left ovary.  Pulsed Doppler evaluation of both ovaries demonstrates normal low-resistance arterial and venous waveforms.  Other findings  No free fluid.  IMPRESSION: Left ovarian cyst.  No acute abnormality is noted.   Electronically Signed   By: Alcide Clever M.D.   On: 12/23/2014 08:54   US Abdomen Limited Ruq  12/23/2014   CLINICAL DATA:  Right upper quadrant pain.  EXAM: US ABDOMEN LIMITED - RIGHT UPPER QUADRANT  COMPARISON:  None.  FINDINGS: Gallbladder:  Tiny stones and mild sludge demonstrated in the dependent portion of the gallbladder. No gallbladder wall thickening or edema. Murphy's sign is negative.  Common bile duct:  Diameter: 3 mm, normal. Portions of the bowel ducts are obscured by overlying bowel gas.  Liver:  No focal lesion identified. Within normal limits in parenchymal echogenicity.  IMPRESSION:  Cholelithiasis with tiny stones and sludge demonstrated in the gallbladder. No additional signs of cholecystitis.   Electronically Signed   By: Burman Nieves M.D.   On: 12/23/2014 01:36     Filed Vitals:   12/23/14 0350 12/23/14 0538 12/23/14 0656 12/23/14 0859  BP:  103/57  120/65  Pulse:  94  112  Temp: 97.4 F (36.3 C) 97.6 F (36.4 C) 98.4 F (36.9 C)   TempSrc: Oral Oral Oral   Resp:  16  18  SpO2:  97%  96%    Medications  cefoTEtan (CEFOTAN) 1 g in dextrose 5 % 50 mL IVPB (not administered)  HYDROmorphone (DILAUDID) injection 2 mg (not administered)  morphine 4 MG/ML injection 4 mg (4 mg Intravenous Given 12/22/14 2347)  ondansetron (ZOFRAN) injection 4 mg (4 mg Intravenous Given 12/22/14 2347)  HYDROmorphone (DILAUDID) injection 1 mg (1 mg Intravenous Given 12/23/14 0056)  HYDROmorphone (DILAUDID) injection 0.5 mg (0.5 mg Intravenous Given 12/23/14 0210)  HYDROmorphone (DILAUDID) injection 0.5 mg (0.5 mg Intravenous Given 12/23/14 0245)  ondansetron (ZOFRAN) injection 4 mg (4 mg Intravenous Given 12/23/14 0346)  HYDROmorphone (DILAUDID) injection 1 mg (1 mg Intravenous Given 12/23/14 0346)  iohexol (OMNIPAQUE) 300 MG/ML solution 100 mL (100 mLs Intravenous Contrast Given 12/23/14 0420)  promethazine (PHENERGAN) injection 25 mg (25 mg Intravenous Given 12/23/14 0414)  HYDROmorphone (DILAUDID) injection 1 mg (1 mg Intravenous Given 12/23/14 0536)  HYDROmorphone (DILAUDID) injection 1 mg (1 mg Intravenous Given 12/23/14 0709)     Joni Reining Eneida Evers, PA-C 12/23/14 1007

## 2014-12-24 ENCOUNTER — Encounter (HOSPITAL_COMMUNITY): Payer: Self-pay | Admitting: Radiology

## 2014-12-24 ENCOUNTER — Inpatient Hospital Stay (HOSPITAL_COMMUNITY): Payer: Medicaid Other | Admitting: Anesthesiology

## 2014-12-24 ENCOUNTER — Inpatient Hospital Stay (HOSPITAL_COMMUNITY): Payer: Medicaid Other

## 2014-12-24 ENCOUNTER — Encounter (HOSPITAL_COMMUNITY)
Admission: EM | Disposition: A | Discharge status: Home / Self Care | Payer: Self-pay | Source: Home / Self Care | Attending: Internal Medicine

## 2014-12-24 DIAGNOSIS — G8929 Other chronic pain: Secondary | ICD-10-CM

## 2014-12-24 DIAGNOSIS — K8020 Calculus of gallbladder without cholecystitis without obstruction: Secondary | ICD-10-CM

## 2014-12-24 DIAGNOSIS — R1010 Upper abdominal pain, unspecified: Secondary | ICD-10-CM

## 2014-12-24 DIAGNOSIS — F32A Depression, unspecified: Secondary | ICD-10-CM | POA: Diagnosis present

## 2014-12-24 DIAGNOSIS — D72829 Elevated white blood cell count, unspecified: Secondary | ICD-10-CM | POA: Diagnosis present

## 2014-12-24 DIAGNOSIS — F329 Major depressive disorder, single episode, unspecified: Secondary | ICD-10-CM | POA: Diagnosis present

## 2014-12-24 DIAGNOSIS — R100 Acute abdomen: Secondary | ICD-10-CM

## 2014-12-24 HISTORY — PX: CHOLECYSTECTOMY: SHX55

## 2014-12-24 LAB — CBC
HCT: 32.3 % — ABNORMAL LOW (ref 36.0–46.0)
HEMOGLOBIN: 9.9 g/dL — AB (ref 12.0–15.0)
MCH: 19.3 pg — ABNORMAL LOW (ref 26.0–34.0)
MCHC: 30.7 g/dL (ref 30.0–36.0)
MCV: 63.1 fL — ABNORMAL LOW (ref 78.0–100.0)
Platelets: 255 10*3/uL (ref 150–400)
RBC: 5.12 MIL/uL — AB (ref 3.87–5.11)
RDW: 15.7 % — ABNORMAL HIGH (ref 11.5–15.5)
WBC: 8.9 10*3/uL (ref 4.0–10.5)

## 2014-12-24 LAB — COMPREHENSIVE METABOLIC PANEL
ALT: 12 U/L (ref 0–35)
AST: 15 U/L (ref 0–37)
Albumin: 3.2 g/dL — ABNORMAL LOW (ref 3.5–5.2)
Alkaline Phosphatase: 51 U/L (ref 39–117)
Anion gap: 8 (ref 5–15)
BUN: 7 mg/dL (ref 6–23)
CO2: 25 mmol/L (ref 19–32)
Calcium: 8.4 mg/dL (ref 8.4–10.5)
Chloride: 107 mmol/L (ref 96–112)
Creatinine, Ser: 0.65 mg/dL (ref 0.50–1.10)
GLUCOSE: 88 mg/dL (ref 70–99)
POTASSIUM: 4.3 mmol/L (ref 3.5–5.1)
SODIUM: 140 mmol/L (ref 135–145)
Total Bilirubin: 0.8 mg/dL (ref 0.3–1.2)
Total Protein: 6 g/dL (ref 6.0–8.3)

## 2014-12-24 LAB — SURGICAL PCR SCREEN
MRSA, PCR: NEGATIVE
Staphylococcus aureus: NEGATIVE

## 2014-12-24 LAB — LIPASE, BLOOD: Lipase: 47 U/L (ref 11–59)

## 2014-12-24 SURGERY — LAPAROSCOPIC CHOLECYSTECTOMY WITH INTRAOPERATIVE CHOLANGIOGRAM
Anesthesia: General | Site: Abdomen

## 2014-12-24 MED ORDER — NEOSTIGMINE METHYLSULFATE 10 MG/10ML IV SOLN
INTRAVENOUS | Status: DC | PRN
  Administered 2014-12-24: 4 mg via INTRAVENOUS

## 2014-12-24 MED ORDER — PROMETHAZINE HCL 25 MG/ML IJ SOLN: 6.2500 mg | INTRAMUSCULAR | Status: DC | PRN

## 2014-12-24 MED ORDER — MIDAZOLAM HCL 2 MG/2ML IJ SOLN
INTRAMUSCULAR | Status: AC
  Filled 2014-12-24: qty 2

## 2014-12-24 MED ORDER — LIDOCAINE HCL (CARDIAC) 20 MG/ML IV SOLN
INTRAVENOUS | Status: AC
  Filled 2014-12-24: qty 5

## 2014-12-24 MED ORDER — LACTATED RINGERS IR SOLN
Status: DC | PRN
  Administered 2014-12-24: 1

## 2014-12-24 MED ORDER — HYDROMORPHONE HCL 2 MG/ML IJ SOLN
INTRAMUSCULAR | Status: AC
  Filled 2014-12-24: qty 1

## 2014-12-24 MED ORDER — POTASSIUM CHLORIDE IN NACL 20-0.9 MEQ/L-% IV SOLN
INTRAVENOUS | Status: DC
  Administered 2014-12-24: 21:00:00 via INTRAVENOUS
  Filled 2014-12-24 (×3): qty 1000

## 2014-12-24 MED ORDER — IOHEXOL 300 MG/ML  SOLN
INTRAMUSCULAR | Status: DC | PRN
  Administered 2014-12-24: 5 mL

## 2014-12-24 MED ORDER — ONDANSETRON HCL 4 MG/2ML IJ SOLN
INTRAMUSCULAR | Status: DC | PRN
  Administered 2014-12-24: 4 mg via INTRAVENOUS

## 2014-12-24 MED ORDER — MIDAZOLAM HCL 5 MG/5ML IJ SOLN
INTRAMUSCULAR | Status: DC | PRN
  Administered 2014-12-24: 2 mg via INTRAVENOUS

## 2014-12-24 MED ORDER — ONDANSETRON HCL 4 MG/2ML IJ SOLN
INTRAMUSCULAR | Status: AC
  Filled 2014-12-24: qty 2

## 2014-12-24 MED ORDER — PROPOFOL 10 MG/ML IV BOLUS
INTRAVENOUS | Status: AC
  Filled 2014-12-24: qty 20

## 2014-12-24 MED ORDER — FENTANYL CITRATE 0.05 MG/ML IJ SOLN: 25.0000 ug | INTRAMUSCULAR | Status: DC | PRN

## 2014-12-24 MED ORDER — FENTANYL CITRATE 0.05 MG/ML IJ SOLN
INTRAMUSCULAR | Status: DC | PRN
  Administered 2014-12-24 (×2): 100 ug via INTRAVENOUS
  Administered 2014-12-24: 50 ug via INTRAVENOUS

## 2014-12-24 MED ORDER — 0.9 % SODIUM CHLORIDE (POUR BTL) OPTIME
TOPICAL | Status: DC | PRN
  Administered 2014-12-24: 1000 mL

## 2014-12-24 MED ORDER — DEXAMETHASONE SODIUM PHOSPHATE 10 MG/ML IJ SOLN
INTRAMUSCULAR | Status: DC | PRN
  Administered 2014-12-24: 10 mg via INTRAVENOUS

## 2014-12-24 MED ORDER — SUCCINYLCHOLINE CHLORIDE 20 MG/ML IJ SOLN
INTRAMUSCULAR | Status: DC | PRN
  Administered 2014-12-24: 100 mg via INTRAVENOUS

## 2014-12-24 MED ORDER — ROCURONIUM BROMIDE 100 MG/10ML IV SOLN
INTRAVENOUS | Status: DC | PRN
  Administered 2014-12-24: 10 mg via INTRAVENOUS
  Administered 2014-12-24: 5 mg via INTRAVENOUS
  Administered 2014-12-24: 10 mg via INTRAVENOUS
  Administered 2014-12-24: 25 mg via INTRAVENOUS

## 2014-12-24 MED ORDER — LIDOCAINE HCL (CARDIAC) 20 MG/ML IV SOLN
INTRAVENOUS | Status: DC | PRN
  Administered 2014-12-24: 50 mg via INTRAVENOUS

## 2014-12-24 MED ORDER — BUPIVACAINE-EPINEPHRINE 0.25% -1:200000 IJ SOLN
INTRAMUSCULAR | Status: AC
  Filled 2014-12-24: qty 1

## 2014-12-24 MED ORDER — GLYCOPYRROLATE 0.2 MG/ML IJ SOLN
INTRAMUSCULAR | Status: DC | PRN
  Administered 2014-12-24: 0.6 mg via INTRAVENOUS

## 2014-12-24 MED ORDER — PROPOFOL 10 MG/ML IV BOLUS
INTRAVENOUS | Status: DC | PRN
  Administered 2014-12-24: 180 mg via INTRAVENOUS

## 2014-12-24 MED ORDER — FENTANYL CITRATE 0.05 MG/ML IJ SOLN
INTRAMUSCULAR | Status: AC
  Filled 2014-12-24: qty 5

## 2014-12-24 MED ORDER — LACTATED RINGERS IV SOLN
INTRAVENOUS | Status: DC | PRN
  Administered 2014-12-24 (×2): via INTRAVENOUS

## 2014-12-24 MED ORDER — TECHNETIUM TC 99M MEBROFENIN IV KIT: 5.4000 | PACK | Freq: Once | INTRAVENOUS | Status: AC | PRN

## 2014-12-24 MED ORDER — MORPHINE SULFATE 2 MG/ML IJ SOLN
2.0000 mg | INTRAMUSCULAR | Status: DC | PRN
  Administered 2014-12-24 – 2014-12-25 (×3): 4 mg via INTRAVENOUS
  Filled 2014-12-24 (×3): qty 2

## 2014-12-24 MED ORDER — DEXAMETHASONE SODIUM PHOSPHATE 10 MG/ML IJ SOLN
INTRAMUSCULAR | Status: AC
  Filled 2014-12-24: qty 1

## 2014-12-24 MED ORDER — ROCURONIUM BROMIDE 100 MG/10ML IV SOLN
INTRAVENOUS | Status: AC
Start: 2014-12-24 — End: 2014-12-24
  Filled 2014-12-24: qty 1

## 2014-12-24 MED ORDER — BUPIVACAINE-EPINEPHRINE 0.25% -1:200000 IJ SOLN
INTRAMUSCULAR | Status: DC | PRN
  Administered 2014-12-24: 20 mL

## 2014-12-24 MED ORDER — HYDROMORPHONE HCL 1 MG/ML IJ SOLN
INTRAMUSCULAR | Status: DC | PRN
  Administered 2014-12-24: 1 mg via INTRAVENOUS
  Administered 2014-12-24 (×2): 0.5 mg via INTRAVENOUS

## 2014-12-24 MED ORDER — MEPERIDINE HCL 25 MG/ML IJ SOLN: 6.2500 mg | INTRAMUSCULAR | Status: DC | PRN

## 2014-12-24 MED ORDER — LACTATED RINGERS IV SOLN: INTRAVENOUS | Status: DC

## 2014-12-24 MED ORDER — BUPIVACAINE-EPINEPHRINE (PF) 0.25% -1:200000 IJ SOLN
INTRAMUSCULAR | Status: AC
  Filled 2014-12-24: qty 30

## 2014-12-24 SURGICAL SUPPLY — 29 items
APPLIER CLIP ROT 10 11.4 M/L (STAPLE) ×3
APR CLP MED LRG 11.4X10 (STAPLE) ×1
BAG SPEC RTRVL LRG 6X4 10 (ENDOMECHANICALS) ×1
CATH REDDICK CHOLANGI 4FR 50CM (CATHETERS) IMPLANT
CHLORAPREP W/TINT 26ML (MISCELLANEOUS) ×3 IMPLANT
CLIP APPLIE ROT 10 11.4 M/L (STAPLE) ×1 IMPLANT
COVER MAYO STAND STRL (DRAPES) ×3 IMPLANT
DECANTER SPIKE VIAL GLASS SM (MISCELLANEOUS) ×3 IMPLANT
DRAPE C-ARM 42X120 X-RAY (DRAPES) ×3 IMPLANT
DRAPE LAPAROSCOPIC ABDOMINAL (DRAPES) ×3 IMPLANT
ELECT REM PT RETURN 9FT ADLT (ELECTROSURGICAL) ×3
ELECTRODE REM PT RTRN 9FT ADLT (ELECTROSURGICAL) ×1 IMPLANT
GLOVE BIOGEL PI IND STRL 7.5 (GLOVE) ×1 IMPLANT
GLOVE BIOGEL PI INDICATOR 7.5 (GLOVE) ×2
GLOVE ECLIPSE 7.5 STRL STRAW (GLOVE) ×3 IMPLANT
GOWN STRL REUS W/TWL XL LVL3 (GOWN DISPOSABLE) ×12 IMPLANT
HEMOSTAT SNOW SURGICEL 2X4 (HEMOSTASIS) IMPLANT
KIT BASIN OR (CUSTOM PROCEDURE TRAY) ×3 IMPLANT
LIQUID BAND (GAUZE/BANDAGES/DRESSINGS) ×3 IMPLANT
POUCH SPECIMEN RETRIEVAL 10MM (ENDOMECHANICALS) ×2 IMPLANT
SCISSORS LAP 5X35 DISP (ENDOMECHANICALS) ×3 IMPLANT
SET CHOLANGIOGRAPH MIX (MISCELLANEOUS) ×3 IMPLANT
SET IRRIG TUBING LAPAROSCOPIC (IRRIGATION / IRRIGATOR) ×3 IMPLANT
SLEEVE XCEL OPT CAN 5 100 (ENDOMECHANICALS) ×3 IMPLANT
SUT MNCRL AB 4-0 PS2 18 (SUTURE) ×3 IMPLANT
TRAY LAPAROSCOPIC (CUSTOM PROCEDURE TRAY) ×3 IMPLANT
TROCAR BLADELESS OPT 5 100 (ENDOMECHANICALS) ×3 IMPLANT
TROCAR XCEL BLUNT TIP 100MML (ENDOMECHANICALS) ×3 IMPLANT
TROCAR XCEL NON-BLD 11X100MML (ENDOMECHANICALS) ×3 IMPLANT

## 2014-12-24 NOTE — Transfer of Care (Signed)
Immediate Anesthesia Transfer of Care Note  Patient: Crystal Myers  Procedure(s) Performed: Procedure(s): LAPAROSCOPIC CHOLECYSTECTOMY WITH INTRAOPERATIVE CHOLANGIOGRAM (N/A)  Patient Location: PACU  Anesthesia Type:General  Level of Consciousness: awake, alert  and oriented  Airway & Oxygen Therapy: Patient Spontanous Breathing and Patient connected to face mask oxygen  Post-op Assessment: Report given to RN and Post -op Vital signs reviewed and stable  Post vital signs: Reviewed and stable  Last Vitals:  Filed Vitals:   12/24/14 1420  BP: 115/77  Pulse: 76  Temp: 36.7 C  Resp: 16    Complications: No apparent anesthesia complications

## 2014-12-24 NOTE — Op Note (Signed)
Preoperative diagnosis: Cholelithiasis and cholecystitis  Postoperative diagnosis: Cholelithiasis and cholecystitis  Surgical procedure: Laparoscopic cholecystectomy with intraoperative cholangiogram  Surgeon: Sharlet SalinaBenjamin T. Maraya Gwilliam M.D.  Assistant: None  Anesthesia: General Endotracheal  Complications: None  Estimated blood loss: Minimal  Description of procedure: The patient brought to the operating room, placed in the supine position on the operating table, and general endotracheal anesthesia induced. The abdomen was widely sterilely prepped and draped. The patient had received preoperative IV antibiotics and PAS were in place. Patient timeout was performed the correct procedure verified. Standard 4 port technique was used with an open Hassan cannula at the umbilicus and the remainder of the ports placed under direct vision. The gallbladder was visualized. It appeared non inflammed. The fundus was grasped and elevated up over the liver and the infundibulum retracted inferiolaterally. Peritoneum anterior and posterior to close triangle was incised and fibrofatty tissue stripped off the neck of the gallbladder toward the porta hepatis. The distal gallbladder was thoroughly dissected. The cystic artery was identified in close triangle and the cystic duct gallbladder junction dissected 360.  A good critical view was obtained. When the anatomy was clear the cystic duct was clipped at the gallbladder junction and an operative cholangiogram obtained through the cystic duct. This showed good filling of a normal common bile duct and intrahepatic ducts with free flow into the duodenum.  There was a single very rounded filling defect in the common bile duct near the cystic duct that moved most consistent with an air bubble. Following this the Cholangiocath was removed and the cystic duct was doubly clipped proximally and divided. The cystic artery was doubly clipped proximally and distally and divided. The  gallbladder was dissected free from its bed using hook cautery and removed through the umbilical port site. Complete hemostasis was obtained in the gallbladder bed. The right upper quadrant was thoroughly irrigated and hemostasis assured. Trochars were removed and all CO2 evacuated and the [facility] fascial defect closed. Skin incisions were closed with subcuticular Monocryl and Dermabond. Sponge needle and instrument counts were correct. The patient was taken to PACU in good condition.  Prajwal Fellner T  12/24/2014

## 2014-12-24 NOTE — Anesthesia Postprocedure Evaluation (Signed)
  Anesthesia Post-op Note  Patient: Crystal Myers  Procedure(s) Performed: Procedure(s): LAPAROSCOPIC CHOLECYSTECTOMY WITH INTRAOPERATIVE CHOLANGIOGRAM (N/A)  Patient Location: PACU  Anesthesia Type:General  Level of Consciousness: awake, alert  and oriented  Airway and Oxygen Therapy: Patient Spontanous Breathing  Post-op Pain: mild  Post-op Assessment: Post-op Vital signs reviewed, Patient's Cardiovascular Status Stable, Respiratory Function Stable, Patent Airway, No signs of Nausea or vomiting and Pain level controlled  Post-op Vital Signs: Reviewed and stable  Last Vitals:  Filed Vitals:   12/24/14 1847  BP:   Pulse: 96  Temp: 37.1 C  Resp: 17    Complications: No apparent anesthesia complications

## 2014-12-24 NOTE — Progress Notes (Signed)
Patient ID: Crystal Myers, female   DOB: 15-Jun-1981, 34 y.o.   MRN: [id number] [facility] PROGRESS NOTE  Crystal Myers WUJ:[id number] DOB: 1981/07/20 DOA: 12/22/2014 PCP: Delbert Harness, MD  Brief narrative:    34 year old female with history of asthma, migraine headaches, depression who presented to [facility] ED with persistent right upper abdominal pain radiating to the back and associated with nausea, vomiting and diarrhea for past few days prior to this admission. Pt reported pain aggravated with food and analgesia at home provided minimal symptomatic relief.  On admission, ultrasound documented multiple gallstones. Pt had transient elevation in lipase which has subsequently normalized consistent with possible possible passed common bile duct stone. LFT's were WNL since admission. Since patient has ongoing pain surgery has recommended proceeding with laparoscopic cholecystectomy with cholangiogram in an effort to relieve her symptoms.  Assessment/Plan:    Active Problems: Right upper quadrant abdominal pain / Cholelithiasis  - Right upper abdominal quadrant pain - bilary colic. Abdominal US showed multiple gallstones.  - Patient had transient elevation in lipase which has subsequently normalized consistent with possible possible passed common bile duct stone. - HIDA scan normal. - LFT's WNL since admission.  - Since patient has ongoing pain surgery has recommended proceeding with laparoscopic cholecystectomy with cholangiogram in an effort to relieve patient symptoms. - Appreciate surgery following.  - Keep NPO for possible surgery in am.  Leukocytosis - possible cholecystitis. Pt started on cefotetan since admission. No fevers. WBC count normalized.    DVT Prophylaxis  - SCD's bilaterally    Code Status: Full.  Family Communication:  plan of care discussed with the patient Disposition Plan: Home when stable.   IV access:  Peripheral IV  Procedures and diagnostic studies:     Nm Hepatobiliary Liver Func 12/24/2014 Normal HIDA scan.  US Transvaginal Non-ob 12/23/2014 Left ovarian cyst.  No acute abnormality is noted.    US Pelvis Complete 12/23/2014   Left ovarian cyst.  No acute abnormality is noted.   Ct Abdomen Pelvis W Contrast 12/23/2014 1. No CT evidence for acute intra-abdominal pelvic process. 2. Normal CT appearance of the pancreas. Please note that the pancreas can appear normal on CT in the setting of mild/early acute pancreatitis. 3. Prior appendectomy.    [place] Art/ven Flow Abd Pelv Doppler 12/23/2014  Left ovarian cyst.  No acute abnormality is noted.     US Abdomen Limited Ruq 12/23/2014 Cholelithiasis with tiny stones and sludge demonstrated in the gallbladder. No additional signs of cholecystitis.     Medical Consultants:  Surgery  Other Consultants:  None   IAnti-Infectives:   Cefotetan 12/23/2014 -->    Manson Passey, MD  [facility] Pager [phone]  If 7PM-7AM, please contact night-coverage [email or web address] Password [email or web address] 12/24/2014, 11:58 AM   LOS: 35 days    HPI/Subjective: No acute overnight events.  Objective: Filed Vitals:   12/23/14 0859 12/23/14 1101 12/23/14 2119 12/24/14 0546  BP: 120/65 119/78 100/64 123/77  Pulse: 112 98 83 77  Temp:  98 F (36.7 C) 98.1 F (36.7 C) 98.2 F (36.8 C)  TempSrc:  Oral Oral Oral  Resp: SpO2: 96% 95% 100% 99%    Intake/Output Summary (Last 24 hours) at 12/24/14 1158 Last data filed at 12/24/14 1006  Gross per 24 hour  Intake      0 ml  Output      0 ml  Net      0 ml  Exam:   General:  Pt is alert, follows commands appropriately, not in acute distress  Cardiovascular: Regular rate and rhythm, S1/S2, no murmurs  Respiratory: Clear to auscultation bilaterally, no wheezing, no crackles, no rhonchi  Abdomen: tender in mid and right upper abdomen, non distended, bowel sounds present  Extremities: No edema, pulses DP and PT palpable bilaterally  Neuro:  Grossly nonfocal  Data Reviewed: Basic Metabolic Panel:  Recent Labs Lab 12/22/14 2306 12/23/14 0959 12/24/14 0513  NA 139 138 140  K 4.1 4.4 4.3  CL 107 105 107  CO2 22 24 25   GLUCOSE 120* 99 88  BUN 10 10 7   CREATININE 0.69 0.60 0.65  CALCIUM 9.2 8.6 8.4   Liver Function Tests:  Recent Labs Lab 12/22/14 2331 12/23/14 0959 12/24/14 0513  AST 23 18 15   ALT 13 13 12   ALKPHOS 64 58 51  BILITOT 0.5 0.6 0.8  PROT 7.4 7.2 6.0  ALBUMIN 4.0 3.8 3.2*    Recent Labs Lab 12/22/14 2331 12/23/14 0959 12/24/14 0513  LIPASE 73* 186* 47   No results for input(s): AMMONIA in the last 168 hours. CBC:  Recent Labs Lab 12/22/14 2306 12/23/14 0959 12/24/14 0513  WBC 11.9* 10.4 8.9  NEUTROABS 5.9 7.1  --   HGB 12.2 10.8* 9.9*  HCT 38.8 35.0* 32.3*  MCV 62.0* 63.1* 63.1*  PLT 309 282 255   Cardiac Enzymes: No results for input(s): CKTOTAL, CKMB, CKMBINDEX, TROPONINI in the last 168 hours. BNP: Invalid input(s): POCBNP CBG: No results for input(s): GLUCAP in the last 168 hours.  No results found for this or any previous visit (from the past 240 hour(s)).   Scheduled Meds: . cefoTEtan (CEFOTAN)   1 g Intravenous Q12H  . enoxaparin (LOVENOX)   40 mg Subcutaneous Q24H  . FLUoxetine  40 mg Oral QHS  . fluticasone  1 spray Each Nare QHS  . gabapentin  300 mg Oral QHS  . loratadine  10 mg Oral Daily  . miconazole  1 application Topical BID  . montelukast  10 mg Oral QHS  . olopatadine  1 drop Both Eyes BID  . pantoprazole  40 mg Oral Daily   Continuous Infusions: . 0.9 % NaCl with KCl 20 mEq / L 125 mL/hr at 12/23/14 1330

## 2014-12-24 NOTE — Progress Notes (Signed)
Patient ID: Crystal Myers, female   DOB: 06-02-1981, 33 y.o.   MRN: [id number]    Subjective: Has ongoing pain in her right upper quadrant unchanged. No other complaints.  Objective: Vital signs in last 24 hours: Temp:  [98.1 F (36.7 C)-98.2 F (36.8 C)] 98.2 F (36.8 C) (01/30 0546) Pulse Rate:  [77-83] 77 (01/30 0546) Resp:  [16-18] 16 (01/30 0546) BP: (100-123)/(64-77) 123/77 mmHg (01/30 0546) SpO2:  [99 %-100 %] 99 % (01/30 0546) Last BM Date: 12/23/14  Intake/Output from previous day:   Intake/Output this shift:    General appearance: alert, cooperative and mild distress GI: abnormal findings:  moderate tenderness in the RUQ  Lab Results:   Recent Labs  12/23/14 0959 12/24/14 0513  WBC 10.4 8.9  HGB 10.8* 9.9*  HCT 35.0* 32.3*  PLT 282 255   BMET  Recent Labs  12/23/14 0959 12/24/14 0513  NA 138 140  K 4.4 4.3  CL 105 107  CO2 24 25  GLUCOSE 99 88  BUN 10 7  CREATININE 0.60 0.65  CALCIUM 8.6 8.4   Hepatic Function Latest Ref Rng 12/24/2014 12/23/2014 12/22/2014  Total Protein 6.0 - 8.3 g/dL 6.0 7.2 7.4  Albumin 3.5 - 5.2 g/dL 3.2(L) 3.8 4.0  AST 0 - 37 U/L '15 18 23  ' ALT 0 - 35 U/L '12 13 13  ' Alk Phosphatase 39 - 117 U/L 51 58 64  Total Bilirubin 0.3 - 1.2 mg/dL 0.8 0.6 0.5  Bilirubin, Direct 0.0 - 0.5 mg/dL - - 0.1   Lipase     Component Value Date/Time   LIPASE 47 12/24/2014 0513     Studies/Results: Nm Hepatobiliary Liver Func  12/24/2014   CLINICAL DATA:  Acute onset of generalized abdominal pain, nausea and vomiting. Initial encounter.  EXAM: NUCLEAR MEDICINE HEPATOBILIARY IMAGING  TECHNIQUE: Sequential images of the abdomen were obtained out to 60 minutes following intravenous administration of radiopharmaceutical.  RADIOPHARMACEUTICALS:  5.4 Millicurie [id number] Choletec  COMPARISON:  Right upper quadrant abdominal ultrasound performed 12/23/2014  FINDINGS: There is normal accumulation of activity within the liver, and activity extends into the  intrahepatic biliary tree. Activity progresses into the small bowel by 10 minutes, and progresses throughout the small bowel. The gallbladder is visualized starting at 50 minutes, and is grossly unremarkable in appearance. There is no evidence for obstruction or cholecystitis.  IMPRESSION: Normal HIDA scan.   Electronically Signed   By: Garald Balding M.D.   On: 12/24/2014 08:32   US Transvaginal Non-ob  12/23/2014   CLINICAL DATA:  Pelvic pain  EXAM: TRANSABDOMINAL AND TRANSVAGINAL ULTRASOUND OF PELVIS  DOPPLER ULTRASOUND OF OVARIES  TECHNIQUE: Both transabdominal and transvaginal ultrasound examinations of the pelvis were performed. Transabdominal technique was performed for global imaging of the pelvis including uterus, ovaries, adnexal regions, and pelvic cul-de-sac.  It was necessary to proceed with endovaginal exam following the transabdominal exam to visualize the ovaries. Color and duplex Doppler ultrasound was utilized to evaluate blood flow to the ovaries.  COMPARISON:  None.  FINDINGS: Uterus  Measurements: 9.0 x 3.9 x 5.3 cm. Nabothian cysts are identified.  Endometrium  Thickness: 1.4 mm. Minimal amount of fluid is noted within the endometrial canal.  Right ovary  Measurements: 2.1 x 1.0 x 1.5 cm. Normal appearance/no adnexal mass.  Left ovary  Measurements: 2.8 x 2.2 x 1.6 cm. A 1.3 cm cyst is noted within the left ovary.  Pulsed Doppler evaluation of both ovaries demonstrates normal low-resistance arterial and venous waveforms.  Other findings  No free fluid.  IMPRESSION: Left ovarian cyst.  No acute abnormality is noted.   Electronically Signed   By: Inez Catalina M.D.   On: 12/23/2014 08:54   US Pelvis Complete  12/23/2014   CLINICAL DATA:  Pelvic pain  EXAM: TRANSABDOMINAL AND TRANSVAGINAL ULTRASOUND OF PELVIS  DOPPLER ULTRASOUND OF OVARIES  TECHNIQUE: Both transabdominal and transvaginal ultrasound examinations of the pelvis were performed. Transabdominal technique was performed for global  imaging of the pelvis including uterus, ovaries, adnexal regions, and pelvic cul-de-sac.  It was necessary to proceed with endovaginal exam following the transabdominal exam to visualize the ovaries. Color and duplex Doppler ultrasound was utilized to evaluate blood flow to the ovaries.  COMPARISON:  None.  FINDINGS: Uterus  Measurements: 9.0 x 3.9 x 5.3 cm. Nabothian cysts are identified.  Endometrium  Thickness: 1.4 mm. Minimal amount of fluid is noted within the endometrial canal.  Right ovary  Measurements: 2.1 x 1.0 x 1.5 cm. Normal appearance/no adnexal mass.  Left ovary  Measurements: 2.8 x 2.2 x 1.6 cm. A 1.3 cm cyst is noted within the left ovary.  Pulsed Doppler evaluation of both ovaries demonstrates normal low-resistance arterial and venous waveforms.  Other findings  No free fluid.  IMPRESSION: Left ovarian cyst.  No acute abnormality is noted.   Electronically Signed   By: Inez Catalina M.D.   On: 12/23/2014 08:54   Ct Abdomen Pelvis W Contrast  12/23/2014   CLINICAL DATA:  DISH evaluation for upper abdominal pain radiating to back. Nausea, vomiting, and diarrhea. History of prior appendectomy. Mildly elevated lipase and white count.  EXAM: CT ABDOMEN AND PELVIS WITH CONTRAST  TECHNIQUE: Multidetector CT imaging of the abdomen and pelvis was performed using the standard protocol following bolus administration of intravenous contrast.  CONTRAST:  162m OMNIPAQUE IOHEXOL 300 MG/ML  SOLN  COMPARISON:  Prior CT from 10/07/2011  FINDINGS: The visualized lung bases are clear. No pleural or pericardial effusion.  The liver demonstrates a normal contrast enhanced appearance. Gallbladder within normal limits. No biliary dilatation. The spleen and adrenal glands are normal.  The pancreas demonstrates a normal CT appearance. No significant peripancreatic inflammatory stranding identified. There are no peripancreatic fluid collections. No evidence for acute pancreatitis at this time.  Kidneys are equal in size  with symmetric enhancement. No nephrolithiasis, hydronephrosis, or focal enhancing renal mass.  Stomach within normal limits. No evidence for bowel obstruction. Appendix not visualized, compatible with history of prior appendectomy. No abnormal wall thickening, mucosal enhancement, or inflammatory changes seen about the bowels.  Bladder within normal limits.  Uterus and ovaries are normal.  No free air or fluid. No pathologically enlarged intra-abdominal pelvic lymph nodes.  No acute osseous abnormality. No worrisome lytic or blastic osseous lesions.  IMPRESSION: 1. No CT evidence for acute intra-abdominal pelvic process. 2. Normal CT appearance of the pancreas. Please note that the pancreas can appear normal on CT in the setting of mild/early acute pancreatitis. 3. Prior appendectomy.   Electronically Signed   By: BJeannine BogaM.D.   On: 12/23/2014 05:00   [place]/ven Flow Abd Pelv Doppler  12/23/2014   CLINICAL DATA:  Pelvic pain  EXAM: TRANSABDOMINAL AND TRANSVAGINAL ULTRASOUND OF PELVIS  DOPPLER ULTRASOUND OF OVARIES  TECHNIQUE: Both transabdominal and transvaginal ultrasound examinations of the pelvis were performed. Transabdominal technique was performed for global imaging of the pelvis including uterus, ovaries, adnexal regions, and pelvic cul-de-sac.  It was necessary to proceed with endovaginal exam  following the transabdominal exam to visualize the ovaries. Color and duplex Doppler ultrasound was utilized to evaluate blood flow to the ovaries.  COMPARISON:  None.  FINDINGS: Uterus  Measurements: 9.0 x 3.9 x 5.3 cm. Nabothian cysts are identified.  Endometrium  Thickness: 1.4 mm. Minimal amount of fluid is noted within the endometrial canal.  Right ovary  Measurements: 2.1 x 1.0 x 1.5 cm. Normal appearance/no adnexal mass.  Left ovary  Measurements: 2.8 x 2.2 x 1.6 cm. A 1.3 cm cyst is noted within the left ovary.  Pulsed Doppler evaluation of both ovaries demonstrates normal low-resistance  arterial and venous waveforms.  Other findings  No free fluid.  IMPRESSION: Left ovarian cyst.  No acute abnormality is noted.   Electronically Signed   By: Inez Catalina M.D.   On: 12/23/2014 08:54   US Abdomen Limited Ruq  12/23/2014   CLINICAL DATA:  Right upper quadrant pain.  EXAM: US ABDOMEN LIMITED - RIGHT UPPER QUADRANT  COMPARISON:  None.  FINDINGS: Gallbladder:  Tiny stones and mild sludge demonstrated in the dependent portion of the gallbladder. No gallbladder wall thickening or edema. Murphy's sign is negative.  Common bile duct:  Diameter: 3 mm, normal. Portions of the bowel ducts are obscured by overlying bowel gas.  Liver:  No focal lesion identified. Within normal limits in parenchymal echogenicity.  IMPRESSION: Cholelithiasis with tiny stones and sludge demonstrated in the gallbladder. No additional signs of cholecystitis.   Electronically Signed   By: Lucienne Capers M.D.   On: 12/23/2014 01:36    Anti-infectives: Anti-infectives    Start     Dose/Rate Route Frequency Ordered Stop   12/23/14 1000  cefoTEtan (CEFOTAN) 1 g in dextrose 5 % 50 mL IVPB     1 g100 mL/hr over 30 Minutes Intravenous Every 12 hours 12/23/14 0758     12/23/14 1000  ciprofloxacin (CIPRO) IVPB 400 mg     400 mg200 mL/hr over 60 Minutes Intravenous  Once 12/23/14 0947 12/23/14 1219      Assessment/Plan: Persistent right upper quadrant abdominal pain consistent with ongoing biliary colic or early cholecystitis. Ultrasound documents multiple gallstones. She had transiently elevated lipase that has returned to normal consistent with possible passed common bile duct stone. No other cause for abdominal pain identified with extensive workup. She has ongoing significant pain that is not tolerable and I have recommended proceeding with laparoscopic cholecystectomy with cholangiogram in an effort to relieve her symptoms. She is in agreement and anxious to have the surgery to relieve her discomfort. I discussed the  procedure in detail.  We discussed the risks and benefits of a laparoscopic cholecystectomy and possible cholangiogram including, but not limited to bleeding, infection, injury to surrounding structures such as the intestine or liver, bile leak, retained gallstones, need to convert to an open procedure, prolonged diarrhea, blood clots such as  DVT, common bile duct injury, anesthesia risks, and possible need for additional procedures.  The likelihood of improvement in symptoms and return to the patient's normal status is good. We discussed the typical post-operative recovery course.     LOS: 2 days    Cory Kitt T 12/24/2014

## 2014-12-24 NOTE — Anesthesia Preprocedure Evaluation (Addendum)
Anesthesia Evaluation  Patient identified by MRN, date of birth, ID band Patient awake    Reviewed: Allergy & Precautions, NPO status , Patient's Chart, lab work & pertinent test results  Airway Mallampati: I  TM Distance: >3 FB Neck ROM: Full    Dental no notable dental hx. (+) Chipped,    Pulmonary former smoker,  breath sounds clear to auscultation  Pulmonary exam normal       Cardiovascular negative cardio ROS  Rhythm:Regular Rate:Normal     Neuro/Psych  Headaches, PSYCHIATRIC DISORDERS Depression Restless legs syndrome    GI/Hepatic Neg liver ROS, Cholelithiasis with Cholecystitis   Endo/Other  Obesity  Renal/GU negative Renal ROS  negative genitourinary   Musculoskeletal negative musculoskeletal ROS (+)   Abdominal (+) + obese,  Abdomen: tender.    Peds  Hematology  (+) anemia ,   Anesthesia Other Findings   Reproductive/Obstetrics negative OB ROS                           Anesthesia Physical Anesthesia Plan  ASA: II  Anesthesia Plan: General   Post-op Pain Management:    Induction: Intravenous and Cricoid pressure planned  Airway Management Planned: Oral ETT  Additional Equipment:   Intra-op Plan:   Post-operative Plan: Extubation in OR  Informed Consent: I have reviewed the patients History and Physical, chart, labs and discussed the procedure including the risks, benefits and alternatives for the proposed anesthesia with the patient or authorized representative who has indicated his/her understanding and acceptance.   Dental advisory given  Plan Discussed with: Anesthesiologist, CRNA and Surgeon  Anesthesia Plan Comments:        Anesthesia Quick Evaluation

## 2014-12-25 DIAGNOSIS — K8000 Calculus of gallbladder with acute cholecystitis without obstruction: Secondary | ICD-10-CM

## 2014-12-25 LAB — COMPREHENSIVE METABOLIC PANEL
ALK PHOS: 84 U/L (ref 39–117)
ALT: 23 U/L (ref 0–35)
AST: 24 U/L (ref 0–37)
Albumin: 3.5 g/dL (ref 3.5–5.2)
Anion gap: 6 (ref 5–15)
BUN: 5 mg/dL — ABNORMAL LOW (ref 6–23)
CO2: 26 mmol/L (ref 19–32)
Calcium: 8.6 mg/dL (ref 8.4–10.5)
Chloride: 105 mmol/L (ref 96–112)
Creatinine, Ser: 0.62 mg/dL (ref 0.50–1.10)
GFR calc Af Amer: >90 mL/min (ref >90)
GFR calc non Af Amer: >90 mL/min (ref >90)
Glucose, Bld: 142 mg/dL — ABNORMAL HIGH (ref 70–99)
POTASSIUM: 4.6 mmol/L (ref 3.5–5.1)
Sodium: 137 mmol/L (ref 135–145)
Total Bilirubin: 0.7 mg/dL (ref 0.3–1.2)
Total Protein: 6.6 g/dL (ref 6.0–8.3)

## 2014-12-25 LAB — CBC
HEMATOCRIT: 33.8 % — AB (ref 36.0–46.0)
Hemoglobin: 10.4 g/dL — ABNORMAL LOW (ref 12.0–15.0)
MCH: 19.2 pg — ABNORMAL LOW (ref 26.0–34.0)
MCHC: 30.8 g/dL (ref 30.0–36.0)
MCV: 62.5 fL — ABNORMAL LOW (ref 78.0–100.0)
Platelets: 253 10*3/uL (ref 150–400)
RBC: 5.41 MIL/uL — ABNORMAL HIGH (ref 3.87–5.11)
RDW: 15.5 % (ref 11.5–15.5)
WBC: 6.8 10*3/uL (ref 4.0–10.5)

## 2014-12-25 NOTE — Discharge Summary (Signed)
DISCHARGE SUMMARY  Crystal Myers  MR#: [id number]  DOB:Jan 25, 1981  Date of Admission: 12/22/2014 Date of Discharge: 12/25/2014  Attending Physician:MCCLUNG,JEFFREY T  Patient's MVH:QIONGEX, Crystal Batten, MD  Consults:  Gen Surgery   Disposition: D/C home   Follow-up Appts:     Follow-up Information    Follow up with HOXWORTH,BENJAMIN T, MD. Schedule an appointment as soon as possible for a visit in 2 weeks.   Specialty:  General Surgery   Contact information:   [address] [place] [address] [phone]      Discharge Diagnoses: Acute gallstone pancreatitis Acute cholecystitis and cholelithiasis   Initial presentation: 34 year old female with history of asthma, migraine headaches, and depression who presented to [facility] ED with persistent right upper abdominal pain radiating to the back and associated with nausea, vomiting and diarrhea for a few days prior to this admission. Pt reported pain aggravated with food.   Hospital Course: On admission, ultrasound documented multiple gallstones. Pt had transient elevation in lipase which subsequently normalized consistent with possible possible passed common bile duct stone. LFT's were WNL since admission. Since patient had ongoing pain Gen Surgery recommended proceeding with laparoscopic cholecystectomy with cholangiogram in an effort to relieve her symptoms.  This was accomplished on 1/30.  The following day the pt was feeling much better.  She was tolerating a heart healthy diet w/o difficulty.  She was cleared for d/c by Gen Surgery.  Since there were no other outstanding medical issues she was d/c home by the medical team.        Medication List    STOP taking these medications        predniSONE 20 MG tablet  Commonly known as:  DELTASONE      TAKE these medications        albuterol 108 (90 BASE) MCG/ACT inhaler  Commonly known as:  PROVENTIL HFA;VENTOLIN HFA  Inhale 2 puffs into the lungs every 4 (four) hours  as needed for wheezing or shortness of breath.     EPIPEN 2-PAK 0.3 mg/0.3 mL Soaj injection  Generic drug:  EPINEPHrine  Inject 1 Dose as directed as needed.     famotidine 20 MG tablet  Commonly known as:  PEPCID  Take 1 tablet (20 mg total) by mouth 2 (two) times daily.     FLUoxetine 20 MG capsule  Commonly known as:  PROZAC  Take 40 mg by mouth at bedtime.     gabapentin 300 MG capsule  Commonly known as:  NEURONTIN  Take 300 mg by mouth at bedtime.     HYDROcodone-acetaminophen 5-325 MG per tablet  Commonly known as:  NORCO/VICODIN  Take 2 tablets by mouth every 4 (four) hours as needed for moderate pain or severe pain.     ibuprofen 600 MG tablet  Commonly known as:  ADVIL,MOTRIN  Take 1 tablet (600 mg total) by mouth every 6 (six) hours as needed.     loratadine 10 MG tablet  Commonly known as:  CLARITIN  Take 1 tablet (10 mg total) by mouth daily.     montelukast 10 MG tablet  Commonly known as:  SINGULAIR  Take 10 mg by mouth at bedtime.     omeprazole 40 MG capsule  Commonly known as:  PRILOSEC  Take 40 mg by mouth daily.     ondansetron 4 MG disintegrating tablet  Commonly known as:  ZOFRAN ODT  Take 1 tablet (4 mg total) by mouth every 8 (eight) hours  as needed for nausea or vomiting.     PATADAY 0.2 % Soln  Generic drug:  Olopatadine HCl  Place 1 drop into both eyes as needed (allergies).     PATANASE 0.6 % Soln  Generic drug:  Olopatadine HCl  Place 1 spray into both nostrils at bedtime.     SEASONIQUE 0.15-0.03 &0.01 MG tablet  Generic drug:  Levonorgestrel-Ethinyl Estradiol  Take 1 tablet by mouth daily.       Day of Discharge BP 145/86 mmHg  Pulse 99  Temp(Src) 98 F (36.7 C) (Oral)  Resp 16  SpO2 99%  LMP   Physical Exam: General: No acute respiratory distress Lungs: Clear to auscultation bilaterally without wheezes or crackles Cardiovascular: Regular rate and rhythm without murmur gallop or rub normal S1 and S2 Abdomen:  Nontender, nondistended, soft, bowel sounds positive, no rebound, no ascites, no appreciable mass - wounds glued shut w/o d/c or erythema  Extremities: No significant cyanosis, clubbing, or edema bilateral lower extremities  Basic Metabolic Panel:  Recent Labs Lab 12/22/14 2306 12/23/14 0959 12/24/14 0513 12/25/14 0504  NA 139 138 140 137  K 4.1 4.4 4.3 4.6  CL 107 105 107 105  CO2 22 24 25 26   GLUCOSE 120* 99 88 142*  BUN 10 10 7  5*  CREATININE 0.69 0.60 0.65 0.62  CALCIUM 9.2 8.6 8.4 8.6    Liver Function Tests:  Recent Labs Lab 12/22/14 2331 12/23/14 0959 12/24/14 0513 12/25/14 0504  AST 23 18 15 24   ALT 13 13 12 23   ALKPHOS 64 58 51 84  BILITOT 0.5 0.6 0.8 0.7  PROT 7.4 7.2 6.0 6.6  ALBUMIN 4.0 3.8 3.2* 3.5    Recent Labs Lab 12/22/14 2331 12/23/14 0959 12/24/14 0513  LIPASE 73* 186* 47   CBC:  Recent Labs Lab 12/22/14 2306 12/23/14 0959 12/24/14 0513 12/25/14 0504  WBC 11.9* 10.4 8.9 6.8  NEUTROABS 5.9 7.1  --   --   HGB 12.2 10.8* 9.9* 10.4*  HCT 38.8 35.0* 32.3* 33.8*  MCV 62.0* 63.1* 63.1* 62.5*  PLT 309 282 255 253    Recent Results (from the past 240 hour(s))  Surgical pcr screen     Status: None   Collection Time: 12/24/14  2:24 PM  Result Value Ref Range Status   MRSA, PCR NEGATIVE NEGATIVE Final   Staphylococcus aureus NEGATIVE NEGATIVE Final    Comment:        The Xpert SA Assay (FDA approved for NASAL specimens in patients over 35 years of age), is one component of a comprehensive surveillance program.  Test performance has been validated by [facility] for patients greater than or equal to 35 year old. It is not intended to diagnose infection nor to guide or monitor treatment.       Time spent in discharge (includes decision making & examination of pt): 30 minutes  12/25/2014, 2:52 PM   Lonia BloodJeffrey T. McClung, MD [facility] Office  [phone] Pager [phone]  On-Call/Text Page:      [email or web address]       password [email or web address]

## 2014-12-25 NOTE — Discharge Instructions (Signed)
To Whom It May Concern: Crystal Myers was hospitalized January 28 through 12/25/2014 for a surgical procedure. Please contact our office if further details are required.   Jaclynn GuarneriBen Leeum Sankey MD    [facility] ______[facility] LAPAROSCOPIC SURGERY: POST OP INSTRUCTIONS Always review your discharge instruction sheet given to you by the facility where your surgery was performed. IF YOU HAVE DISABILITY OR FAMILY LEAVE FORMS, YOU MUST BRING THEM TO THE OFFICE FOR PROCESSING.   DO NOT GIVE THEM TO YOUR DOCTOR.  1. A prescription for pain medication may be given to you upon discharge.  Take your pain medication as prescribed, if needed.  If narcotic pain medicine is not needed, then you may take acetaminophen (Tylenol) or ibuprofen (Advil) as needed. 2. Take your usually prescribed medications unless otherwise directed. 3. If you need a refill on your pain medication, please contact your pharmacy.  They will contact our office to request authorization. Prescriptions will not be filled after 5pm or on week-ends. 4. You should follow a light diet the first few days after arrival home, such as soup and crackers, etc.  Be sure to include lots of fluids daily. 5. Most patients will experience some swelling and bruising in the area of the incisions.  Ice packs will help.  Swelling and bruising can take several days to resolve.  6. It is common to experience some constipation if taking pain medication after surgery.  Increasing fluid intake and taking a stool softener (such as Colace) will usually help or prevent this problem from occurring.  A mild laxative (Milk of Magnesia or Miralax) should be taken according to package instructions if there are no bowel movements after 48 hours. 7. Unless discharge instructions indicate otherwise, you may remove your bandages 24-48 hours after surgery, and you may shower at that time.  You may have steri-strips (small skin tapes) in place directly over the incision.   These strips should be left on the skin for 7-10 days.  If your surgeon used skin glue on the incision, you may shower in 24 hours.  The glue will flake off over the next 2-3 weeks.  Any sutures or staples will be removed at the office during your follow-up visit. 8. ACTIVITIES:  You may resume regular (light) daily activities beginning the next day--such as daily self-care, walking, climbing stairs--gradually increasing activities as tolerated.  You may have sexual intercourse when it is comfortable.  Refrain from any heavy lifting or straining until approved by your doctor. a. You may drive when you are no longer taking prescription pain medication, you can comfortably wear a seatbelt, and you can safely maneuver your car and apply brakes. b. RETURN TO WORK:  __________________________________________________________ 9. You should see your doctor in the office for a follow-up appointment approximately 2-3 weeks after your surgery.  Make sure that you call for this appointment within a day or two after you arrive home to insure a convenient appointment time. 10. OTHER INSTRUCTIONS: __________________________________________________________________________________________________________________________ __________________________________________________________________________________________________________________________ WHEN TO CALL YOUR DOCTOR: 1. Fever over 101.0 2. Inability to urinate 3. Continued bleeding from incision. 4. Increased pain, redness, or drainage from the incision. 5. Increasing abdominal pain  The clinic staff is available to answer your questions during regular business hours.  Please dont hesitate to call and ask to speak to one of the nurses for clinical concerns.  If you have a medical emergency, go to the nearest emergency room or call 911.  A surgeon from [facility] is always  on call at the hospital. [address], [address], [place], [place]  [address]  ? [address] [address], [place], [place]   [address] [phone] ? [phone] ? FAX [phone] Web site: [email or web address]

## 2014-12-25 NOTE — Progress Notes (Signed)
Patient ID: Crystal Orie FishermanM Taylor, female   DOB: 1981-11-12, 34 y.o.   MRN: [id number] 1 Day Post-Op  Subjective: Incisional soreness but preoperative pain seems relieved. Denies nausea or other complaints.  Objective: Vital signs in last 24 hours: Temp:  [98 F (36.7 C)-98.7 F (37.1 C)] 98 F (36.7 C) (01/31 0603) Pulse Rate:  [76-111] 86 (01/31 0603) Resp:  [11-19] 18 (01/31 0603) BP: (115-138)/(68-90) 118/77 mmHg (01/31 0603) SpO2:  [94 %-100 %] 97 % (01/31 0603) Last BM Date: 12/24/14  Intake/Output from previous day: 01/30 0701 - 01/31 0700 In: 1300 [I.V.:1300] Out: 10 [Blood:10] Intake/Output this shift: Total I/O In: 240 [P.O.:240] Out: -   General appearance: alert, cooperative and no distress GI: mild appropriate incisional tenderness Incision/Wound: clean and dry  Lab Results:   Recent Labs  12/24/14 0513 12/25/14 0504  WBC 8.9 6.8  HGB 9.9* 10.4*  HCT 32.3* 33.8*  PLT 255 253   BMET  Recent Labs  12/24/14 0513 12/25/14 0504  NA 140 137  K 4.3 4.6  CL 107 105  CO2 25 26  GLUCOSE 88 142*  BUN 7 5*  CREATININE 0.65 0.62  CALCIUM 8.4 8.6     Studies/Results: Dg Cholangiogram Operative  12/24/2014   CLINICAL DATA:  Cholecystectomy.  EXAM: INTRAOPERATIVE CHOLANGIOGRAM  TECHNIQUE: Cholangiographic images from the C-arm fluoroscopic device were submitted for interpretation post-operatively. Please see the procedural report for the amount of contrast and the fluoroscopy time utilized.  COMPARISON:  CT abdomen and pelvis 12/23/2014.  FINDINGS: Images demonstrate a rounded lucency at the junction of the cystic and common hepatic ducts. There is no biliary ductal dilatation. Contrast flows into the duodenum. No leakage of contrast is seen.  IMPRESSION: Rounded lucency at the junction of the cystic and common hepatic ducts may be an air bubble as no biliary ductal dilatation is identified. Intraductal stone is also possible.   Electronically Signed   By: Drusilla Kannerhomas   Dalessio M.D.   On: 12/24/2014 18:43   Nm Hepatobiliary Liver Func  12/24/2014   CLINICAL DATA:  Acute onset of generalized abdominal pain, nausea and vomiting. Initial encounter.  EXAM: NUCLEAR MEDICINE HEPATOBILIARY IMAGING  TECHNIQUE: Sequential images of the abdomen were obtained out to 60 minutes following intravenous administration of radiopharmaceutical.  RADIOPHARMACEUTICALS:  5.4 Millicurie Tc-8185m Choletec  COMPARISON:  Right upper quadrant abdominal ultrasound performed 12/23/2014  FINDINGS: There is normal accumulation of activity within the liver, and activity extends into the intrahepatic biliary tree. Activity progresses into the small bowel by 10 minutes, and progresses throughout the small bowel. The gallbladder is visualized starting at 50 minutes, and is grossly unremarkable in appearance. There is no evidence for obstruction or cholecystitis.  IMPRESSION: Normal HIDA scan.   Electronically Signed   By: Roanna RaiderJeffery  Chang M.D.   On: 12/24/2014 08:32    Anti-infectives: Anti-infectives    Start     Dose/Rate Route Frequency Ordered Stop   12/23/14 1000  cefoTEtan (CEFOTAN) 1 g in dextrose 5 % 50 mL IVPB  Status:  Discontinued     1 g100 mL/hr over 30 Minutes Intravenous Every 12 hours 12/23/14 0758 12/24/14 2021   12/23/14 1000  ciprofloxacin (CIPRO) IVPB 400 mg     400 mg200 mL/hr over 60 Minutes Intravenous  Once 12/23/14 0947 12/23/14 1219      Assessment/Plan: s/p Procedure(s): LAPAROSCOPIC CHOLECYSTECTOMY WITH INTRAOPERATIVE CHOLANGIOGRAM Doing well without apparent complication. Okay for discharge.    LOS: 3 days    Read Bonelli T 12/25/2014

## 2014-12-25 NOTE — Progress Notes (Signed)
Pt discharged to home. DC instructions given. No concerns voiced. Left unit in wheelchair pushed by this rn accompanied by friend. Left in good condition. Vwilliams,rn.

## 2014-12-26 ENCOUNTER — Encounter (HOSPITAL_COMMUNITY): Payer: Self-pay | Admitting: General Surgery

## 2015-03-15 ENCOUNTER — Encounter (HOSPITAL_COMMUNITY): Payer: Self-pay | Admitting: Emergency Medicine

## 2015-03-15 ENCOUNTER — Emergency Department (HOSPITAL_COMMUNITY)
Admission: EM | Admit: 2015-03-15 | Discharge: 2015-03-16 | Disposition: A | Discharge status: Home / Self Care | Payer: Medicaid Other | Attending: Emergency Medicine | Admitting: Emergency Medicine

## 2015-03-15 DIAGNOSIS — Z87891 Personal history of nicotine dependence: Secondary | ICD-10-CM | POA: Insufficient documentation

## 2015-03-15 DIAGNOSIS — R110 Nausea: Secondary | ICD-10-CM | POA: Diagnosis not present

## 2015-03-15 DIAGNOSIS — Z79899 Other long term (current) drug therapy: Secondary | ICD-10-CM | POA: Diagnosis not present

## 2015-03-15 DIAGNOSIS — R140 Abdominal distension (gaseous): Secondary | ICD-10-CM | POA: Insufficient documentation

## 2015-03-15 DIAGNOSIS — Z9049 Acquired absence of other specified parts of digestive tract: Secondary | ICD-10-CM | POA: Insufficient documentation

## 2015-03-15 DIAGNOSIS — Z792 Long term (current) use of antibiotics: Secondary | ICD-10-CM | POA: Diagnosis not present

## 2015-03-15 DIAGNOSIS — Z7951 Long term (current) use of inhaled steroids: Secondary | ICD-10-CM | POA: Diagnosis not present

## 2015-03-15 DIAGNOSIS — Z8619 Personal history of other infectious and parasitic diseases: Secondary | ICD-10-CM | POA: Insufficient documentation

## 2015-03-15 DIAGNOSIS — Z880 Allergy status to penicillin: Secondary | ICD-10-CM | POA: Diagnosis not present

## 2015-03-15 DIAGNOSIS — Z3202 Encounter for pregnancy test, result negative: Secondary | ICD-10-CM | POA: Diagnosis not present

## 2015-03-15 DIAGNOSIS — R1013 Epigastric pain: Secondary | ICD-10-CM | POA: Diagnosis present

## 2015-03-15 DIAGNOSIS — Z9851 Tubal ligation status: Secondary | ICD-10-CM | POA: Insufficient documentation

## 2015-03-15 DIAGNOSIS — R109 Unspecified abdominal pain: Secondary | ICD-10-CM

## 2015-03-15 DIAGNOSIS — Z862 Personal history of diseases of the blood and blood-forming organs and certain disorders involving the immune mechanism: Secondary | ICD-10-CM | POA: Diagnosis not present

## 2015-03-15 DIAGNOSIS — Z9089 Acquired absence of other organs: Secondary | ICD-10-CM | POA: Diagnosis not present

## 2015-03-15 DIAGNOSIS — N390 Urinary tract infection, site not specified: Secondary | ICD-10-CM | POA: Diagnosis not present

## 2015-03-15 NOTE — ED Notes (Signed)
Pt c/o midline abd pain and back pain on/off since having gall bladder removed in January. Pt is currently being treated for UTI.

## 2015-03-16 ENCOUNTER — Emergency Department (HOSPITAL_COMMUNITY): Payer: Medicaid Other

## 2015-03-16 LAB — CBC WITH DIFFERENTIAL/PLATELET
BASOS ABS: 0 10*3/uL (ref 0.0–0.1)
Basophils Relative: 0 % (ref 0–1)
EOS ABS: 0.3 10*3/uL (ref 0.0–0.7)
Eosinophils Relative: 4 % (ref 0–5)
HEMATOCRIT: 36.1 % (ref 36.0–46.0)
Hemoglobin: 11.1 g/dL — ABNORMAL LOW (ref 12.0–15.0)
LYMPHS ABS: 3.2 10*3/uL (ref 0.7–4.0)
Lymphocytes Relative: 40 % (ref 12–46)
MCH: 19.2 pg — AB (ref 26.0–34.0)
MCHC: 30.7 g/dL (ref 30.0–36.0)
MCV: 62.5 fL — AB (ref 78.0–100.0)
MONOS PCT: 6 % (ref 3–12)
Monocytes Absolute: 0.5 10*3/uL (ref 0.1–1.0)
Neutro Abs: 4 10*3/uL (ref 1.7–7.7)
Neutrophils Relative %: 50 % (ref 43–77)
Platelets: 282 10*3/uL (ref 150–400)
RBC: 5.78 MIL/uL — ABNORMAL HIGH (ref 3.87–5.11)
RDW: 15.4 % (ref 11.5–15.5)
WBC: 8 10*3/uL (ref 4.0–10.5)

## 2015-03-16 LAB — COMPREHENSIVE METABOLIC PANEL
ALBUMIN: 4.2 g/dL (ref 3.5–5.2)
ALT: 14 U/L (ref 0–35)
AST: 17 U/L (ref 0–37)
Alkaline Phosphatase: 63 U/L (ref 39–117)
Anion gap: 7 (ref 5–15)
BUN: 11 mg/dL (ref 6–23)
CO2: 24 mmol/L (ref 19–32)
Calcium: 8.9 mg/dL (ref 8.4–10.5)
Chloride: 107 mmol/L (ref 96–112)
Creatinine, Ser: 0.8 mg/dL (ref 0.50–1.10)
GFR calc Af Amer: >90 mL/min (ref >90)
Glucose, Bld: 105 mg/dL — ABNORMAL HIGH (ref 70–99)
Potassium: 4 mmol/L (ref 3.5–5.1)
SODIUM: 138 mmol/L (ref 135–145)
TOTAL PROTEIN: 7.7 g/dL (ref 6.0–8.3)
Total Bilirubin: 0.5 mg/dL (ref 0.3–1.2)

## 2015-03-16 LAB — URINALYSIS, ROUTINE W REFLEX MICROSCOPIC
Bilirubin Urine: NEGATIVE
Glucose, UA: NEGATIVE mg/dL
Hgb urine dipstick: NEGATIVE
Ketones, ur: NEGATIVE mg/dL
NITRITE: NEGATIVE
Protein, ur: NEGATIVE mg/dL
Specific Gravity, Urine: 1.023 (ref 1.005–1.030)
UROBILINOGEN UA: 1 mg/dL (ref 0.0–1.0)
pH: 7 (ref 5.0–8.0)

## 2015-03-16 LAB — URINE MICROSCOPIC-ADD ON

## 2015-03-16 LAB — LIPASE, BLOOD: Lipase: 29 U/L (ref 11–59)

## 2015-03-16 LAB — POC URINE PREG, ED: Preg Test, Ur: NEGATIVE

## 2015-03-16 MED ORDER — HYDROMORPHONE HCL 1 MG/ML IJ SOLN
1.0000 mg | Freq: Once | INTRAMUSCULAR | Status: AC
  Administered 2015-03-16: 1 mg via INTRAVENOUS
  Filled 2015-03-16: qty 1

## 2015-03-16 MED ORDER — DEXTROSE 5 % IV SOLN
1.0000 g | INTRAVENOUS | Status: DC
  Administered 2015-03-16: 1 g via INTRAVENOUS
  Filled 2015-03-16: qty 10

## 2015-03-16 MED ORDER — MORPHINE SULFATE 4 MG/ML IJ SOLN
4.0000 mg | Freq: Once | INTRAMUSCULAR | Status: AC
  Administered 2015-03-16: 4 mg via INTRAVENOUS
  Filled 2015-03-16: qty 1

## 2015-03-16 MED ORDER — FAMOTIDINE IN NACL 20-0.9 MG/50ML-% IV SOLN
20.0000 mg | Freq: Once | INTRAVENOUS | Status: AC
  Administered 2015-03-16: 20 mg via INTRAVENOUS
  Filled 2015-03-16: qty 50

## 2015-03-16 MED ORDER — ONDANSETRON HCL 4 MG/2ML IJ SOLN
4.0000 mg | Freq: Once | INTRAMUSCULAR | Status: AC
  Administered 2015-03-16: 4 mg via INTRAVENOUS
  Filled 2015-03-16: qty 2

## 2015-03-16 MED ORDER — HYDROCODONE-ACETAMINOPHEN 5-325 MG PO TABS: 1.0000 | ORAL_TABLET | ORAL | Status: DC | PRN

## 2015-03-16 MED ORDER — SODIUM CHLORIDE 0.9 % IV BOLUS (SEPSIS)
1000.0000 mL | Freq: Once | INTRAVENOUS | Status: AC
  Administered 2015-03-16: 1000 mL via INTRAVENOUS

## 2015-03-16 MED ORDER — CEPHALEXIN 500 MG PO CAPS: 500.0000 mg | ORAL_CAPSULE | Freq: Three times a day (TID) | ORAL | Status: DC

## 2015-03-16 NOTE — ED Provider Notes (Signed)
CSN: [id number]     Arrival date & time 03/15/15  2326 History   First MD Initiated Contact with Patient 03/16/15 0053     Chief Complaint  Patient presents with  . Abdominal Pain     (Consider location/radiation/quality/duration/timing/severity/associated sxs/prior Treatment) Patient is a 34 y.o. female presenting with abdominal pain. The history is provided by the patient and medical records.  Abdominal Pain Associated symptoms: nausea     This is a 34 year old female with past medical history significant for anemia, presenting to the ED for abdominal pain. Patient states she had her gallbladder removed in late January and has been having abdominal issues since this time. She states she continues having epigastric pain, worse after eating independent of type of food. She states she also has intermittent nausea but denies recent vomiting or diarrhea. States her abdomen does feel somewhat distended and has a "bloating sensation". Her bowel movements have been regular, freely passing flatus without difficulty. Patient is also currently being treated for UTI. She states since having her tubal ligation she has had issues with recurrent UTIs. She has not seen a urologist about this. States she has urinary frequency and incomplete voiding.  She denies dysuria or hematuria.  No fever, chills, or flank pain. No pelvic pain or vaginal issues. Patient had follow-up with general surgery scheduled but missed her appt.  Past Medical History  Diagnosis Date  . Anemia   . Chlamydia    Past Surgical History  Procedure Laterality Date  . Cesarean section    . Appendectomy    . Tonsillectomy    . Cesarean section      x4  . Tubal ligation    . Cholecystectomy N/A 12/24/2014    Procedure: LAPAROSCOPIC CHOLECYSTECTOMY WITH INTRAOPERATIVE CHOLANGIOGRAM;  Surgeon: Glenna FellowsBenjamin Hoxworth, MD;  Location: [facility];  Service: General;  Laterality: N/A;   Family History  Problem Relation Age of Onset  . Diabetes  Mother   . Hypertension Mother   . CAD Mother    History  Substance Use Topics  . Smoking status: Former Smoker -- 0.50 packs/day    Types: Cigarettes    Quit date: 04/25/2014  . Smokeless tobacco: Never Used  . Alcohol Use: No     Comment: former   OB History    Gravida Para Term Preterm AB TAB SAB Ectopic Multiple Living   5 5 3 2      3      Review of Systems  Gastrointestinal: Positive for nausea and abdominal pain.  All other systems reviewed and are negative.     Allergies  Augmentin; Percocet; and Penicillins  Home Medications   Prior to Admission medications   Medication Sig Start Date End Date Taking? Authorizing Provider  albuterol (PROVENTIL HFA;VENTOLIN HFA) 108 (90 BASE) MCG/ACT inhaler Inhale 2 puffs into the lungs every 4 (four) hours as needed for wheezing or shortness of breath. 07/25/14  Yes Shon Batonourtney F Horton, MD  ciprofloxacin (CIPRO) 500 MG tablet Take 500 mg by mouth 2 (two) times daily.   Yes Historical Provider, MD  EPINEPHrine (EPIPEN 2-PAK) 0.3 mg/0.3 mL IJ SOAJ injection Inject 1 Dose as directed as needed. 08/19/14  Yes Historical Provider, MD  FLUoxetine (PROZAC) 20 MG capsule Take 40 mg by mouth at bedtime.    Yes Historical Provider, MD  levocetirizine (XYZAL) 5 MG tablet Take 5 mg by mouth every evening.   Yes Historical Provider, MD  Levonorgestrel-Ethinyl Estradiol (SEASONIQUE) 0.15-0.03 &0.01 MG tablet Take 1 tablet by  mouth daily.     Yes Historical Provider, MD  mometasone (NASONEX) 50 MCG/ACT nasal spray Place 1-2 sprays into the nose daily.   Yes Historical Provider, MD  montelukast (SINGULAIR) 10 MG tablet Take 10 mg by mouth at bedtime.   Yes Historical Provider, MD  Olopatadine HCl (PATADAY) 0.2 % SOLN Place 2 drops into both eyes as needed (allergies).  08/19/14  Yes Historical Provider, MD  omeprazole (PRILOSEC) 40 MG capsule Take 40 mg by mouth daily. 08/19/14  Yes Historical Provider, MD  rOPINIRole (REQUIP) 1 MG tablet Take 1 mg by  mouth at bedtime.   Yes Historical Provider, MD  ziprasidone (GEODON) 40 MG capsule Take 40 mg by mouth daily.   Yes Historical Provider, MD  famotidine (PEPCID) 20 MG tablet Take 1 tablet (20 mg total) by mouth 2 (two) times daily. Patient not taking: Reported on 12/22/2014 12/14/14   Rhea Palumbo, MD  HYDROcodone-acetaminophen (NORCO/VICODIN) 5-325 MG per tablet Take 2 tablets by mouth every 4 (four) hours as needed for moderate pain or severe pain. Patient not taking: Reported on 12/14/2014 10/17/14   Emilia Beck, PA-C  ibuprofen (ADVIL,MOTRIN) 600 MG tablet Take 1 tablet (600 mg total) by mouth every 6 (six) hours as needed. Patient not taking: Reported on 12/14/2014 06/07/14   Francee Piccolo, PA-C  loratadine (CLARITIN) 10 MG tablet Take 1 tablet (10 mg total) by mouth daily. Patient not taking: Reported on 03/16/2015 07/25/14   Shon Baton, MD  ondansetron (ZOFRAN ODT) 4 MG disintegrating tablet Take 1 tablet (4 mg total) by mouth every 8 (eight) hours as needed for nausea or vomiting. Patient not taking: Reported on 12/14/2014 10/17/14   Kaitlyn Szekalski, PA-C   BP 126/66 mmHg  Pulse 80  Temp(Src) 98.2 F (36.8 C) (Oral)  Resp 16  Ht  (1.753 m)  Wt 261 lb (118.389 kg)  BMI 38.53 kg/m2  SpO2 100%   Physical Exam  Constitutional: She is oriented to person, place, and time. She appears well-developed and well-nourished.  HENT:  Head: Normocephalic and atraumatic.  Mouth/Throat: Oropharynx is clear and moist.  Eyes: Conjunctivae and EOM are normal. Pupils are equal, round, and reactive to light.  Neck: Normal range of motion.  Cardiovascular: Normal rate, regular rhythm and normal heart sounds.   Pulmonary/Chest: Effort normal and breath sounds normal. No respiratory distress. She has no wheezes.  Abdominal: Soft. Bowel sounds are normal. There is tenderness in the periumbilical area and left upper quadrant. There is no rigidity, no guarding, no CVA tenderness, no  tenderness at McBurney's point and negative Murphy's sign.  Abdomen soft, nondistended, tenderness of left upper quadrant and periumbilical region without peritoneal signs  Musculoskeletal: Normal range of motion.  Neurological: She is alert and oriented to person, place, and time.  Skin: Skin is warm and dry.  Psychiatric: She has a normal mood and affect.  Nursing note and vitals reviewed.   ED Course  Procedures (including critical care time) Labs Review Labs Reviewed  CBC WITH DIFFERENTIAL/PLATELET - Abnormal; Notable for the following:    RBC 5.78 (*)    Hemoglobin 11.1 (*)    MCV 62.5 (*)    MCH 19.2 (*)    All other components within normal limits  COMPREHENSIVE METABOLIC PANEL - Abnormal; Notable for the following:    Glucose, Bld 105 (*)    All other components within normal limits  URINALYSIS, ROUTINE W REFLEX MICROSCOPIC - Abnormal; Notable for the following:    APPearance  CLOUDY (*)    Leukocytes, UA SMALL (*)    All other components within normal limits  URINE MICROSCOPIC-ADD ON - Abnormal; Notable for the following:    Squamous Epithelial / LPF MANY (*)    Bacteria, UA MANY (*)    All other components within normal limits  URINE CULTURE  LIPASE, BLOOD  POC URINE PREG, ED    Imaging Review Dg Abd Acute W/chest  03/16/2015   CLINICAL DATA:  Abdominal pain and bloating intermittently since cholecystectomy January 2016. There is been worsening over the past few days  EXAM: DG ABDOMEN ACUTE W/ 1V CHEST  COMPARISON:  None.  FINDINGS: There is no evidence of dilated bowel loops or free intraperitoneal air. No concerning intra-abdominal mass effect or calcification. Cholecystectomy. Heart size and mediastinal contours are within normal limits. Both lungs are clear.  IMPRESSION: Negative abdominal radiographs.  No acute cardiopulmonary disease.   Electronically Signed   By: Marnee Spring M.D.   On: 03/16/2015 02:06     EKG Interpretation None      MDM   Final  diagnoses:  Bloating  Abdominal pain  UTI (lower urinary tract infection)   34 year old female with abdominal pain, bloating, and urinary frequency. She has been having ongoing abdominal issues since cholecystectomy in January.  On exam, patient is afebrile and nontoxic in appearance. She has tenderness of her left upper quadrant and periumbilical region without rebound or guarding. Exam is non-peritoneal. Labwork as above, no leukocytosis.  LFT's and bili WNL.  UA still appears infectious despite prior to completing 10 days of treatment with Cipro, will send for culture. Acute abdominal series negative for signs of obstruction or free air. Patient was given IV meds here in the ED with improvement of her symptoms.  Given negative workup here, I have low suspicion for acute or surgical pathology at this time. Patient given dose of IV rocephin, will change PO antibiotics to Keflex for UTI pending urine culture. We'll also discharged home with short supply of a medication. Patient to follow-up with her PCP. I've also given her referral to urology for ongoing issues with UTI.  Discussed plan with patient, he/she acknowledged understanding and agreed with plan of care.  Return precautions given for new or worsening symptoms.  Garlon Hatchet, PA-C 03/16/15 0509  Garlon Hatchet, PA-C 03/16/15 0510  Paula Libra, MD 03/16/15 [phone]

## 2015-03-16 NOTE — ED Notes (Signed)
Pt is still taking antibiotics for an existing UTI

## 2015-03-16 NOTE — Discharge Instructions (Signed)
Take the prescribed medication as directed. Follow-up with your primary care physician. You may also wish to follow-up with urology for ongoing issues with UTI. Return to the ED for new or worsening symptoms.

## 2015-03-16 NOTE — ED Notes (Signed)
Pt complains of abdominal pain, currently being treated for UTI

## 2015-03-16 NOTE — ED Notes (Signed)
Pt states the pain is worse after the PA pressed on her belly

## 2015-03-17 LAB — URINE CULTURE: Colony Count: 80000

## 2015-04-25 ENCOUNTER — Encounter (HOSPITAL_COMMUNITY): Payer: Self-pay

## 2015-04-25 ENCOUNTER — Emergency Department (HOSPITAL_COMMUNITY)
Admission: EM | Admit: 2015-04-25 | Discharge: 2015-04-26 | Disposition: A | Discharge status: Home / Self Care | Payer: Medicaid Other | Attending: Emergency Medicine | Admitting: Emergency Medicine

## 2015-04-25 DIAGNOSIS — Z880 Allergy status to penicillin: Secondary | ICD-10-CM | POA: Diagnosis not present

## 2015-04-25 DIAGNOSIS — R1030 Lower abdominal pain, unspecified: Secondary | ICD-10-CM | POA: Diagnosis not present

## 2015-04-25 DIAGNOSIS — Z79899 Other long term (current) drug therapy: Secondary | ICD-10-CM | POA: Insufficient documentation

## 2015-04-25 DIAGNOSIS — M549 Dorsalgia, unspecified: Secondary | ICD-10-CM | POA: Insufficient documentation

## 2015-04-25 DIAGNOSIS — N898 Other specified noninflammatory disorders of vagina: Secondary | ICD-10-CM | POA: Diagnosis not present

## 2015-04-25 DIAGNOSIS — Z9049 Acquired absence of other specified parts of digestive tract: Secondary | ICD-10-CM | POA: Insufficient documentation

## 2015-04-25 DIAGNOSIS — Z862 Personal history of diseases of the blood and blood-forming organs and certain disorders involving the immune mechanism: Secondary | ICD-10-CM | POA: Insufficient documentation

## 2015-04-25 DIAGNOSIS — Z8619 Personal history of other infectious and parasitic diseases: Secondary | ICD-10-CM | POA: Diagnosis not present

## 2015-04-25 DIAGNOSIS — Z3202 Encounter for pregnancy test, result negative: Secondary | ICD-10-CM | POA: Insufficient documentation

## 2015-04-25 DIAGNOSIS — Z87891 Personal history of nicotine dependence: Secondary | ICD-10-CM | POA: Diagnosis not present

## 2015-04-25 DIAGNOSIS — R109 Unspecified abdominal pain: Secondary | ICD-10-CM

## 2015-04-25 NOTE — ED Notes (Addendum)
Pt presents with c/o right side flank pain that started approx 4-5 days ago. Pt reports she is also having vaginal discharge and a foul odor to the discharge. Pt reports she normally has vaginal discharge but the odor is new. Denies any abdominal pain.

## 2015-04-26 ENCOUNTER — Emergency Department (HOSPITAL_COMMUNITY): Payer: Medicaid Other

## 2015-04-26 LAB — URINE MICROSCOPIC-ADD ON

## 2015-04-26 LAB — GC/CHLAMYDIA PROBE AMP (~~LOC~~) NOT AT ARMC
Chlamydia: NEGATIVE
NEISSERIA GONORRHEA: NEGATIVE

## 2015-04-26 LAB — URINALYSIS, ROUTINE W REFLEX MICROSCOPIC
Bilirubin Urine: NEGATIVE
GLUCOSE, UA: NEGATIVE mg/dL
Hgb urine dipstick: NEGATIVE
Ketones, ur: NEGATIVE mg/dL
Nitrite: NEGATIVE
PROTEIN: NEGATIVE mg/dL
Specific Gravity, Urine: 1.021 (ref 1.005–1.030)
Urobilinogen, UA: 0.2 mg/dL (ref 0.0–1.0)
pH: 6 (ref 5.0–8.0)

## 2015-04-26 LAB — RPR: RPR Ser Ql: NONREACTIVE

## 2015-04-26 LAB — WET PREP, GENITAL
Clue Cells Wet Prep HPF POC: NONE SEEN
TRICH WET PREP: NONE SEEN
YEAST WET PREP: NONE SEEN

## 2015-04-26 LAB — POC URINE PREG, ED: Preg Test, Ur: NEGATIVE

## 2015-04-26 LAB — HIV ANTIBODY (ROUTINE TESTING W REFLEX): HIV Screen 4th Generation wRfx: NONREACTIVE

## 2015-04-26 MED ORDER — METHOCARBAMOL 500 MG PO TABS: 500.0000 mg | ORAL_TABLET | Freq: Two times a day (BID) | ORAL | Status: DC

## 2015-04-26 MED ORDER — FENTANYL CITRATE (PF) 100 MCG/2ML IJ SOLN
50.0000 ug | Freq: Once | INTRAMUSCULAR | Status: AC
  Administered 2015-04-26: 50 ug via INTRAMUSCULAR
  Filled 2015-04-26: qty 2

## 2015-04-26 MED ORDER — HYDROCODONE-ACETAMINOPHEN 5-325 MG PO TABS: 1.0000 | ORAL_TABLET | ORAL | Status: DC | PRN

## 2015-04-26 MED ORDER — IBUPROFEN 600 MG PO TABS: 600.0000 mg | ORAL_TABLET | Freq: Four times a day (QID) | ORAL | Status: DC | PRN

## 2015-04-26 NOTE — Discharge Instructions (Signed)
Abdominal Pain Many things can cause belly (abdominal) pain. Most times, the belly pain is not dangerous. Many cases of belly pain can be watched and treated at home. HOME CARE   Do not take medicines that help you go poop (laxatives) unless told to by your doctor.  Only take medicine as told by your doctor.  Eat or drink as told by your doctor. Your doctor will tell you if you should be on a special diet. GET HELP IF:  You do not know what is causing your belly pain.  You have belly pain while you are sick to your stomach (nauseous) or have runny poop (diarrhea).  You have pain while you pee or poop.  Your belly pain wakes you up at night.  You have belly pain that gets worse or better when you eat.  You have belly pain that gets worse when you eat fatty foods.  You have a fever. GET HELP RIGHT AWAY IF:   The pain does not go away within 2 hours.  You keep throwing up (vomiting).  The pain changes and is only in the right or left part of the belly.  You have bloody or tarry looking poop. MAKE SURE YOU:   Understand these instructions.  Will watch your condition.  Will get help right away if you are not doing well or get worse. Document Released: 04/29/2008 Document Revised: 11/16/2013 Document Reviewed: 07/21/2013 [facility] Patient Information 2015 [place], [place]. This information is not intended to replace advice given to you by your health care provider. Make sure you discuss any questions you have with your health care provider. Tonight your lab results are all within normal parameters.  She did not have a urinary tract infection.  You have a pelvic infection.  You have been given a prescription for pain control and a muscle relaxer   [facility] as well as urology

## 2015-04-26 NOTE — ED Notes (Signed)
Patient c/o low right back pain radiating to the pelvic area. Patient also c/o vaginal discharge, states this is normal except that this time there is an odor. Patient states pain is severe. Hx of gallbladder and appendix being removed,

## 2015-04-26 NOTE — ED Notes (Signed)
Patient transported to X-ray 

## 2015-04-26 NOTE — ED Notes (Signed)
Wasted 50mcg (1ml) Fentanyl in sharps after patient was discharged. Patient not listed in pyxis at this time. Witnessed by Caryl AspElvira Lenetta Piche, PharmD.

## 2015-04-26 NOTE — ED Provider Notes (Signed)
CSN: [id number]     Arrival date & time 04/25/15  2320 History   First MD Initiated Contact with Patient 04/26/15 0121     Chief Complaint  Patient presents with  . Vaginal Discharge  . Flank Pain     (Consider location/radiation/quality/duration/timing/severity/associated sxs/prior Treatment) HPI Comments: Patient with 4-5 days of right flank pain radiating to the right lower abdomen, vaginal discharge, foul odor, and low back pain.  Systems been intermittent for a while but more severe in the last 4-5 days.  She has had an appendectomy and cholecystectomy.  She's had a tubal ligation.  She had several emergency room evaluations for this discomfort, without definitive diagnosis.  She has been seen by her primary care physician and is awaiting referral to urology for further evaluation.  She does have a history cervical cancer with a LEEP procedure approximately 2 years ago she had a normal follow-up exam.  Approximate 1 year ago.    Patient is a 34 y.o. female presenting with vaginal discharge and flank pain. The history is provided by the patient.  Vaginal Discharge Quality:  White and thick Severity:  Moderate Onset quality:  Gradual Timing:  Constant Progression:  Worsening Chronicity:  New Relieved by:  Nothing Worsened by:  Nothing tried Ineffective treatments:  None tried Associated symptoms: abdominal pain and dyspareunia   Associated symptoms: no dysuria, no fever, no genital lesions, no nausea, no urinary incontinence, no vaginal itching and no vomiting   Risk factors: gynecological surgery   Risk factors: no immunosuppression, no new sexual partner and no STI exposure   Flank Pain Associated symptoms include abdominal pain. Pertinent negatives include no chest pain, fever, nausea, rash or vomiting.    Past Medical History  Diagnosis Date  . Anemia   . Chlamydia    Past Surgical History  Procedure Laterality Date  . Cesarean section    . Appendectomy    .  Tonsillectomy    . Cesarean section      x4  . Tubal ligation    . Cholecystectomy N/A 12/24/2014    Procedure: LAPAROSCOPIC CHOLECYSTECTOMY WITH INTRAOPERATIVE CHOLANGIOGRAM;  Surgeon: Glenna FellowsBenjamin Hoxworth, MD;  Location: [facility];  Service: General;  Laterality: N/A;   Family History  Problem Relation Age of Onset  . Diabetes Mother   . Hypertension Mother   . CAD Mother    History  Substance Use Topics  . Smoking status: Former Smoker -- 0.50 packs/day    Types: Cigarettes    Quit date: 04/25/2014  . Smokeless tobacco: Never Used  . Alcohol Use: No     Comment: former   OB History    Gravida Para Term Preterm AB TAB SAB Ectopic Multiple Living   5 5 3 2      3      Review of Systems  Constitutional: Negative for fever and activity change.  Respiratory: Negative for shortness of breath.   Cardiovascular: Negative for chest pain and leg swelling.  Gastrointestinal: Positive for abdominal pain. Negative for nausea and vomiting.  Genitourinary: Positive for flank pain, vaginal discharge, pelvic pain and dyspareunia. Negative for bladder incontinence, dysuria, decreased urine volume, vaginal bleeding and vaginal pain.  Musculoskeletal: Positive for back pain.  Skin: Negative for rash.      Allergies  Augmentin; Chantix; Percocet; and Penicillins  Home Medications   Prior to Admission medications   Medication Sig Start Date End Date Taking? Authorizing Provider  albuterol (PROVENTIL HFA;VENTOLIN HFA) 108 (90 BASE) MCG/ACT inhaler Inhale 2  puffs into the lungs every 4 (four) hours as needed for wheezing or shortness of breath. 07/25/14  Yes Shon Baton, MD  cetirizine (ZYRTEC) 10 MG tablet Take 10 mg by mouth daily as needed. 03/24/15  Yes Historical Provider, MD  EPINEPHrine (EPIPEN 2-PAK) 0.3 mg/0.3 mL IJ SOAJ injection Inject 1 Dose as directed as needed. 08/19/14  Yes Historical Provider, MD  FLUoxetine (PROZAC) 20 MG capsule Take 40 mg by mouth at bedtime.    Yes  Historical Provider, MD  Levonorgestrel-Ethinyl Estradiol (SEASONIQUE) 0.15-0.03 &0.01 MG tablet Take 1 tablet by mouth daily.     Yes Historical Provider, MD  loratadine (CLARITIN) 10 MG tablet Take 1 tablet (10 mg total) by mouth daily. 07/25/14  Yes Shon Baton, MD  montelukast (SINGULAIR) 10 MG tablet Take 10 mg by mouth at bedtime.   Yes Historical Provider, MD  Olopatadine HCl (PATADAY) 0.2 % SOLN Place 2 drops into both eyes as needed (allergies).  08/19/14  Yes Historical Provider, MD  omeprazole (PRILOSEC) 40 MG capsule Take 40 mg by mouth daily. 08/19/14  Yes Historical Provider, MD  PATANASE 0.6 % SOLN Place 2 sprays into the nose 2 (two) times daily. 03/27/15  Yes Historical Provider, MD  QVAR 80 MCG/ACT inhaler Inhale 2 puffs into the lungs 2 (two) times daily. 03/24/15  Yes Historical Provider, MD  rOPINIRole (REQUIP) 1 MG tablet Take 1 mg by mouth at bedtime.   Yes Historical Provider, MD  traZODone (DESYREL) 100 MG tablet Take 50-100 mg by mouth at bedtime. 04/04/15  Yes Historical Provider, MD  cephALEXin (KEFLEX) 500 MG capsule Take 1 capsule (500 mg total) by mouth 3 (three) times daily. Patient not taking: Reported on 04/26/2015 03/16/15   Garlon Hatchet, PA-C  famotidine (PEPCID) 20 MG tablet Take 1 tablet (20 mg total) by mouth 2 (two) times daily. Patient not taking: Reported on 12/22/2014 12/14/14   Shandel Palumbo, MD  HYDROcodone-acetaminophen (NORCO/VICODIN) 5-325 MG per tablet Take 1 tablet by mouth every 4 (four) hours as needed for moderate pain. 04/26/15   Earley Favor, NP  ibuprofen (ADVIL,MOTRIN) 600 MG tablet Take 1 tablet (600 mg total) by mouth every 6 (six) hours as needed. 04/26/15   Earley Favor, NP  methocarbamol (ROBAXIN) 500 MG tablet Take 1 tablet (500 mg total) by mouth 2 (two) times daily. 04/26/15   Earley Favor, NP  ondansetron (ZOFRAN ODT) 4 MG disintegrating tablet Take 1 tablet (4 mg total) by mouth every 8 (eight) hours as needed for nausea or vomiting. Patient not  taking: Reported on 12/14/2014 10/17/14   Kaitlyn Szekalski, PA-C   BP 132/86 mmHg  Pulse 79  Temp(Src) 98.5 F (36.9 C) (Oral)  Resp 20  Wt 268 lb (121.564 kg)  SpO2 96%  LMP  Physical Exam  Constitutional: She is oriented to person, place, and time. She appears well-developed and well-nourished.  HENT:  Head: Normocephalic.  Eyes: Pupils are equal, round, and reactive to light.  Neck: Normal range of motion.  Cardiovascular: Normal rate and regular rhythm.   Pulmonary/Chest: Effort normal and breath sounds normal.  Abdominal: She exhibits no distension. There is tenderness. There is no rebound and no guarding.  Genitourinary: No tenderness in the vagina. Vaginal discharge found.  Pain at entroitis  Musculoskeletal: She exhibits tenderness. She exhibits no edema.       Lumbar back: She exhibits tenderness and pain. She exhibits normal range of motion, no swelling, no edema, no deformity, no laceration and no  spasm.       Back:  Neurological: She is alert and oriented to person, place, and time.  Skin: Skin is warm and dry. No erythema.  Nursing note and vitals reviewed.   ED Course  Procedures (including critical care time) Labs Review Labs Reviewed  WET PREP, GENITAL - Abnormal; Notable for the following:    WBC, Wet Prep HPF POC MANY (*)    All other components within normal limits  URINALYSIS, ROUTINE W REFLEX MICROSCOPIC (NOT AT [facility]) - Abnormal; Notable for the following:    APPearance CLOUDY (*)    Leukocytes, UA SMALL (*)    All other components within normal limits  URINE MICROSCOPIC-ADD ON - Abnormal; Notable for the following:    Bacteria, UA FEW (*)    Crystals CA OXALATE CRYSTALS (*)    All other components within normal limits  RPR  HIV ANTIBODY (ROUTINE TESTING)  POC URINE PREG, ED  GC/CHLAMYDIA PROBE AMP ([place]) NOT AT [facility]    Imaging Review Dg Lumbar Spine Complete  04/26/2015   CLINICAL DATA:  Acute onset of worsening lower back pain,  radiating to both hips. Initial encounter.  EXAM: LUMBAR SPINE - COMPLETE 4+ VIEW  COMPARISON:  Abdominal radiograph performed 03/16/2015, and CT of the abdomen and pelvis from 12/23/2014  FINDINGS: There is no evidence of fracture or subluxation. Vertebral bodies demonstrate normal height and alignment. Intervertebral disc spaces are preserved. The visualized neural foramina are grossly unremarkable in appearance.  The visualized bowel gas pattern is unremarkable in appearance; air and stool are noted within the colon. The sacroiliac joints are within normal limits. Clips are seen overlying the right mid abdomen.  IMPRESSION: No evidence of fracture or subluxation along the lumbar spine.   Electronically Signed   By: Roanna Raider M.D.   On: 04/26/2015 03:15     EKG Interpretation None      MDM   Final diagnoses:  Flank pain        Earley Favor, NP 04/26/15 1478  Derwood Kaplan, MD 04/30/15 1525

## 2015-04-26 NOTE — ED Notes (Signed)
Patient returned from XR. 

## 2015-04-26 NOTE — ED Notes (Signed)
Pelvic cart set up 

## 2015-04-26 NOTE — ED Notes (Signed)
NP at bedside.

## 2015-04-27 ENCOUNTER — Emergency Department (HOSPITAL_COMMUNITY)
Admission: EM | Admit: 2015-04-27 | Discharge: 2015-04-27 | Disposition: A | Discharge status: Home / Self Care | Payer: Medicaid Other | Attending: Emergency Medicine | Admitting: Emergency Medicine

## 2015-04-27 ENCOUNTER — Encounter (HOSPITAL_COMMUNITY): Payer: Self-pay

## 2015-04-27 DIAGNOSIS — Z79899 Other long term (current) drug therapy: Secondary | ICD-10-CM | POA: Insufficient documentation

## 2015-04-27 DIAGNOSIS — Z880 Allergy status to penicillin: Secondary | ICD-10-CM | POA: Diagnosis not present

## 2015-04-27 DIAGNOSIS — K029 Dental caries, unspecified: Secondary | ICD-10-CM | POA: Insufficient documentation

## 2015-04-27 DIAGNOSIS — K088 Other specified disorders of teeth and supporting structures: Secondary | ICD-10-CM | POA: Diagnosis present

## 2015-04-27 DIAGNOSIS — Z862 Personal history of diseases of the blood and blood-forming organs and certain disorders involving the immune mechanism: Secondary | ICD-10-CM | POA: Insufficient documentation

## 2015-04-27 DIAGNOSIS — Z87891 Personal history of nicotine dependence: Secondary | ICD-10-CM | POA: Insufficient documentation

## 2015-04-27 DIAGNOSIS — Z8619 Personal history of other infectious and parasitic diseases: Secondary | ICD-10-CM | POA: Diagnosis not present

## 2015-04-27 MED ORDER — HYDROCODONE-ACETAMINOPHEN 5-325 MG PO TABS: 1.0000 | ORAL_TABLET | Freq: Four times a day (QID) | ORAL | Status: DC | PRN

## 2015-04-27 MED ORDER — CLINDAMYCIN HCL 300 MG PO CAPS: 300.0000 mg | ORAL_CAPSULE | Freq: Four times a day (QID) | ORAL | Status: DC

## 2015-04-27 NOTE — ED Notes (Signed)
Pharmacy tech at bedside 

## 2015-04-27 NOTE — ED Notes (Signed)
MD at bedside. 

## 2015-04-27 NOTE — ED Notes (Signed)
Patient c/o R upper jaw pain. Patient states she called her dentist today and was referred here for evaluation.

## 2015-04-27 NOTE — ED Notes (Signed)
Patient states she has dental caries right upper jaw. Patient has gum swelling. Patient states she called her dentist and was told to come to the ED. Patient denies any problems swallowing or breathing.

## 2015-04-27 NOTE — Discharge Instructions (Signed)
Clindamycin as prescribed. Hydrocodone as prescribed as needed for pain.  Follow-up with your dentist in the next 2-3 days to be reevaluated.   Dental Caries Dental caries (also called tooth decay) is the most common oral disease. It can occur at any age but is more common in children and young adults.  HOW DENTAL CARIES DEVELOPS  The process of decay begins when bacteria and foods (particularly sugars and starches) combine in your mouth to produce plaque. Plaque is a substance that sticks to the hard, outer surface of a tooth (enamel). The bacteria in plaque produce acids that attack enamel. These acids may also attack the root surface of a tooth (cementum) if it is exposed. Repeated attacks dissolve these surfaces and create holes in the tooth (cavities). If left untreated, the acids destroy the other layers of the tooth.  RISK FACTORS  Frequent sipping of sugary beverages.   Frequent snacking on sugary and starchy foods, especially those that easily get stuck in the teeth.   Poor oral hygiene.   Dry mouth.   Substance abuse such as methamphetamine abuse.   Broken or poor-fitting dental restorations.   Eating disorders.   Gastroesophageal reflux disease (GERD).   Certain radiation treatments to the head and neck. SYMPTOMS In the early stages of dental caries, symptoms are seldom present. Sometimes white, chalky areas may be seen on the enamel or other tooth layers. In later stages, symptoms may include:  Pits and holes on the enamel.  Toothache after sweet, hot, or cold foods or drinks are consumed.  Pain around the tooth.  Swelling around the tooth. DIAGNOSIS  Most of the time, dental caries is detected during a regular dental checkup. A diagnosis is made after a thorough medical and dental history is taken and the surfaces of your teeth are checked for signs of dental caries. Sometimes special instruments, such as lasers, are used to check for dental caries. Dental  X-ray exams may be taken so that areas not visible to the eye (such as between the contact areas of the teeth) can be checked for cavities.  TREATMENT  If dental caries is in its early stages, it may be reversed with a fluoride treatment or an application of a remineralizing agent at the dental office. Thorough brushing and flossing at home is needed to aid these treatments. If it is in its later stages, treatment depends on the location and extent of tooth destruction:   If a small area of the tooth has been destroyed, the destroyed area will be removed and cavities will be filled with a material such as gold, silver amalgam, or composite resin.   If a large area of the tooth has been destroyed, the destroyed area will be removed and a cap (crown) will be fitted over the remaining tooth structure.   If the center part of the tooth (pulp) is affected, a procedure called a root canal will be needed before a filling or crown can be placed.   If most of the tooth has been destroyed, the tooth may need to be pulled (extracted). HOME CARE INSTRUCTIONS You can prevent, stop, or reverse dental caries at home by practicing good oral hygiene. Good oral hygiene includes:  Thoroughly cleaning your teeth at least twice a day with a toothbrush and dental floss.   Using a fluoride toothpaste. A fluoride mouth rinse may also be used if recommended by your dentist or health care provider.   Restricting the amount of sugary and starchy foods  and sugary liquids you consume.   Avoiding frequent snacking on these foods and sipping of these liquids.   Keeping regular visits with a dentist for checkups and cleanings. PREVENTION   Practice good oral hygiene.  Consider a dental sealant. A dental sealant is a coating material that is applied by your dentist to the pits and grooves of teeth. The sealant prevents food from being trapped in them. It may protect the teeth for several years.  Ask about  fluoride supplements if you live in a community without fluorinated water or with water that has a low fluoride content. Use fluoride supplements as directed by your dentist or health care provider.  Allow fluoride varnish applications to teeth if directed by your dentist or health care provider. Document Released: 08/03/2002 Document Revised: 03/28/2014 Document Reviewed: 11/13/2012 [facility] Patient Information 2015 [place], [place]. This information is not intended to replace advice given to you by your health care provider. Make sure you discuss any questions you have with your health care provider.

## 2015-04-27 NOTE — ED Provider Notes (Signed)
CSN: [id number]     Arrival date & time 04/27/15  [id number] History   First MD Initiated Contact with Patient 04/27/15 [phone]     Chief Complaint  Patient presents with  . Oral Swelling     (Consider location/radiation/quality/duration/timing/severity/associated sxs/prior Treatment) HPI Comments: Patient is a 34 year old female with history of dental caries. She presents for evaluation of right upper molar pain and facial swelling. She states that her dentist told her she needed to have several teeth pulled and this is been arranged for the 10th. She started this morning with swelling to her right cheek and face and presents for evaluation of this. She tells me she called her dentist who told her to come here to be evaluated.  Patient is a 34 y.o. female presenting with tooth pain. The history is provided by the patient.  Dental Pain Location:  Upper Upper teeth location:  3/RU 1st molar, 2/RU 2nd molar and 1/RU 3rd molar Quality:  Throbbing Severity:  Moderate Onset quality:  Sudden Duration:  2 days Timing:  Constant Progression:  Worsening Chronicity:  New Relieved by:  Nothing Worsened by:  Nothing tried Ineffective treatments:  None tried   Past Medical History  Diagnosis Date  . Anemia   . Chlamydia    Past Surgical History  Procedure Laterality Date  . Cesarean section    . Appendectomy    . Tonsillectomy    . Cesarean section      x4  . Tubal ligation    . Cholecystectomy N/A 12/24/2014    Procedure: LAPAROSCOPIC CHOLECYSTECTOMY WITH INTRAOPERATIVE CHOLANGIOGRAM;  Surgeon: Glenna FellowsBenjamin Hoxworth, MD;  Location: [facility];  Service: General;  Laterality: N/A;  . Adenoidectomy     Family History  Problem Relation Age of Onset  . Diabetes Mother   . Hypertension Mother   . CAD Mother    History  Substance Use Topics  . Smoking status: Former Smoker -- 0.50 packs/day    Types: Cigarettes    Quit date: 04/25/2014  . Smokeless tobacco: Never Used  . Alcohol Use: No   Comment: former   OB History    Gravida Para Term Preterm AB TAB SAB Ectopic Multiple Living   5 5 3 2      3      Review of Systems  All other systems reviewed and are negative.     Allergies  Augmentin; Chantix; Percocet; and Penicillins  Home Medications   Prior to Admission medications   Medication Sig Start Date End Date Taking? Authorizing Provider  albuterol (PROVENTIL HFA;VENTOLIN HFA) 108 (90 BASE) MCG/ACT inhaler Inhale 2 puffs into the lungs every 4 (four) hours as needed for wheezing or shortness of breath. 07/25/14  Yes Shon Batonourtney F Horton, MD  cetirizine (ZYRTEC) 10 MG tablet Take 10 mg by mouth daily as needed for allergies.  03/24/15  Yes Historical Provider, MD  FLUoxetine (PROZAC) 20 MG capsule Take 40 mg by mouth at bedtime.    Yes Historical Provider, MD  Levonorgestrel-Ethinyl Estradiol (SEASONIQUE) 0.15-0.03 &0.01 MG tablet Take 1 tablet by mouth daily.     Yes Historical Provider, MD  loratadine (CLARITIN) 10 MG tablet Take 1 tablet (10 mg total) by mouth daily. 07/25/14  Yes Shon Batonourtney F Horton, MD  montelukast (SINGULAIR) 10 MG tablet Take 10 mg by mouth at bedtime.   Yes Historical Provider, MD  Olopatadine HCl (PATADAY) 0.2 % SOLN Place 2 drops into both eyes as needed (allergies).  08/19/14  Yes Historical Provider, MD  omeprazole (  PRILOSEC) 40 MG capsule Take 40 mg by mouth daily. 08/19/14  Yes Historical Provider, MD  PATANASE 0.6 % SOLN Place 2 sprays into the nose 2 (two) times daily. 03/27/15  Yes Historical Provider, MD  QVAR 80 MCG/ACT inhaler Inhale 2 puffs into the lungs 2 (two) times daily. 03/24/15  Yes Historical Provider, MD  rOPINIRole (REQUIP) 1 MG tablet Take 1 mg by mouth at bedtime.   Yes Historical Provider, MD  traZODone (DESYREL) 100 MG tablet Take 50-100 mg by mouth at bedtime. 04/04/15  Yes Historical Provider, MD  cephALEXin (KEFLEX) 500 MG capsule Take 1 capsule (500 mg total) by mouth 3 (three) times daily. Patient not taking: Reported on  04/26/2015 03/16/15   Garlon Hatchet, PA-C  EPINEPHrine (EPIPEN 2-PAK) 0.3 mg/0.3 mL IJ SOAJ injection Inject 1 Dose as directed as needed. 08/19/14   Historical Provider, MD  famotidine (PEPCID) 20 MG tablet Take 1 tablet (20 mg total) by mouth 2 (two) times daily. Patient not taking: Reported on 12/22/2014 12/14/14   Anjanette Palumbo, MD  HYDROcodone-acetaminophen (NORCO/VICODIN) 5-325 MG per tablet Take 1 tablet by mouth every 4 (four) hours as needed for moderate pain. Patient not taking: Reported on 04/27/2015 04/26/15   Earley Favor, NP  ibuprofen (ADVIL,MOTRIN) 600 MG tablet Take 1 tablet (600 mg total) by mouth every 6 (six) hours as needed. Patient not taking: Reported on 04/27/2015 04/26/15   Earley Favor, NP  methocarbamol (ROBAXIN) 500 MG tablet Take 1 tablet (500 mg total) by mouth 2 (two) times daily. Patient not taking: Reported on 04/27/2015 04/26/15   Earley Favor, NP  ondansetron (ZOFRAN ODT) 4 MG disintegrating tablet Take 1 tablet (4 mg total) by mouth every 8 (eight) hours as needed for nausea or vomiting. Patient not taking: Reported on 12/14/2014 10/17/14   Kaitlyn Szekalski, PA-C   BP 134/88 mmHg  Pulse 92  Temp(Src) 98.5 F (36.9 C) (Oral)  Resp 20  Ht  (1.778 m)  Wt 268 lb (121.564 kg)  BMI 38.45 kg/m2  SpO2 98%  LMP 04/20/2015 Physical Exam  Constitutional: She is oriented to person, place, and time. She appears well-developed and well-nourished. No distress.  HENT:  Head: Normocephalic and atraumatic.  There is mild swelling to the right cheek. There is some gingival inflammation, however no obvious abscess. She has multiple decayed teeth, especially the right upper molars.  Neck: Normal range of motion. Neck supple.  Musculoskeletal: Normal range of motion.  Lymphadenopathy:    She has no cervical adenopathy.  Neurological: She is alert and oriented to person, place, and time.  Skin: Skin is warm and dry. She is not diaphoretic.  Nursing note and vitals reviewed.   ED  Course  Procedures (including critical care time) Labs Review Labs Reviewed - No data to display  Imaging Review Dg Lumbar Spine Complete  04/26/2015   CLINICAL DATA:  Acute onset of worsening lower back pain, radiating to both hips. Initial encounter.  EXAM: LUMBAR SPINE - COMPLETE 4+ VIEW  COMPARISON:  Abdominal radiograph performed 03/16/2015, and CT of the abdomen and pelvis from 12/23/2014  FINDINGS: There is no evidence of fracture or subluxation. Vertebral bodies demonstrate normal height and alignment. Intervertebral disc spaces are preserved. The visualized neural foramina are grossly unremarkable in appearance.  The visualized bowel gas pattern is unremarkable in appearance; air and stool are noted within the colon. The sacroiliac joints are within normal limits. Clips are seen overlying the right mid abdomen.  IMPRESSION: No evidence  of fracture or subluxation along the lumbar spine.   Electronically Signed   By: Roanna Raider M.D.   On: 04/26/2015 03:15     EKG Interpretation None      MDM   Final diagnoses:  None    We'll treat with anti-biotics, pain medication, and follow-up with her dentist.    Geoffery Lyons, MD 04/27/15 [phone]

## 2015-05-04 ENCOUNTER — Encounter (HOSPITAL_COMMUNITY): Payer: Self-pay

## 2015-05-04 ENCOUNTER — Emergency Department (HOSPITAL_COMMUNITY)
Admission: EM | Admit: 2015-05-04 | Discharge: 2015-05-05 | Disposition: A | Discharge status: Home / Self Care | Payer: Medicaid Other | Attending: Emergency Medicine | Admitting: Emergency Medicine

## 2015-05-04 DIAGNOSIS — Y998 Other external cause status: Secondary | ICD-10-CM | POA: Insufficient documentation

## 2015-05-04 DIAGNOSIS — S39012A Strain of muscle, fascia and tendon of lower back, initial encounter: Secondary | ICD-10-CM | POA: Diagnosis not present

## 2015-05-04 DIAGNOSIS — Z880 Allergy status to penicillin: Secondary | ICD-10-CM | POA: Diagnosis not present

## 2015-05-04 DIAGNOSIS — Y9389 Activity, other specified: Secondary | ICD-10-CM | POA: Diagnosis not present

## 2015-05-04 DIAGNOSIS — Z862 Personal history of diseases of the blood and blood-forming organs and certain disorders involving the immune mechanism: Secondary | ICD-10-CM | POA: Diagnosis not present

## 2015-05-04 DIAGNOSIS — S4992XA Unspecified injury of left shoulder and upper arm, initial encounter: Secondary | ICD-10-CM | POA: Diagnosis not present

## 2015-05-04 DIAGNOSIS — Z79899 Other long term (current) drug therapy: Secondary | ICD-10-CM | POA: Diagnosis not present

## 2015-05-04 DIAGNOSIS — Z8619 Personal history of other infectious and parasitic diseases: Secondary | ICD-10-CM | POA: Insufficient documentation

## 2015-05-04 DIAGNOSIS — F419 Anxiety disorder, unspecified: Secondary | ICD-10-CM | POA: Insufficient documentation

## 2015-05-04 DIAGNOSIS — Y92410 Unspecified street and highway as the place of occurrence of the external cause: Secondary | ICD-10-CM | POA: Insufficient documentation

## 2015-05-04 DIAGNOSIS — S3992XA Unspecified injury of lower back, initial encounter: Secondary | ICD-10-CM | POA: Diagnosis present

## 2015-05-04 MED ORDER — LORAZEPAM 1 MG PO TABS
1.0000 mg | ORAL_TABLET | Freq: Once | ORAL | Status: AC
  Administered 2015-05-05: 1 mg via ORAL
  Filled 2015-05-04: qty 1
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
## 2015-05-04 NOTE — ED Notes (Signed)
Pt states she is also jittery and nervous/anxious especially when driving.

## 2015-05-05 ENCOUNTER — Emergency Department (HOSPITAL_COMMUNITY): Payer: Medicaid Other

## 2015-05-05 MED ORDER — HYDROCODONE-ACETAMINOPHEN 5-325 MG PO TABS: 1.0000 | ORAL_TABLET | Freq: Four times a day (QID) | ORAL | Status: DC | PRN

## 2015-05-05 MED ORDER — IBUPROFEN 800 MG PO TABS: 800.0000 mg | ORAL_TABLET | Freq: Three times a day (TID) | ORAL | Status: DC | PRN

## 2015-05-05 NOTE — Discharge Instructions (Signed)
Return here as needed.  Your x-rays were negative.  Use ice and heat on the areas that are sore

## 2015-06-08 ENCOUNTER — Other Ambulatory Visit: Payer: Self-pay | Admitting: Otolaryngology

## 2015-06-08 DIAGNOSIS — E669 Obesity, unspecified: Secondary | ICD-10-CM

## 2015-06-14 ENCOUNTER — Ambulatory Visit
Admission: RE | Admit: 2015-06-14 | Discharge: 2015-06-14 | Disposition: A | Discharge status: Home / Self Care | Payer: Medicaid Other | Source: Ambulatory Visit | Attending: Otolaryngology | Admitting: Otolaryngology

## 2015-06-14 DIAGNOSIS — E669 Obesity, unspecified: Secondary | ICD-10-CM

## 2015-07-06 ENCOUNTER — Encounter (HOSPITAL_COMMUNITY): Payer: Self-pay

## 2015-07-06 ENCOUNTER — Emergency Department (HOSPITAL_COMMUNITY)
Admission: EM | Admit: 2015-07-06 | Discharge: 2015-07-07 | Disposition: A | Discharge status: Home / Self Care | Payer: Medicaid Other | Attending: Emergency Medicine | Admitting: Emergency Medicine

## 2015-07-06 ENCOUNTER — Emergency Department (HOSPITAL_COMMUNITY): Payer: Medicaid Other

## 2015-07-06 DIAGNOSIS — T814XXA Infection following a procedure, initial encounter: Secondary | ICD-10-CM | POA: Diagnosis not present

## 2015-07-06 DIAGNOSIS — Y838 Other surgical procedures as the cause of abnormal reaction of the patient, or of later complication, without mention of misadventure at the time of the procedure: Secondary | ICD-10-CM | POA: Diagnosis not present

## 2015-07-06 DIAGNOSIS — Z862 Personal history of diseases of the blood and blood-forming organs and certain disorders involving the immune mechanism: Secondary | ICD-10-CM | POA: Diagnosis not present

## 2015-07-06 DIAGNOSIS — Z79818 Long term (current) use of other agents affecting estrogen receptors and estrogen levels: Secondary | ICD-10-CM | POA: Diagnosis not present

## 2015-07-06 DIAGNOSIS — Z79899 Other long term (current) drug therapy: Secondary | ICD-10-CM | POA: Diagnosis not present

## 2015-07-06 DIAGNOSIS — Z880 Allergy status to penicillin: Secondary | ICD-10-CM | POA: Diagnosis not present

## 2015-07-06 DIAGNOSIS — Z8619 Personal history of other infectious and parasitic diseases: Secondary | ICD-10-CM | POA: Diagnosis not present

## 2015-07-06 DIAGNOSIS — Z7951 Long term (current) use of inhaled steroids: Secondary | ICD-10-CM | POA: Insufficient documentation

## 2015-07-06 DIAGNOSIS — M542 Cervicalgia: Secondary | ICD-10-CM | POA: Diagnosis present

## 2015-07-06 DIAGNOSIS — IMO0001 Reserved for inherently not codable concepts without codable children: Secondary | ICD-10-CM

## 2015-07-06 DIAGNOSIS — Z87891 Personal history of nicotine dependence: Secondary | ICD-10-CM | POA: Diagnosis not present

## 2015-07-06 DIAGNOSIS — L03221 Cellulitis of neck: Secondary | ICD-10-CM | POA: Insufficient documentation

## 2015-07-06 LAB — I-STAT CHEM 8, ED
BUN: 8 mg/dL (ref 6–20)
CREATININE: 0.7 mg/dL (ref 0.44–1.00)
Calcium, Ion: 1.1 mmol/L — ABNORMAL LOW (ref 1.12–1.23)
Chloride: 103 mmol/L (ref 101–111)
GLUCOSE: 91 mg/dL (ref 65–99)
HCT: 39 % (ref 36.0–46.0)
Hemoglobin: 13.3 g/dL (ref 12.0–15.0)
POTASSIUM: 3.7 mmol/L (ref 3.5–5.1)
Sodium: 137 mmol/L (ref 135–145)
TCO2: 23 mmol/L (ref 0–100)

## 2015-07-06 LAB — I-STAT CG4 LACTIC ACID, ED: Lactic Acid, Venous: 1.56 mmol/L (ref 0.5–2.0)

## 2015-07-06 MED ORDER — MORPHINE SULFATE 4 MG/ML IJ SOLN
6.0000 mg | Freq: Once | INTRAMUSCULAR | Status: AC
  Administered 2015-07-06: 6 mg via INTRAVENOUS
  Filled 2015-07-06: qty 2

## 2015-07-06 MED ORDER — METRONIDAZOLE IN NACL 5-0.79 MG/ML-% IV SOLN
500.0000 mg | Freq: Once | INTRAVENOUS | Status: AC
  Administered 2015-07-07: 500 mg via INTRAVENOUS
  Filled 2015-07-06: qty 100

## 2015-07-06 MED ORDER — DEXTROSE 5 % IV SOLN
2.0000 g | Freq: Once | INTRAVENOUS | Status: AC
  Administered 2015-07-06: 2 g via INTRAVENOUS
  Filled 2015-07-06: qty 2

## 2015-07-06 MED ORDER — IOHEXOL 300 MG/ML  SOLN
100.0000 mL | Freq: Once | INTRAMUSCULAR | Status: AC | PRN
  Administered 2015-07-06: 100 mL via INTRAVENOUS

## 2015-07-06 MED ORDER — SODIUM CHLORIDE 0.9 % IV BOLUS (SEPSIS)
1000.0000 mL | Freq: Once | INTRAVENOUS | Status: AC
  Administered 2015-07-06: 1000 mL via INTRAVENOUS

## 2015-07-06 MED ORDER — VANCOMYCIN HCL IN DEXTROSE 1-5 GM/200ML-% IV SOLN
1000.0000 mg | Freq: Once | INTRAVENOUS | Status: AC
  Administered 2015-07-07: 1000 mg via INTRAVENOUS
  Filled 2015-07-06: qty 200

## 2015-07-06 MED ORDER — ONDANSETRON HCL 4 MG/2ML IJ SOLN
4.0000 mg | Freq: Once | INTRAMUSCULAR | Status: AC
  Administered 2015-07-06: 4 mg via INTRAVENOUS
  Filled 2015-07-06: qty 2

## 2015-07-06 MED ORDER — SODIUM CHLORIDE 0.9 % IV BOLUS (SEPSIS)
1000.0000 mL | INTRAVENOUS | Status: DC
  Administered 2015-07-07: 1000 mL via INTRAVENOUS

## 2015-07-06 NOTE — ED Notes (Signed)
Below order not completed by EW. 

## 2015-07-06 NOTE — ED Provider Notes (Signed)
CSN: [id number]     Arrival date & time 07/06/15  2205 History   First MD Initiated Contact with Patient 07/06/15 2231     Chief Complaint  Patient presents with  . Neck Pain     (Consider location/radiation/quality/duration/timing/severity/associated sxs/prior Treatment) HPI   34 year old female presenting for evaluation of neck pain. patient reports she has had recurrent pain to the left side of her neck since last September. Complaining of dull pain and fullness. She has been seen and evaluated by her primary care Dr. For this and also had been evaluated by ENT specialist. She had a deep jugular node excision performed 2 days ago. She was discharged with clindamycin and hydrocodone. Since yesterday she has noticed increasing pain and swelling to the surgical site. Today she reported having fever of 100.4, sharp stabbing pain that is persistent and redness and swelling to the affected area. She now having trouble swallowing due to pain. She denies having chest pain, shortness of breath vomiting or diarrhea, or abdominal pain. She is concerned due to the worsening of her symptoms. No prior history of cancer.  Her PCP is from [place] in [facility].  Her ENT specialist works in [employer] and [employer].    Past Medical History  Diagnosis Date  . Anemia   . Chlamydia    Past Surgical History  Procedure Laterality Date  . Cesarean section    . Appendectomy    . Tonsillectomy    . Cesarean section      x4  . Tubal ligation    . Cholecystectomy N/A 12/24/2014    Procedure: LAPAROSCOPIC CHOLECYSTECTOMY WITH INTRAOPERATIVE CHOLANGIOGRAM;  Surgeon: Excell Seltzer, MD;  Location: [facility];  Service: General;  Laterality: N/A;  . Adenoidectomy     Family History  Problem Relation Age of Onset  . Diabetes Mother   . Hypertension Mother   . CAD Mother    Social History  Substance Use Topics  . Smoking status: Former Smoker -- 0.50 packs/day    Types: Cigarettes    Quit date:  04/25/2014  . Smokeless tobacco: Never Used  . Alcohol Use: No     Comment: former   OB History    Gravida Para Term Preterm AB TAB SAB Ectopic Multiple Living   '5 5 3 2      3     ' Review of Systems  All other systems reviewed and are negative.     Allergies  Augmentin; Chantix; Percocet; and Penicillins  Home Medications   Prior to Admission medications   Medication Sig Start Date End Date Taking? Authorizing Provider  albuterol (PROVENTIL HFA;VENTOLIN HFA) 108 (90 BASE) MCG/ACT inhaler Inhale 2 puffs into the lungs every 4 (four) hours as needed for wheezing or shortness of breath. 07/25/14   Merryl Hacker, MD  cephALEXin (KEFLEX) 500 MG capsule Take 1 capsule (500 mg total) by mouth 3 (three) times daily. Patient not taking: Reported on 04/26/2015 03/16/15   Larene Pickett, PA-C  cetirizine (ZYRTEC) 10 MG tablet Take 10 mg by mouth daily as needed for allergies.  03/24/15   Historical Provider, MD  clindamycin (CLEOCIN) 300 MG capsule Take 1 capsule (300 mg total) by mouth 4 (four) times daily. X 7 days Patient not taking: Reported on 05/04/2015 04/27/15   Veryl Speak, MD  EPINEPHrine (EPIPEN 2-PAK) 0.3 mg/0.3 mL IJ SOAJ injection Inject 1 Dose as directed as needed. 08/19/14   Historical Provider, MD  famotidine (PEPCID) 20 MG tablet Take 1  tablet (20 mg total) by mouth 2 (two) times daily. Patient not taking: Reported on 12/22/2014 12/14/14   Rashon Palumbo, MD  FLUoxetine (PROZAC) 20 MG capsule Take 40 mg by mouth at bedtime.     Historical Provider, MD  HYDROcodone-acetaminophen (NORCO/VICODIN) 5-325 MG per tablet Take 1 tablet by mouth every 6 (six) hours as needed for moderate pain. 05/05/15   Dalia Heading, PA-C  ibuprofen (ADVIL,MOTRIN) 800 MG tablet Take 1 tablet (800 mg total) by mouth every 8 (eight) hours as needed. 05/05/15   Dalia Heading, PA-C  Levonorgestrel-Ethinyl Estradiol (SEASONIQUE) 0.15-0.03 &0.01 MG tablet Take 1 tablet by mouth daily.      Historical  Provider, MD  loratadine (CLARITIN) 10 MG tablet Take 1 tablet (10 mg total) by mouth daily. 07/25/14   Merryl Hacker, MD  methocarbamol (ROBAXIN) 500 MG tablet Take 1 tablet (500 mg total) by mouth 2 (two) times daily. Patient not taking: Reported on 04/27/2015 04/26/15   Junius Creamer, NP  montelukast (SINGULAIR) 10 MG tablet Take 10 mg by mouth at bedtime.    Historical Provider, MD  Olopatadine HCl (PATADAY) 0.2 % SOLN Place 2 drops into both eyes as needed (allergies).  08/19/14   Historical Provider, MD  omeprazole (PRILOSEC) 40 MG capsule Take 40 mg by mouth daily. 08/19/14   Historical Provider, MD  ondansetron (ZOFRAN ODT) 4 MG disintegrating tablet Take 1 tablet (4 mg total) by mouth every 8 (eight) hours as needed for nausea or vomiting. Patient not taking: Reported on 12/14/2014 10/17/14   Kaitlyn Szekalski, PA-C  PATANASE 0.6 % SOLN Place 2 sprays into the nose 2 (two) times daily. 03/27/15   Historical Provider, MD  QVAR 80 MCG/ACT inhaler Inhale 2 puffs into the lungs 2 (two) times daily. 03/24/15   Historical Provider, MD  rOPINIRole (REQUIP) 1 MG tablet Take 1 mg by mouth at bedtime.    Historical Provider, MD  traZODone (DESYREL) 100 MG tablet Take 50-100 mg by mouth at bedtime. 04/04/15   Historical Provider, MD   BP 119/85 mmHg  Pulse 109  Temp(Src) 98.8 F (37.1 C) (Oral)  Resp 20  SpO2 100% Physical Exam  Constitutional: She is oriented to person, place, and time. She appears well-developed and well-nourished. No distress.  Moderately obese Caucasian female appears to be uncomfortable.  HENT:  Head: Atraumatic.  Throat: Uvula is midline, no tonsillar enlargement or exudates, no obvious deep tissue infection in mouth.   Eyes: Conjunctivae are normal.  Neck: Neck supple. No tracheal deviation present. No thyromegaly present.  Neck: Surgical site noted to the left lower neck with surrounding induration, erythema, warmth, and exquisite tenderness to palpation.  No nuchal  rigidity.  Cardiovascular:  Tachycardia without murmur, rubs or gallop  Pulmonary/Chest: Effort normal and breath sounds normal. No stridor.  Abdominal: Soft. There is no tenderness.  Lymphadenopathy:    She has cervical adenopathy.  Neurological: She is alert and oriented to person, place, and time.  Skin: No rash noted.  Psychiatric: She has a normal mood and affect.  Nursing note and vitals reviewed.   ED Course  Procedures (including critical care time)  Patient here with worsening neck pain fever or chills and difficulty swallowing status post deep jugular node excision performed 2 days ago. due to worsening pain and swelling, concerning for early forming infection. Pt is tachycardic and tachypneic with fever at home, therefore she met SIRS/sepsis criteria.  She is PCN allergy.  Will initiate sepsis protocol.  ABX include aztreonam,  flagyl and vanc.  Care discussed with Dr. Claudine Mouton.    12:27 AM CT scan of soft tissue neck showed a large focus of soft tissue air at the left side of neck which reflect recent surgery. Diffuse surrounding soft tissue edema tract on the left side of neck. Significant amount of soft tissue edema tracks into the fat around the left internal and common carotid arteries, and posterior left submandibular gland. No evidence of abscess or evidence of airway compromise at this time.  Patient has normal white count, normal lactic acid, and normal electrolytes. I did consult with our ENT specialist, Dr. Erik Obey who have reviewed her labs, vital sign and CT scan.  He recommend pt to call and follow up with her surgeon today for further care.  I made pt aware of plan and she agrees.  Otherwise, pt will be discharge once she received her antibiotics and pain medication along with IVF.    1:02 AM Sign out to oncoming provider who will d/c pt once IVF and abx are given.    Labs Review Labs Reviewed  CBC WITH DIFFERENTIAL/PLATELET - Abnormal; Notable for the following:     RBC 6.05 (*)    Hemoglobin 11.7 (*)    MCV 61.5 (*)    MCH 19.3 (*)    RDW 15.8 (*)    Eosinophils Relative 9 (*)    All other components within normal limits  I-STAT CHEM 8, ED - Abnormal; Notable for the following:    Calcium, Ion 1.10 (*)    All other components within normal limits  CULTURE, BLOOD (ROUTINE X 2)  CULTURE, BLOOD (ROUTINE X 2)  URINE CULTURE  URINALYSIS, ROUTINE W REFLEX MICROSCOPIC (NOT AT [facility])  I-STAT CG4 LACTIC ACID, ED    Imaging Review Ct Soft Tissue Neck W Contrast  07/06/2015   CLINICAL DATA:  Status post jugular nodule resection, with worsening swelling, fever and nausea. Initial encounter.  EXAM: CT NECK WITH CONTRAST  TECHNIQUE: Multidetector CT imaging of the neck was performed using the standard protocol following the bolus administration of intravenous contrast.  CONTRAST:  181m OMNIPAQUE IOHEXOL 300 MG/ML  SOLN  COMPARISON:  CT of the head, and MRI of the brain performed 02/11/2013  FINDINGS: Pharynx and larynx: The nasopharynx, oropharynx and hypopharynx are unremarkable. The valleculae and piriform sinuses are within normal limits. The proximal trachea is unremarkable in appearance. The upper parapharyngeal fat planes are preserved.  There is a large focus of soft tissue air at the left side of the neck, reflecting recent surgery, with diffuse surrounding soft tissue edema tracking along the left side of the neck, involving the anterior aspect of the neck inferiorly. Minimal soft tissue stranding tracks inferior to the level of the thyroid gland. A significant amount of soft tissue edema is seen tracking about the deep fat at the left internal and common carotid arteries. No defined abscess is identified.  Salivary glands: The parotid and submandibular glands are symmetric and grossly unremarkable in appearance. Soft tissue edema tracks posterior to the left submandibular gland.  Thyroid: The thyroid gland is grossly unremarkable in appearance.  Lymph nodes: No  definite cervical lymphadenopathy is appreciated.  Vascular: The visualized vasculature is grossly unremarkable, despite surrounding soft tissue edema on the left side.  Limited intracranial: The visualized portions of the brain are grossly unremarkable.  Visualized orbits: The visualized portions of the orbits are within normal limits.  Mastoids and visualized paranasal sinuses: The visualized paranasal sinuses and mastoid air cells  are well-aerated.  Skeleton: The visualized osseous structures are grossly unremarkable. Prevertebral soft tissues are within normal limits.  Upper chest: The visualized superior mediastinum is unremarkable in appearance. The lung apices are grossly clear.  IMPRESSION: 1. Large focus of soft tissue air at the left side of the neck, reflecting recent surgery. Diffuse surrounding soft tissue edema tracks along the left side of the neck, and the anterior inferior neck, with minimal soft tissue edema seen extending inferior to the thyroid gland. Significant amount of soft tissue edema tracks into the deep fat about the left internal and common carotid arteries, and posterior to the left submandibular gland. No definite evidence of abscess at this time. 2. No evidence of airway compromise at this time.   Electronically Signed   By: Garald Balding M.D.   On: 07/06/2015 23:58   I, Ami Mally, personally reviewed and evaluated these images and lab results as part of my medical decision-making.   EKG Interpretation None     ED ECG REPORT   Date: 07/07/2015  Rate: 108  Rhythm: sinus tachycardia  QRS Axis: normal  Intervals: QT prolonged  ST/T Wave abnormalities: nonspecific T wave changes  Conduction Disutrbances:none  Narrative Interpretation:   Old EKG Reviewed: unchanged  I have personally reviewed the EKG tracing and agree with the computerized printout as noted.   MDM   Final diagnoses:  Postoperative cellulitis of surgical wound, initial encounter    BP 119/85  mmHg  Pulse 109  Temp(Src) 98.8 F (37.1 C) (Oral)  Resp 20  Ht '5\' 8"'  (1.727 m)  Wt 258 lb (117.028 kg)  BMI 39.24 kg/m2  SpO2 100%  I have reviewed nursing notes and vital signs. I personally viewed the imaging tests through PACS system and agrees with radiologist's intepretation I reviewed available ER/hospitalization records through the EMR     Domenic Moras, PA-C 07/07/15 0106  Everlene Balls, MD 07/07/15 2119

## 2015-07-06 NOTE — ED Notes (Signed)
Pt had a jugular nodular removed on Tuesday and the site has become swollen and more today and she's had a fever and she's nauseated

## 2015-07-07 LAB — CBC WITH DIFFERENTIAL/PLATELET
Basophils Absolute: 0 10*3/uL (ref 0.0–0.1)
Basophils Relative: 0 % (ref 0–1)
EOS PCT: 9 % — AB (ref 0–5)
Eosinophils Absolute: 0.7 10*3/uL (ref 0.0–0.7)
HCT: 37.2 % (ref 36.0–46.0)
Hemoglobin: 11.7 g/dL — ABNORMAL LOW (ref 12.0–15.0)
LYMPHS PCT: 35 % (ref 12–46)
Lymphs Abs: 2.6 10*3/uL (ref 0.7–4.0)
MCH: 19.3 pg — ABNORMAL LOW (ref 26.0–34.0)
MCHC: 31.5 g/dL (ref 30.0–36.0)
MCV: 61.5 fL — ABNORMAL LOW (ref 78.0–100.0)
MONOS PCT: 6 % (ref 3–12)
Monocytes Absolute: 0.4 10*3/uL (ref 0.1–1.0)
NEUTROS PCT: 50 % (ref 43–77)
Neutro Abs: 3.7 10*3/uL (ref 1.7–7.7)
Platelets: 259 10*3/uL (ref 150–400)
RBC: 6.05 MIL/uL — AB (ref 3.87–5.11)
RDW: 15.8 % — ABNORMAL HIGH (ref 11.5–15.5)
WBC: 7.4 10*3/uL (ref 4.0–10.5)

## 2015-07-07 LAB — URINALYSIS, ROUTINE W REFLEX MICROSCOPIC
Bilirubin Urine: NEGATIVE
Glucose, UA: NEGATIVE mg/dL
HGB URINE DIPSTICK: NEGATIVE
KETONES UR: NEGATIVE mg/dL
Leukocytes, UA: NEGATIVE
Nitrite: NEGATIVE
PROTEIN: NEGATIVE mg/dL
Urobilinogen, UA: 0.2 mg/dL (ref 0.0–1.0)
pH: 5 (ref 5.0–8.0)

## 2015-07-07 MED ORDER — METRONIDAZOLE IN NACL 5-0.79 MG/ML-% IV SOLN: 500.0000 mg | Freq: Four times a day (QID) | INTRAVENOUS | Status: DC

## 2015-07-07 MED ORDER — DEXTROSE 5 % IV SOLN: 2.0000 g | Freq: Three times a day (TID) | INTRAVENOUS | Status: DC

## 2015-07-07 MED ORDER — VANCOMYCIN HCL 10 G IV SOLR
1500.0000 mg | Freq: Once | INTRAVENOUS | Status: DC
  Filled 2015-07-07: qty 1500

## 2015-07-07 MED ORDER — DIPHENHYDRAMINE HCL 50 MG/ML IJ SOLN
12.5000 mg | Freq: Once | INTRAMUSCULAR | Status: AC
  Administered 2015-07-07: 12.5 mg via INTRAVENOUS
  Filled 2015-07-07: qty 1

## 2015-07-07 NOTE — ED Provider Notes (Signed)
Patient care given to me by Fayrene Helper, PA-C at end of shift.   She had recent jugular lymph node resection (Dr. Pollyann Kennedy). Here for increased pain and swelling since surgery. CT scan negative for acute finding. She is receiving abx, fluids. PA Laveda Norman spoke to Dr. Lazarus Salines who advises she is safe for discharge and office follow up tomorrow.   During antibiotic (Vanc) administration the patient developed scalp itching and redness, moving into facial area. No other rash. Abx stopped. Benadryl ordered. She reports increased swelling to surgical site. Will continue to monitor.   She is feeling better after Benadryl - no further itching or redness. Abx stopped after initial dose. She is stable for discharge home and is strongly encouraged to see her surgeon later today.  Elpidio Anis, PA-C 07/07/15 [id number]  Tomasita Crumble, MD 07/07/15 [phone]

## 2015-07-07 NOTE — ED Notes (Signed)
EKG given to EDP, Walden,MD., for review. 

## 2015-07-07 NOTE — ED Notes (Signed)
Pt with rash and itching after receiving vancomycin infusion. Given benadryl. MD aware.

## 2015-07-07 NOTE — Discharge Instructions (Signed)
You have swelling and redness to the surgical site on your neck. Please call and follow up with your neck surgeon today for close follow up of your neck pain.  Continue with pain medication and antibiotic as previously prescribed.

## 2015-07-09 LAB — URINE CULTURE

## 2015-07-12 LAB — CULTURE, BLOOD (ROUTINE X 2)
CULTURE: NO GROWTH
Culture: NO GROWTH

## 2015-08-14 ENCOUNTER — Encounter (HOSPITAL_COMMUNITY): Payer: Self-pay | Admitting: *Deleted

## 2015-08-14 ENCOUNTER — Emergency Department (HOSPITAL_COMMUNITY): Payer: Medicaid Other

## 2015-08-14 ENCOUNTER — Emergency Department (HOSPITAL_COMMUNITY)
Admission: EM | Admit: 2015-08-14 | Discharge: 2015-08-15 | Discharge status: Against Medical Advice | Payer: Medicaid Other | Attending: Emergency Medicine | Admitting: Emergency Medicine

## 2015-08-14 DIAGNOSIS — R200 Anesthesia of skin: Secondary | ICD-10-CM | POA: Insufficient documentation

## 2015-08-14 DIAGNOSIS — R079 Chest pain, unspecified: Secondary | ICD-10-CM | POA: Diagnosis present

## 2015-08-14 LAB — CBC
HCT: 35.9 % — ABNORMAL LOW (ref 36.0–46.0)
HEMOGLOBIN: 11.4 g/dL — AB (ref 12.0–15.0)
MCH: 19.5 pg — AB (ref 26.0–34.0)
MCHC: 31.8 g/dL (ref 30.0–36.0)
MCV: 61.3 fL — AB (ref 78.0–100.0)
PLATELETS: 270 10*3/uL (ref 150–400)
RBC: 5.86 MIL/uL — AB (ref 3.87–5.11)
RDW: 15.8 % — ABNORMAL HIGH (ref 11.5–15.5)
WBC: 9 10*3/uL (ref 4.0–10.5)

## 2015-08-14 LAB — BASIC METABOLIC PANEL
Anion gap: 9 (ref 5–15)
BUN: 13 mg/dL (ref 6–20)
CHLORIDE: 104 mmol/L (ref 101–111)
CO2: 24 mmol/L (ref 22–32)
CREATININE: 0.69 mg/dL (ref 0.44–1.00)
Calcium: 9.1 mg/dL (ref 8.9–10.3)
GFR calc Af Amer: >60 mL/min (ref >60)
GFR calc non Af Amer: >60 mL/min (ref >60)
Glucose, Bld: 95 mg/dL (ref 65–99)
Potassium: 3.6 mmol/L (ref 3.5–5.1)
Sodium: 137 mmol/L (ref 135–145)

## 2015-08-14 LAB — I-STAT TROPONIN, ED: TROPONIN I, POC: 0 ng/mL (ref 0.00–0.08)

## 2015-08-14 NOTE — ED Notes (Signed)
Pt states she felt chest pain starting 2 hours ago while walking in the store. Pt states her left arm started feeling numb/tingling 30 minutes ago. Pt states she now has decreased sensation in her left face and extremities. Pt states she had similar symptoms 1 year ago but is not sure what she was diagnosed with.

## 2015-08-15 NOTE — ED Notes (Signed)
Pt was called twice, no answer 

## 2015-08-30 ENCOUNTER — Emergency Department (HOSPITAL_COMMUNITY)
Admission: EM | Admit: 2015-08-30 | Discharge: 2015-08-30 | Disposition: A | Discharge status: Home / Self Care | Payer: Medicaid Other | Attending: Emergency Medicine | Admitting: Emergency Medicine

## 2015-08-30 ENCOUNTER — Encounter (HOSPITAL_COMMUNITY): Payer: Self-pay | Admitting: Emergency Medicine

## 2015-08-30 DIAGNOSIS — Z79899 Other long term (current) drug therapy: Secondary | ICD-10-CM | POA: Diagnosis not present

## 2015-08-30 DIAGNOSIS — Z79818 Long term (current) use of other agents affecting estrogen receptors and estrogen levels: Secondary | ICD-10-CM | POA: Insufficient documentation

## 2015-08-30 DIAGNOSIS — S90862A Insect bite (nonvenomous), left foot, initial encounter: Secondary | ICD-10-CM | POA: Diagnosis present

## 2015-08-30 DIAGNOSIS — W57XXXA Bitten or stung by nonvenomous insect and other nonvenomous arthropods, initial encounter: Secondary | ICD-10-CM | POA: Diagnosis not present

## 2015-08-30 DIAGNOSIS — Z880 Allergy status to penicillin: Secondary | ICD-10-CM | POA: Diagnosis not present

## 2015-08-30 DIAGNOSIS — Z862 Personal history of diseases of the blood and blood-forming organs and certain disorders involving the immune mechanism: Secondary | ICD-10-CM | POA: Diagnosis not present

## 2015-08-30 DIAGNOSIS — Y998 Other external cause status: Secondary | ICD-10-CM | POA: Diagnosis not present

## 2015-08-30 DIAGNOSIS — Y9289 Other specified places as the place of occurrence of the external cause: Secondary | ICD-10-CM | POA: Insufficient documentation

## 2015-08-30 DIAGNOSIS — Z7951 Long term (current) use of inhaled steroids: Secondary | ICD-10-CM | POA: Diagnosis not present

## 2015-08-30 DIAGNOSIS — Z8619 Personal history of other infectious and parasitic diseases: Secondary | ICD-10-CM | POA: Insufficient documentation

## 2015-08-30 DIAGNOSIS — S90861A Insect bite (nonvenomous), right foot, initial encounter: Secondary | ICD-10-CM | POA: Diagnosis not present

## 2015-08-30 DIAGNOSIS — Y9389 Activity, other specified: Secondary | ICD-10-CM | POA: Insufficient documentation

## 2015-08-30 DIAGNOSIS — Z87891 Personal history of nicotine dependence: Secondary | ICD-10-CM | POA: Diagnosis not present

## 2015-08-30 DIAGNOSIS — Z792 Long term (current) use of antibiotics: Secondary | ICD-10-CM | POA: Diagnosis not present

## 2015-08-30 MED ORDER — METHYLPREDNISOLONE SODIUM SUCC 125 MG IJ SOLR
125.0000 mg | Freq: Once | INTRAMUSCULAR | Status: AC
  Administered 2015-08-30: 125 mg via INTRAMUSCULAR
  Filled 2015-08-30: qty 2

## 2015-08-30 MED ORDER — PREDNISONE 20 MG PO TABS: 40.0000 mg | ORAL_TABLET | Freq: Every day | ORAL | Status: DC

## 2015-08-30 MED ORDER — DIPHENHYDRAMINE HCL 25 MG PO CAPS
25.0000 mg | ORAL_CAPSULE | Freq: Once | ORAL | Status: AC
  Administered 2015-08-30: 25 mg via ORAL
  Filled 2015-08-30: qty 1

## 2015-08-30 MED ORDER — DIPHENHYDRAMINE HCL 25 MG PO TABS: 25.0000 mg | ORAL_TABLET | Freq: Four times a day (QID) | ORAL | Status: DC | PRN

## 2015-08-30 NOTE — ED Notes (Signed)
PA at bedside.
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
## 2015-08-30 NOTE — Discharge Instructions (Signed)
Take the prescribed medication as directed-- start steroids tomorrow, you have already had today's dose. Follow-up with your primary care physician. Return to the ED for new or worsening symptoms.

## 2015-08-30 NOTE — ED Provider Notes (Signed)
CSN: [id number]     Arrival date & time 08/30/15  1722 History   By signing my name below, I, Freida Busman, attest that this documentation has been prepared under the direction and in the presence of non-physician practitioner, Sharilyn Sites, PA-C. Electronically Signed: Freida Busman, Scribe. 08/30/2015. 5:46 PM.  Chief Complaint  Patient presents with  . Insect Bite   The history is provided by the patient. No language interpreter was used.     HPI Comments:  Winston M Ladona Ridgel is a 34 y.o. female who presents to the Emergency Department complaining of constant burning pain to her bilateral feet for 2 weeks. She notes she was bitten by multiple ants ~ 2 weeks ago. She has been experiencing the pain since; notes the pain is worse at night. She reports associated itching to the site.  Pt has seen her PCP for her symptoms and was given benadryl cream and hydrocortisone cream which she has applied without relief. No alleviating factors or associated symptoms noted.  No fever, chills.  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  Patient tachycardic, consistently scratching her feet.   Past Medical History  Diagnosis Date  . Anemia   . Chlamydia    Past Surgical History  Procedure Laterality Date  . Cesarean section    . Appendectomy    . Tonsillectomy    . Cesarean section      x4  . Tubal ligation    . Cholecystectomy N/A 12/24/2014    Procedure: LAPAROSCOPIC CHOLECYSTECTOMY WITH INTRAOPERATIVE CHOLANGIOGRAM;  Surgeon: Glenna Fellows, MD;  Location: [facility];  Service: General;  Laterality: N/A;  . Adenoidectomy     Family History  Problem Relation Age of Onset  . Diabetes Mother   . Hypertension Mother   . CAD Mother    Social History  Substance Use Topics  . Smoking status: Former Smoker -- 0.50 packs/day    Types: Cigarettes    Quit date: 04/25/2014  . Smokeless tobacco: Never Used  . Alcohol Use: No     Comment: former   OB History    Gravida Para Term Preterm AB TAB SAB  Ectopic Multiple Living   Review of Systems  Constitutional: Negative for fever and chills.  Skin: Positive for rash.  All other systems reviewed and are negative.  Allergies  Augmentin; Chantix; Percocet; and Penicillins  Home Medications   Prior to Admission medications   Medication Sig Start Date End Date Taking? Authorizing Provider  albuterol (PROVENTIL HFA;VENTOLIN HFA) 108 (90 BASE) MCG/ACT inhaler Inhale 2 puffs into the lungs every 4 (four) hours as needed for wheezing or shortness of breath. 07/25/14   Shon Baton, MD  cephALEXin (KEFLEX) 500 MG capsule Take 1 capsule (500 mg total) by mouth 3 (three) times daily. Patient not taking: Reported on 07/06/2015 03/16/15   Garlon Hatchet, PA-C  cetirizine (ZYRTEC) 10 MG tablet Take 10 mg by mouth daily as needed for allergies.  03/24/15   Historical Provider, MD  clindamycin (CLEOCIN) 300 MG capsule Take 1 capsule (300 mg total) by mouth 4 (four) times daily. X 7 days Patient not taking: Reported on 05/04/2015 04/27/15   Geoffery Lyons, MD  CLINDAMYCIN HCL PO Take 300 mg by mouth 3 (three) times daily.     Historical Provider, MD  EPINEPHrine (EPIPEN 2-PAK) 0.3 mg/0.3 mL IJ SOAJ injection Inject 1 Dose as directed as needed. 08/19/14  Historical Provider, MD  famotidine (PEPCID) 20 MG tablet Take 1 tablet (20 mg total) by mouth 2 (two) times daily. Patient not taking: Reported on 12/22/2014 12/14/14   Laurieann Palumbo, MD  FLUoxetine (PROZAC) 20 MG capsule Take 40 mg by mouth at bedtime.     Historical Provider, MD  HYDROcodone-acetaminophen (NORCO/VICODIN) 5-325 MG per tablet Take 1 tablet by mouth every 6 (six) hours as needed for moderate pain. 05/05/15   Charlestine Night, PA-C  ibuprofen (ADVIL,MOTRIN) 800 MG tablet Take 1 tablet (800 mg total) by mouth every 8 (eight) hours as needed. 05/05/15   Charlestine Night, PA-C  Levonorgestrel-Ethinyl Estradiol (SEASONIQUE) 0.15-0.03 &0.01 MG tablet Take 1 tablet by mouth  daily.      Historical Provider, MD  loratadine (CLARITIN) 10 MG tablet Take 1 tablet (10 mg total) by mouth daily. 07/25/14   Shon Baton, MD  methocarbamol (ROBAXIN) 500 MG tablet Take 1 tablet (500 mg total) by mouth 2 (two) times daily. Patient not taking: Reported on 07/06/2015 04/26/15   Earley Favor, NP  montelukast (SINGULAIR) 10 MG tablet Take 10 mg by mouth at bedtime.    Historical Provider, MD  Olopatadine HCl (PATADAY) 0.2 % SOLN Place 2 drops into both eyes as needed (allergies).  08/19/14   Historical Provider, MD  omeprazole (PRILOSEC) 40 MG capsule Take 40 mg by mouth daily. 08/19/14   Historical Provider, MD  ondansetron (ZOFRAN ODT) 4 MG disintegrating tablet Take 1 tablet (4 mg total) by mouth every 8 (eight) hours as needed for nausea or vomiting. Patient not taking: Reported on 12/14/2014 10/17/14   Kaitlyn Szekalski, PA-C  PATANASE 0.6 % SOLN Place 2 sprays into the nose 2 (two) times daily. 03/27/15   Historical Provider, MD  QVAR 80 MCG/ACT inhaler Inhale 2 puffs into the lungs 2 (two) times daily. 03/24/15   Historical Provider, MD  rOPINIRole (REQUIP) 1 MG tablet Take 1 mg by mouth at bedtime.    Historical Provider, MD  traZODone (DESYREL) 100 MG tablet Take 50-100 mg by mouth at bedtime. 04/04/15   Historical Provider, MD   BP 143/96 mmHg  Pulse 118  Temp(Src) 97.9 F (36.6 C) (Oral)  Resp 18  Ht 5' 6.5" (1.689 m)  Wt 255 lb (115.667 kg)  BMI 40.55 kg/m2  SpO2 100%   Physical Exam  Constitutional: She is oriented to person, place, and time. She appears well-developed and well-nourished. No distress.  HENT:  Head: Normocephalic and atraumatic.  Mouth/Throat: Oropharynx is clear and moist.  No oral swelling, airway widely patent  Eyes: Conjunctivae and EOM are normal. Pupils are equal, round, and reactive to light.  Neck: Normal range of motion. Neck supple.  Cardiovascular: Normal rate, regular rhythm and normal heart sounds.   Pulmonary/Chest: Effort normal and  breath sounds normal. No respiratory distress. She has no wheezes.  Abdominal: Soft. Bowel sounds are normal. There is no tenderness. There is no guarding.  Musculoskeletal: Normal range of motion. She exhibits no edema.  Multiple bites noted to bilateral feet, more concentrated on right foot; areas of skin excoriation noted and patient consistently scratching feet; no signs of superimposed infection or cellulitis; no drainage  Neurological: She is alert and oriented to person, place, and time.  Skin: Skin is warm and dry. She is not diaphoretic.  Psychiatric: She has a normal mood and affect.  Nursing note and vitals reviewed.   ED Course  Procedures   DIAGNOSTIC STUDIES:  Oxygen Saturation is 100% on RA, normal  by my interpretation.    COORDINATION OF CARE:  5:43 PM Discussed treatment plan with pt at bedside and pt agreed to plan.  Labs Review Labs Reviewed - No data to display  Imaging Review No results found.    EKG Interpretation None      MDM   Final diagnoses:  Insect bites   34 year old female here with multiple and bites to her feet for the past 2 weeks.  Patient is afebrile, nontoxic. She is tachycardic which I suspect is due to her constant scratching of her feet and fidgeting in chair.  She has multiple bites noted to her feet, signs of excoriation without superimposed infection or cellulitis. No airway compromise.  Patient has used topical creams only. I recommended oral Benadryl for antihistamine effect as well as prednisone taper. May continue topical meds if needed.  Patient will follow-up with her PCP.  Discussed plan with patient, he/she acknowledged understanding and agreed with plan of care.  Return precautions given for new or worsening symptoms.  I personally performed the services described in this documentation, which was scribed in my presence. The recorded information has been reviewed and is accurate.  Garlon Hatchet, PA-C 08/30/15 1843  Marily Memos, MD 08/31/15 [id number]

## 2015-08-31 ENCOUNTER — Encounter (HOSPITAL_COMMUNITY): Payer: Self-pay | Admitting: Emergency Medicine

## 2015-08-31 ENCOUNTER — Emergency Department (HOSPITAL_COMMUNITY)
Admission: EM | Admit: 2015-08-31 | Discharge: 2015-08-31 | Discharge status: Against Medical Advice | Payer: Medicaid Other | Attending: Emergency Medicine | Admitting: Emergency Medicine

## 2015-08-31 DIAGNOSIS — H53149 Visual discomfort, unspecified: Secondary | ICD-10-CM | POA: Insufficient documentation

## 2015-08-31 DIAGNOSIS — Z8679 Personal history of other diseases of the circulatory system: Secondary | ICD-10-CM | POA: Diagnosis not present

## 2015-08-31 DIAGNOSIS — R519 Headache, unspecified: Secondary | ICD-10-CM

## 2015-08-31 DIAGNOSIS — Z793 Long term (current) use of hormonal contraceptives: Secondary | ICD-10-CM | POA: Insufficient documentation

## 2015-08-31 DIAGNOSIS — Z880 Allergy status to penicillin: Secondary | ICD-10-CM | POA: Diagnosis not present

## 2015-08-31 DIAGNOSIS — Z7951 Long term (current) use of inhaled steroids: Secondary | ICD-10-CM | POA: Insufficient documentation

## 2015-08-31 DIAGNOSIS — Z7952 Long term (current) use of systemic steroids: Secondary | ICD-10-CM | POA: Diagnosis not present

## 2015-08-31 DIAGNOSIS — R110 Nausea: Secondary | ICD-10-CM | POA: Diagnosis not present

## 2015-08-31 DIAGNOSIS — R51 Headache: Secondary | ICD-10-CM | POA: Insufficient documentation

## 2015-08-31 DIAGNOSIS — Z87891 Personal history of nicotine dependence: Secondary | ICD-10-CM | POA: Insufficient documentation

## 2015-08-31 DIAGNOSIS — Z8619 Personal history of other infectious and parasitic diseases: Secondary | ICD-10-CM | POA: Insufficient documentation

## 2015-08-31 DIAGNOSIS — Z792 Long term (current) use of antibiotics: Secondary | ICD-10-CM | POA: Insufficient documentation

## 2015-08-31 DIAGNOSIS — Z862 Personal history of diseases of the blood and blood-forming organs and certain disorders involving the immune mechanism: Secondary | ICD-10-CM | POA: Insufficient documentation

## 2015-08-31 DIAGNOSIS — Z79899 Other long term (current) drug therapy: Secondary | ICD-10-CM | POA: Insufficient documentation

## 2015-08-31 DIAGNOSIS — R202 Paresthesia of skin: Secondary | ICD-10-CM | POA: Insufficient documentation

## 2015-08-31 LAB — CBC
HEMATOCRIT: 37.1 % (ref 36.0–46.0)
HEMOGLOBIN: 11.8 g/dL — AB (ref 12.0–15.0)
MCH: 19.5 pg — ABNORMAL LOW (ref 26.0–34.0)
MCHC: 31.8 g/dL (ref 30.0–36.0)
MCV: 61.4 fL — AB (ref 78.0–100.0)
Platelets: 316 10*3/uL (ref 150–400)
RBC: 6.04 MIL/uL — ABNORMAL HIGH (ref 3.87–5.11)
RDW: 16.2 % — AB (ref 11.5–15.5)
WBC: 10.3 10*3/uL (ref 4.0–10.5)

## 2015-08-31 LAB — BASIC METABOLIC PANEL
ANION GAP: 10 (ref 5–15)
BUN: 12 mg/dL (ref 6–20)
CHLORIDE: 108 mmol/L (ref 101–111)
CO2: 20 mmol/L — ABNORMAL LOW (ref 22–32)
Calcium: 9.7 mg/dL (ref 8.9–10.3)
Creatinine, Ser: 0.69 mg/dL (ref 0.44–1.00)
GFR calc Af Amer: >60 mL/min (ref >60)
GFR calc non Af Amer: >60 mL/min (ref >60)
Glucose, Bld: 116 mg/dL — ABNORMAL HIGH (ref 65–99)
POTASSIUM: 4.1 mmol/L (ref 3.5–5.1)
Sodium: 138 mmol/L (ref 135–145)

## 2015-08-31 LAB — I-STAT BETA HCG BLOOD, ED (MC, WL, AP ONLY): I-stat hCG, quantitative: <5 m[IU]/mL (ref <5)

## 2015-08-31 LAB — I-STAT TROPONIN, ED: Troponin i, poc: 0 ng/mL (ref 0.00–0.08)

## 2015-08-31 MED ORDER — SODIUM CHLORIDE 0.9 % IV BOLUS (SEPSIS)
1000.0000 mL | Freq: Once | INTRAVENOUS | Status: AC
  Administered 2015-08-31: 1000 mL via INTRAVENOUS

## 2015-08-31 MED ORDER — METOCLOPRAMIDE HCL 5 MG/ML IJ SOLN
10.0000 mg | Freq: Once | INTRAMUSCULAR | Status: AC
  Administered 2015-08-31: 10 mg via INTRAVENOUS
  Filled 2015-08-31: qty 2

## 2015-08-31 MED ORDER — KETOROLAC TROMETHAMINE 30 MG/ML IJ SOLN
30.0000 mg | Freq: Once | INTRAMUSCULAR | Status: AC
  Administered 2015-08-31: 30 mg via INTRAVENOUS
  Filled 2015-08-31: qty 1

## 2015-08-31 MED ORDER — DIPHENHYDRAMINE HCL 50 MG/ML IJ SOLN
25.0000 mg | Freq: Once | INTRAMUSCULAR | Status: AC
  Administered 2015-08-31: 25 mg via INTRAVENOUS
  Filled 2015-08-31: qty 1

## 2015-08-31 NOTE — ED Notes (Signed)
Pt sts episodes of feeling lightheaded with palpitations and CP recently; pt sts htn this am

## 2015-08-31 NOTE — ED Provider Notes (Signed)
CSN: [id number]     Arrival date & time 08/31/15  1137 History   First MD Initiated Contact with Patient 08/31/15 1157     Chief Complaint  Patient presents with  . Hypertension     (Consider location/radiation/quality/duration/timing/severity/associated sxs/prior Treatment) HPI Comments: Patient is a 34 year old female with a past medical history of migraines who presents with a migraine that started today. Patient reports a gradual onset and progressive worsening of the headache. The pain is sharp, constant and is located in generalized head without radiation. Patient has tried nothing for symptoms without relief. No alleviating/aggravating factors. Patient reports associated nausea, left side tingling, and photophobia. Patient reports this feels like her typical migraines and has had a CT angio in the past to evaluate the tingling. Patient denies fever, vomiting, diarrhea, weakness, visual changes, congestion, chest pain, SOB, abdominal pain.      Past Medical History  Diagnosis Date  . Anemia   . Chlamydia    Past Surgical History  Procedure Laterality Date  . Cesarean section    . Appendectomy    . Tonsillectomy    . Cesarean section      x4  . Tubal ligation    . Cholecystectomy N/A 12/24/2014    Procedure: LAPAROSCOPIC CHOLECYSTECTOMY WITH INTRAOPERATIVE CHOLANGIOGRAM;  Surgeon: Glenna Fellows, MD;  Location: [facility];  Service: General;  Laterality: N/A;  . Adenoidectomy     Family History  Problem Relation Age of Onset  . Diabetes Mother   . Hypertension Mother   . CAD Mother    Social History  Substance Use Topics  . Smoking status: Former Smoker -- 0.50 packs/day    Types: Cigarettes    Quit date: 04/25/2014  . Smokeless tobacco: Never Used  . Alcohol Use: No     Comment: former   OB History    Gravida Para Term Preterm AB TAB SAB Ectopic Multiple Living   5 5 3 2      3      Review of Systems  Neurological: Positive for numbness and headaches.  All  other systems reviewed and are negative.     Allergies  Augmentin; Chantix; Percocet; and Penicillins  Home Medications   Prior to Admission medications   Medication Sig Start Date End Date Taking? Authorizing Provider  albuterol (PROVENTIL HFA;VENTOLIN HFA) 108 (90 BASE) MCG/ACT inhaler Inhale 2 puffs into the lungs every 4 (four) hours as needed for wheezing or shortness of breath. 07/25/14   Shon Baton, MD  cephALEXin (KEFLEX) 500 MG capsule Take 1 capsule (500 mg total) by mouth 3 (three) times daily. Patient not taking: Reported on 07/06/2015 03/16/15   Garlon Hatchet, PA-C  cetirizine (ZYRTEC) 10 MG tablet Take 10 mg by mouth daily as needed for allergies.  03/24/15   Historical Provider, MD  clindamycin (CLEOCIN) 300 MG capsule Take 1 capsule (300 mg total) by mouth 4 (four) times daily. X 7 days Patient not taking: Reported on 05/04/2015 04/27/15   Geoffery Lyons, MD  CLINDAMYCIN HCL PO Take 300 mg by mouth 3 (three) times daily.     Historical Provider, MD  diphenhydrAMINE (BENADRYL) 25 MG tablet Take 1 tablet (25 mg total) by mouth every 6 (six) hours as needed for itching (Rash). 08/30/15   Garlon Hatchet, PA-C  EPINEPHrine (EPIPEN 2-PAK) 0.3 mg/0.3 mL IJ SOAJ injection Inject 1 Dose as directed as needed. 08/19/14   Historical Provider, MD  famotidine (PEPCID) 20 MG tablet Take 1 tablet (20  mg total) by mouth 2 (two) times daily. Patient not taking: Reported on 12/22/2014 12/14/14   Aarohi Palumbo, MD  FLUoxetine (PROZAC) 20 MG capsule Take 40 mg by mouth at bedtime.     Historical Provider, MD  HYDROcodone-acetaminophen (NORCO/VICODIN) 5-325 MG per tablet Take 1 tablet by mouth every 6 (six) hours as needed for moderate pain. 05/05/15   Charlestine Night, PA-C  ibuprofen (ADVIL,MOTRIN) 800 MG tablet Take 1 tablet (800 mg total) by mouth every 8 (eight) hours as needed. 05/05/15   Charlestine Night, PA-C  Levonorgestrel-Ethinyl Estradiol (SEASONIQUE) 0.15-0.03 &0.01 MG tablet Take 1  tablet by mouth daily.      Historical Provider, MD  loratadine (CLARITIN) 10 MG tablet Take 1 tablet (10 mg total) by mouth daily. 07/25/14   Shon Baton, MD  methocarbamol (ROBAXIN) 500 MG tablet Take 1 tablet (500 mg total) by mouth 2 (two) times daily. Patient not taking: Reported on 07/06/2015 04/26/15   Earley Favor, NP  montelukast (SINGULAIR) 10 MG tablet Take 10 mg by mouth at bedtime.    Historical Provider, MD  Olopatadine HCl (PATADAY) 0.2 % SOLN Place 2 drops into both eyes as needed (allergies).  08/19/14   Historical Provider, MD  omeprazole (PRILOSEC) 40 MG capsule Take 40 mg by mouth daily. 08/19/14   Historical Provider, MD  ondansetron (ZOFRAN ODT) 4 MG disintegrating tablet Take 1 tablet (4 mg total) by mouth every 8 (eight) hours as needed for nausea or vomiting. Patient not taking: Reported on 12/14/2014 10/17/14   Keighan Amezcua, PA-C  PATANASE 0.6 % SOLN Place 2 sprays into the nose 2 (two) times daily. 03/27/15   Historical Provider, MD  predniSONE (DELTASONE) 20 MG tablet Take 2 tablets (40 mg total) by mouth daily. Take 40 mg by mouth daily for 3 days, then  by mouth daily for 3 days, then  daily for 3 days 08/30/15   Garlon Hatchet, PA-C  QVAR 80 MCG/ACT inhaler Inhale 2 puffs into the lungs 2 (two) times daily. 03/24/15   Historical Provider, MD  rOPINIRole (REQUIP) 1 MG tablet Take 1 mg by mouth at bedtime.    Historical Provider, MD  traZODone (DESYREL) 100 MG tablet Take 50-100 mg by mouth at bedtime. 04/04/15   Historical Provider, MD   BP 131/57 mmHg  Pulse 111  Temp(Src) 98.3 F (36.8 C) (Oral)  Resp 28  SpO2 97% Physical Exam  Constitutional: She is oriented to person, place, and time. She appears well-developed and well-nourished. No distress.  HENT:  Head: Normocephalic and atraumatic.  Eyes: Conjunctivae and EOM are normal.  Neck: Normal range of motion.  Cardiovascular: Normal rate and regular rhythm.  Exam reveals no gallop and no friction rub.    No murmur heard. Pulmonary/Chest: Effort normal and breath sounds normal. She has no wheezes. She has no rales. She exhibits no tenderness.  Abdominal: Soft. She exhibits no distension. There is no tenderness. There is no rebound and no guarding.  Musculoskeletal: Normal range of motion.  Neurological: She is alert and oriented to person, place, and time. No cranial nerve deficit. Coordination normal.  Speech is goal-oriented. Moves limbs without ataxia.   Skin: Skin is warm and dry.  Psychiatric: She has a normal mood and affect. Her behavior is normal.  Nursing note and vitals reviewed.   ED Course  Procedures (including critical care time) Labs Review Labs Reviewed  BASIC METABOLIC PANEL - Abnormal; Notable for the following:    CO2 20 (*)  Glucose, Bld 116 (*)    All other components within normal limits  CBC - Abnormal; Notable for the following:    RBC 6.04 (*)    Hemoglobin 11.8 (*)    MCV 61.4 (*)    MCH 19.5 (*)    RDW 16.2 (*)    All other components within normal limits  I-STAT BETA HCG BLOOD, ED ([facility], [facility], [facility] ONLY)  I-STAT TROPOININ, ED    Imaging Review No results found. I have personally reviewed and evaluated these images and lab results as part of my medical decision-making.   EKG Interpretation None      MDM   Final diagnoses:  Nonintractable headache, unspecified chronicity pattern, unspecified headache type    3:31 PM No neuro deficits noted at this time. Patient will be treated for a complicated migraine. Patient left before work up complete.    Emilia Beck, PA-C 08/31/15 1542  Vanetta Mulders, MD 09/01/15 2203

## 2015-08-31 NOTE — ED Notes (Signed)
Pt states must leave ASAP, reports son is sick at school.  Pt states sister is coming to get her, pt is not driving.  Pt willing to sign out AMA, unwilling to wait to speak with EDP.

## 2015-08-31 NOTE — ED Notes (Signed)
PA at bedside.

## 2015-09-19 ENCOUNTER — Encounter: Payer: Self-pay | Admitting: Neurology

## 2015-09-19 ENCOUNTER — Ambulatory Visit (INDEPENDENT_AMBULATORY_CARE_PROVIDER_SITE_OTHER): Payer: Medicaid Other | Admitting: Neurology

## 2015-09-19 VITALS — BP 123/81 | HR 95 | Resp 16 | Ht 66.5 in | Wt 266.0 lb

## 2015-09-19 DIAGNOSIS — IMO0002 Reserved for concepts with insufficient information to code with codable children: Secondary | ICD-10-CM

## 2015-09-19 DIAGNOSIS — G43709 Chronic migraine without aura, not intractable, without status migrainosus: Secondary | ICD-10-CM

## 2015-09-19 DIAGNOSIS — R202 Paresthesia of skin: Secondary | ICD-10-CM | POA: Diagnosis not present

## 2015-09-19 MED ORDER — RIZATRIPTAN BENZOATE 5 MG PO TBDP: 5.0000 mg | ORAL_TABLET | ORAL | Status: DC | PRN

## 2015-09-19 MED ORDER — TOPIRAMATE 100 MG PO TABS: ORAL_TABLET | ORAL | Status: DC

## 2015-09-19 NOTE — Progress Notes (Signed)
PATIENT: Crystal Myers DOB: [birth year]  Chief Complaint  Patient presents with  . Migraine    RM 5.  H/O migraines. Recently migraines have been worse on L side. Has had symptoms of L sided weakness, numbness, and possible synopal episode.   . Numbness     HISTORICAL  Crystal Myers is a 34 years old right-handed female, seen in refer by her primary care physician Dr. Everrett Coombe in September 19 2015 for evaluation of frequent headaches  I reviewed and summarized her most recent office note   She reported long history of headaches, since elementary school, her typical migraines are lateralized severe pounding headache with associated light noise sensitivity nausea, lasting for a few hours, since August 2016, she began to have frequent headaches, there was also one episode, during intense headaches, she passed out, also complained of chest pain, there was also reported left-sided numbness, droopiness, she was taken to the emergency room,  CAT scan of the brain without contrast October 2016 was reported normal per record, normal CBC, CMP, chest x-ray,  I have personally reviewed the reviewed MRI of the brain 2014 that was normal,  Since then, she began to have almost daily moderate to severe headaches, she also reported left frontal area tenderness to touch, persistent left side paresthesia, hypersensitivity.  She has tried different preventive medications in the past, Topamax, did not help, Abortive treatment, she has tried over-the-counter ibuprofen Tylenol and Aleve, Excedrin Migraine, does not help either.  She has strong family history of migraine, her sister and uncle received Botox injection as migraine prevention  REVIEW OF SYSTEMS: Full 14 system review of systems performed and notable only for chills, eye redness, light sensitivity, double vision, blurry vision, diarrhea, nausea, dizziness, tremor  ALLERGIES: Allergies  Allergen Reactions  . Amoxicillin Itching  .  Augmentin [Amoxicillin-Pot Clavulanate] Itching  . Chantix [Varenicline] Other (See Comments)    Nightmare   . Percocet [Oxycodone-Acetaminophen] Itching  . Penicillins Rash    HOME MEDICATIONS: Current Outpatient Prescriptions  Medication Sig Dispense Refill  . albuterol (PROVENTIL HFA;VENTOLIN HFA) 108 (90 BASE) MCG/ACT inhaler Inhale 2 puffs into the lungs every 4 (four) hours as needed for wheezing or shortness of breath. 1 Inhaler 0  . cephALEXin (KEFLEX) 500 MG capsule Take 1 capsule (500 mg total) by mouth 3 (three) times daily. 21 capsule 0  . cetirizine (ZYRTEC) 10 MG tablet Take 10 mg by mouth daily as needed for allergies.   3  . EPINEPHrine (EPIPEN 2-PAK) 0.3 mg/0.3 mL IJ SOAJ injection Inject 1 Dose as directed as needed.    . famotidine (PEPCID) 20 MG tablet Take 1 tablet (20 mg total) by mouth 2 (two) times daily. 10 tablet 0  . FLUoxetine (PROZAC) 20 MG capsule Take 40 mg by mouth at bedtime.     . Levonorgestrel-Ethinyl Estradiol (SEASONIQUE) 0.15-0.03 &0.01 MG tablet Take 1 tablet by mouth daily.      Marland Kitchen loratadine (CLARITIN) 10 MG tablet Take 1 tablet (10 mg total) by mouth daily. 30 tablet 0  . montelukast (SINGULAIR) 10 MG tablet Take 10 mg by mouth at bedtime.    . Olopatadine HCl (PATADAY) 0.2 % SOLN Place 2 drops into both eyes as needed (allergies).     Marland Kitchen omeprazole (PRILOSEC) 40 MG capsule Take 40 mg by mouth daily.    Marland Kitchen PATANASE 0.6 % SOLN Place 2 sprays into the nose 2 (two) times daily.  6  . phentermine (ADIPEX-P) 37.5 MG  tablet TK 1 T PO QAM 30 MINUTES BEFORE BREAKFAST  0  . QVAR 80 MCG/ACT inhaler Inhale 2 puffs into the lungs 2 (two) times daily.  3  . rOPINIRole (REQUIP) 1 MG tablet Take 1 mg by mouth at bedtime.    . traZODone (DESYREL) 100 MG tablet Take 50-100 mg by mouth at bedtime.  1     PAST MEDICAL HISTORY: Past Medical History  Diagnosis Date  . Anemia   . Chlamydia   . Headache   . Depression   . Anxiety     PAST SURGICAL  HISTORY: Past Surgical History  Procedure Laterality Date  . Cesarean section    . Appendectomy    . Tonsillectomy    . Cesarean section      x4  . Tubal ligation    . Cholecystectomy N/A 12/24/2014    Procedure: LAPAROSCOPIC CHOLECYSTECTOMY WITH INTRAOPERATIVE CHOLANGIOGRAM;  Surgeon: Glenna Fellows, MD;  Location: [facility];  Service: General;  Laterality: N/A;  . Adenoidectomy    . Lymphadenectomy      FAMILY HISTORY: Family History  Problem Relation Age of Onset  . Diabetes Mother   . Hypertension Mother   . CAD Mother     SOCIAL HISTORY:  Social History   Social History  . Marital Status: Single    Spouse Name: N/A  . Number of Children: 4  . Years of Education: HS   Occupational History  . N/A    Social History Main Topics  . Smoking status: Former Smoker -- 0.50 packs/day    Types: Cigarettes    Quit date: 11/25/2014  . Smokeless tobacco: Never Used  . Alcohol Use: No     Comment: former  . Drug Use: No  . Sexual Activity: Yes    Birth Control/ Protection: None, Surgical   Other Topics Concern  . Not on file   Social History Narrative   Drinks about 1 soda a week      PHYSICAL EXAM   Filed Vitals:   09/19/15 1339  BP: 123/81  Pulse: 95  Resp: 16  Height: 5' 6.5" (1.689 m)  Weight: 266 lb (120.657 kg)    Not recorded      Body mass index is 42.3 kg/(m^2).  PHYSICAL EXAMNIATION:  Gen: NAD, conversant, well nourised, obese, well groomed                     Cardiovascular: Regular rate rhythm, no peripheral edema, warm, nontender. Eyes: Conjunctivae clear without exudates or hemorrhage Neck: Supple, no carotid bruise. Pulmonary: Clear to auscultation bilaterally   NEUROLOGICAL EXAM:  MENTAL STATUS: Speech:    Speech is normal; fluent and spontaneous with normal comprehension.  Cognition:     Orientation to time, place and person     Normal recent and remote memory     Normal Attention span and concentration     Normal Language,  naming, repeating,spontaneous speech     Fund of knowledge   CRANIAL NERVES: CN II: Visual fields are full to confrontation. Fundoscopic exam is normal with sharp discs and no vascular changes. Pupils are round equal and briskly reactive to light. CN III, IV, VI: extraocular movement are normal. No ptosis. CN V: Facial sensation is intact to pinprick in all 3 divisions bilaterally. Corneal responses are intact.  CN VII: Face is symmetric with normal eye closure and smile. CN VIII: Hearing is normal to rubbing fingers CN IX, X: Palate elevates symmetrically. Phonation is normal.  CN XI: Head turning and shoulder shrug are intact CN XII: Tongue is midline with normal movements and no atrophy.  MOTOR: There is no pronator drift of out-stretched arms. Muscle bulk and tone are normal. Muscle strength is normal.  REFLEXES: Reflexes are 2+ and symmetric at the biceps, triceps, knees, and ankles. Plantar responses are flexor.  SENSORY: Intact to light touch, pinprick, position sense, and vibration sense are intact in fingers and toes, patient reported different sensation on the left side of her body, left arm and left leg.Marland Kitchen.  COORDINATION: Rapid alternating movements and fine finger movements are intact. There is no dysmetria on finger-to-nose and heel-knee-shin.    GAIT/STANCE: Posture is normal. Gait is steady with normal steps, base, arm swing, and turning. Heel and toe walking are normal. Tandem gait is normal.  Romberg is absent.   DIAGNOSTIC DATA (LABS, IMAGING, TESTING) - I reviewed patient records, labs, notes, testing and imaging myself where available.   ASSESSMENT AND PLAN  Crystal Myers is a 34 y.o. female   Frequent migraine headaches Left-sided paresthesia  MRI of the brain without contrast  Topamax 100 mg twice a day as migraine prevention  Maxalt 5 mg as needed  Return to clinic in one month   Levert FeinsteinYijun Christiaan Strebeck, M.D. Ph.D.  [facility] [address],  [address] [place], [place] [address] Ph: [phone] Fax: [phone]  CC: Everrett Coombeody Matthews, DO

## 2015-09-27 ENCOUNTER — Other Ambulatory Visit: Payer: Self-pay | Admitting: Neurology

## 2015-10-01 ENCOUNTER — Ambulatory Visit
Admission: RE | Admit: 2015-10-01 | Discharge: 2015-10-01 | Disposition: A | Discharge status: Home / Self Care | Payer: Medicaid Other | Source: Ambulatory Visit | Attending: Neurology | Admitting: Neurology

## 2015-10-01 DIAGNOSIS — G43709 Chronic migraine without aura, not intractable, without status migrainosus: Secondary | ICD-10-CM | POA: Diagnosis not present

## 2015-10-01 DIAGNOSIS — R202 Paresthesia of skin: Secondary | ICD-10-CM

## 2015-10-01 DIAGNOSIS — IMO0002 Reserved for concepts with insufficient information to code with codable children: Secondary | ICD-10-CM

## 2015-10-03 ENCOUNTER — Emergency Department (HOSPITAL_COMMUNITY): Payer: Medicaid Other

## 2015-10-03 ENCOUNTER — Emergency Department (HOSPITAL_COMMUNITY)
Admission: EM | Admit: 2015-10-03 | Discharge: 2015-10-03 | Disposition: A | Discharge status: Home / Self Care | Payer: Medicaid Other | Attending: Emergency Medicine | Admitting: Emergency Medicine

## 2015-10-03 ENCOUNTER — Encounter (HOSPITAL_COMMUNITY): Payer: Self-pay | Admitting: Emergency Medicine

## 2015-10-03 ENCOUNTER — Telehealth: Payer: Self-pay | Admitting: *Deleted

## 2015-10-03 DIAGNOSIS — Z87891 Personal history of nicotine dependence: Secondary | ICD-10-CM | POA: Insufficient documentation

## 2015-10-03 DIAGNOSIS — Z862 Personal history of diseases of the blood and blood-forming organs and certain disorders involving the immune mechanism: Secondary | ICD-10-CM | POA: Insufficient documentation

## 2015-10-03 DIAGNOSIS — J45901 Unspecified asthma with (acute) exacerbation: Secondary | ICD-10-CM | POA: Diagnosis not present

## 2015-10-03 DIAGNOSIS — Z79899 Other long term (current) drug therapy: Secondary | ICD-10-CM | POA: Diagnosis not present

## 2015-10-03 DIAGNOSIS — Z793 Long term (current) use of hormonal contraceptives: Secondary | ICD-10-CM | POA: Diagnosis not present

## 2015-10-03 DIAGNOSIS — Z8619 Personal history of other infectious and parasitic diseases: Secondary | ICD-10-CM | POA: Diagnosis not present

## 2015-10-03 DIAGNOSIS — H9209 Otalgia, unspecified ear: Secondary | ICD-10-CM | POA: Insufficient documentation

## 2015-10-03 DIAGNOSIS — Z7951 Long term (current) use of inhaled steroids: Secondary | ICD-10-CM | POA: Diagnosis not present

## 2015-10-03 DIAGNOSIS — F329 Major depressive disorder, single episode, unspecified: Secondary | ICD-10-CM | POA: Diagnosis not present

## 2015-10-03 DIAGNOSIS — Z880 Allergy status to penicillin: Secondary | ICD-10-CM | POA: Diagnosis not present

## 2015-10-03 DIAGNOSIS — J45909 Unspecified asthma, uncomplicated: Secondary | ICD-10-CM | POA: Diagnosis present

## 2015-10-03 DIAGNOSIS — F419 Anxiety disorder, unspecified: Secondary | ICD-10-CM | POA: Insufficient documentation

## 2015-10-03 MED ORDER — IPRATROPIUM-ALBUTEROL 0.5-2.5 (3) MG/3ML IN SOLN: 3.0000 mL | RESPIRATORY_TRACT | Status: AC | PRN

## 2015-10-03 MED ORDER — ALBUTEROL SULFATE HFA 108 (90 BASE) MCG/ACT IN AERS: 1.0000 | INHALATION_SPRAY | Freq: Four times a day (QID) | RESPIRATORY_TRACT | Status: DC | PRN

## 2015-10-03 MED ORDER — PREDNISONE 20 MG PO TABS: 60.0000 mg | ORAL_TABLET | Freq: Once | ORAL | Status: DC

## 2015-10-03 MED ORDER — PREDNISONE 50 MG PO TABS: ORAL_TABLET | ORAL | Status: DC

## 2015-10-03 MED ORDER — ALBUTEROL SULFATE (2.5 MG/3ML) 0.083% IN NEBU
5.0000 mg | INHALATION_SOLUTION | Freq: Once | RESPIRATORY_TRACT | Status: AC
  Administered 2015-10-03: 5 mg via RESPIRATORY_TRACT
  Filled 2015-10-03: qty 6

## 2015-10-03 NOTE — Telephone Encounter (Signed)
-----   Message from Levert FeinsteinYijun Yan, MD sent at 10/02/2015  4:22 PM EST ----- Please call pt no significant intracranial abnormality on MRI of the brain, evidence of mild chronic sinusitis

## 2015-10-03 NOTE — ED Notes (Signed)
Pt reports that her "throat and ears were sore this morning." Started feeling increasingly SOB today secondary to asthma exacerbation. Started new type of inhaler (Advair) today. Endorses chest pain (mid-sternal) which is non-radiating. RR even but labored. No other c/c. Denies any other s/s.

## 2015-10-03 NOTE — ED Provider Notes (Signed)
CSN: [id number]     Arrival date & time 10/03/15  2013 History   None    Chief Complaint  Patient presents with  . Shortness of Breath  . Asthma   HPI  Ms. Crystal Myers is a 34 year old female with PMHx of asthma, depression and anxiety presenting with asthma exacerbation. She reports that she has had multiple "mini asthma attacks" during the day today. She states that that attacks consist of shortness of breath, wheezing and chest tightness. She uses her rescue inhaler and avoids exertion during the attacks which improves her symptoms. She is also complaining of bilateral ear and throat soreness. She denies changes in hearing or ear drainage. She denies painful swallowing, difficulty tolerating PO or cough. She states that she is out of her home breathing treatments. Denies fevers, chills, headaches, dizziness, congestion, neck pain, chest pain, abdominal pain, nausea or vomiting.   Past Medical History  Diagnosis Date  . Anemia   . Chlamydia   . Headache   . Depression   . Anxiety    Past Surgical History  Procedure Laterality Date  . Cesarean section    . Appendectomy    . Tonsillectomy    . Cesarean section      x4  . Tubal ligation    . Cholecystectomy N/A 12/24/2014    Procedure: LAPAROSCOPIC CHOLECYSTECTOMY WITH INTRAOPERATIVE CHOLANGIOGRAM;  Surgeon: Glenna Fellows, MD;  Location: [facility];  Service: General;  Laterality: N/A;  . Adenoidectomy    . Lymphadenectomy     Family History  Problem Relation Age of Onset  . Diabetes Mother   . Hypertension Mother   . CAD Mother    Social History  Substance Use Topics  . Smoking status: Former Smoker -- 0.50 packs/day    Types: Cigarettes    Quit date: 11/25/2014  . Smokeless tobacco: Never Used  . Alcohol Use: No     Comment: former   OB History    Gravida Para Term Preterm AB TAB SAB Ectopic Multiple Living   Review of Systems  Constitutional: Negative for fever, chills and diaphoresis.  HENT:  Positive for ear pain and sore throat. Negative for congestion, ear discharge, hearing loss, rhinorrhea and trouble swallowing.   Eyes: Negative for discharge, redness and visual disturbance.  Respiratory: Positive for chest tightness, shortness of breath and wheezing. Negative for cough.   Cardiovascular: Negative for chest pain and palpitations.  Gastrointestinal: Negative for nausea, vomiting and abdominal pain.  Musculoskeletal: Negative for myalgias, arthralgias and neck pain.  Skin: Negative for rash.  Neurological: Negative for dizziness, syncope, weakness, light-headedness and headaches.  All other systems reviewed and are negative.     Allergies  Chantix; Percocet; and Penicillins  Home Medications   Prior to Admission medications   Medication Sig Start Date End Date Taking? Authorizing Provider  albuterol (PROVENTIL HFA;VENTOLIN HFA) 108 (90 BASE) MCG/ACT inhaler Inhale 2 puffs into the lungs every 4 (four) hours as needed for wheezing or shortness of breath. 07/25/14  Yes Shon Baton, MD  albuterol (PROVENTIL) (2.5 MG/3ML) 0.083% nebulizer solution Take 2.5 mg by nebulization every 6 (six) hours as needed for wheezing or shortness of breath.   Yes Historical Provider, MD  busPIRone (BUSPAR) 5 MG tablet Take 5 mg by mouth daily.   Yes Historical Provider, MD  cetirizine (ZYRTEC) 10 MG tablet Take 10 mg by mouth daily as needed for allergies.  03/24/15  Yes Historical Provider, MD  famotidine (PEPCID) 20 MG tablet Take 1 tablet (20 mg total) by mouth 2 (two) times daily. 12/14/14  Yes Jazzlene Palumbo, MD  FLUoxetine (PROZAC) 20 MG capsule Take 40 mg by mouth at bedtime.    Yes Historical Provider, MD  Fluticasone-Salmeterol (ADVAIR) 250-50 MCG/DOSE AEPB Inhale 1 puff into the lungs 2 (two) times daily.   Yes Historical Provider, MD  Levonorgestrel-Ethinyl Estradiol (SEASONIQUE) 0.15-0.03 &0.01 MG tablet Take 1 tablet by mouth daily.     Yes Historical Provider, MD  montelukast  (SINGULAIR) 10 MG tablet Take 10 mg by mouth at bedtime.   Yes Historical Provider, MD  Olopatadine HCl (PATADAY) 0.2 % SOLN Place 2 drops into both eyes as needed (allergies).  08/19/14  Yes Historical Provider, MD  omeprazole (PRILOSEC) 40 MG capsule Take 40 mg by mouth daily. 08/19/14  Yes Historical Provider, MD  PATANASE 0.6 % SOLN Place 2 sprays into the nose 2 (two) times daily. 03/27/15  Yes Historical Provider, MD  rOPINIRole (REQUIP) 1 MG tablet Take 1 mg by mouth at bedtime.   Yes Historical Provider, MD  topiramate (TOPAMAX) 100 MG tablet 1/2 tab po bid xone week then one po bid 09/19/15  Yes Levert FeinsteinYijun Yan, MD  traZODone (DESYREL) 100 MG tablet Take 50-100 mg by mouth at bedtime. 04/04/15  Yes Historical Provider, MD  albuterol (PROVENTIL HFA;VENTOLIN HFA) 108 (90 BASE) MCG/ACT inhaler Inhale 1-2 puffs into the lungs every 6 (six) hours as needed for wheezing or shortness of breath. 10/03/15   Crystal Lawhorn, PA-C  cephALEXin (KEFLEX) 500 MG capsule Take 1 capsule (500 mg total) by mouth 3 (three) times daily. Patient not taking: Reported on 10/03/2015 03/16/15   Garlon HatchetLisa M Sanders, PA-C  EPINEPHrine (EPIPEN 2-PAK) 0.3 mg/0.3 mL IJ SOAJ injection Inject 1 Dose as directed as needed. 08/19/14   Historical Provider, MD  ipratropium-albuterol (DUONEB) 0.5-2.5 (3) MG/3ML SOLN Take 3 mLs by nebulization every 2 (two) hours as needed. 10/03/15   Crystal Guard, PA-C  loratadine (CLARITIN) 10 MG tablet Take 1 tablet (10 mg total) by mouth daily. Patient not taking: Reported on 10/03/2015 07/25/14   Shon Batonourtney F Horton, MD  predniSONE (DELTASONE) 50 MG tablet Take one tablet a day for 5 days 10/03/15   Crystal GalaStevi Javayah Magaw, PA-C  rizatriptan (MAXALT-MLT) 5 MG disintegrating tablet Take 1 tablet (5 mg total) by mouth as needed. May repeat in 2 hours if needed 09/19/15   Levert FeinsteinYijun Yan, MD   BP 113/78 mmHg  Pulse 107  Temp(Src) 98.4 F (36.9 C) (Oral)  Resp 20  Ht 5\' 7"  (1.702 m)  Wt 267 lb (121.11 kg)  BMI 41.81 kg/m2  SpO2  100% Physical Exam  Constitutional: She appears well-developed and well-nourished. No distress.  HENT:  Head: Normocephalic and atraumatic.  Right Ear: Tympanic membrane, external ear and ear canal normal.  Left Ear: Tympanic membrane, external ear and ear canal normal.  Mouth/Throat: Uvula is midline, oropharynx is clear and moist and mucous membranes are normal. Mucous membranes are not dry. No oropharyngeal exudate, posterior oropharyngeal edema or posterior oropharyngeal erythema.  Eyes: Conjunctivae are normal. Right eye exhibits no discharge. Left eye exhibits no discharge. No scleral icterus.  Neck: Normal range of motion.  Cardiovascular: Normal rate, regular rhythm and normal heart sounds.   Pulmonary/Chest: Effort normal and breath sounds normal. No respiratory distress. She has no wheezes. She has no rales.  Pt had already received breathing treatment prior to initial interview. Per  nurse's report, pt had mildly labored breathing with diminished breath sounds but no wheezes. On my assessment, pt breathing unlabored. Lungs are clear to auscultation with normal air movement in all lung fields. No respiratory distress or accessory muscle use.   Abdominal: Soft. She exhibits no distension. There is no tenderness.  Musculoskeletal: Normal range of motion. She exhibits no edema or tenderness.  Moves extremities spontaneously. Walks around ED without dyspnea  Neurological: She is alert. Coordination normal.  Skin: Skin is warm and dry.  Psychiatric: She has a normal mood and affect. Her behavior is normal.  Nursing note and vitals reviewed.   ED Course  Procedures (including critical care time) Labs Review Labs Reviewed - No data to display  Imaging Review Dg Chest 2 View  10/03/2015  CLINICAL DATA:  Mid chest pain, shortness of breath and dry cough today. History of asthma. EXAM: CHEST  2 VIEW COMPARISON:  08/14/2015 FINDINGS: Increased central bronchitic change. The  cardiomediastinal contours are normal. Pulmonary vasculature is normal. No consolidation, pleural effusion, or pneumothorax. No acute osseous abnormalities are seen. IMPRESSION: Increased central bronchial thickening, may reflect asthma or bronchitis. Electronically Signed   By: Rubye Oaks M.D.   On: 10/03/2015 21:52   I have personally reviewed and evaluated these images and lab results as part of my medical decision-making.   EKG Interpretation   Date/Time:  Tuesday October 03 2015 20:23:51 EST Ventricular Rate:  120 PR Interval:  140 QRS Duration: 90 QT Interval:  333 QTC Calculation: 470 R Axis:   43 Text Interpretation:  Sinus tachycardia Probable inferior infarct, old  Anterolateral Q wave, probably normal for age ED PHYSICIAN INTERPRETATION  AVAILABLE IN [facility] Confirmed by TEST, Record ([id number]) on  10/04/2015 6:34:48 AM      MDM   Final diagnoses:  Asthma exacerbation   Patient presenting with asthma exacerbation. Treated with duoneb in ED with significant improvement in lung sounds and symptoms. CXR suggestive of asthma. Patient ambulated in ED and O2 saturations maintained 100%. There are no current signs of respiratory distress. Prednisone given in the ED and pt will be discharged with 5 day burst, duoneb solution and new prescription for albuterol. Pt reports that she is "a few puffs away" from finishing her current inhaler.  Pt states she is breathing at baseline and she is requesting discharge. Pt has been instructed to follow up with their PCP regarding today's ED visit. Return precautions given in discharge paperwork and discussed with pt at bedside. Pt stable for discharge  Filed Vitals:   10/03/15 2250  BP: 113/78  Pulse: 107  Temp: 98.4 F (36.9 C)  Resp: 20       Crystal Gazzola, PA-C 10/04/15 1403  Laurence Spates, MD 10/06/15 [phone]

## 2015-10-03 NOTE — Discharge Instructions (Signed)
Scheduled follow-up appointment with your primary care doctor for recent increased exacerbations of asthma. Take the prednisone once a day for 5 days. Use her rescue inhaler as needed. Use your breathing treatments "DuoNeb "as needed. Return to the emergency department with difficulty breathing.   Asthma, Adult Asthma is a recurring condition in which the airways tighten and narrow. Asthma can make it difficult to breathe. It can cause coughing, wheezing, and shortness of breath. Asthma episodes, also called asthma attacks, range from minor to life-threatening. Asthma cannot be cured, but medicines and lifestyle changes can help control it. CAUSES Asthma is believed to be caused by inherited (genetic) and environmental factors, but its exact cause is unknown. Asthma may be triggered by allergens, lung infections, or irritants in the air. Asthma triggers are different for each person. Common triggers include:   Animal dander.  Dust mites.  Cockroaches.  Pollen from trees or grass.  Mold.  Smoke.  Air pollutants such as dust, household cleaners, hair sprays, aerosol sprays, paint fumes, strong chemicals, or strong odors.  Cold air, weather changes, and winds (which increase molds and pollens in the air).  Strong emotional expressions such as crying or laughing hard.  Stress.  Certain medicines (such as aspirin) or types of drugs (such as beta-blockers).  Sulfites in foods and drinks. Foods and drinks that may contain sulfites include dried fruit, potato chips, and sparkling grape juice.  Infections or inflammatory conditions such as the flu, a cold, or an inflammation of the nasal membranes (rhinitis).  Gastroesophageal reflux disease (GERD).  Exercise or strenuous activity. SYMPTOMS Symptoms may occur immediately after asthma is triggered or many hours later. Symptoms include:  Wheezing.  Excessive nighttime or early morning coughing.  Frequent or severe coughing with a  common cold.  Chest tightness.  Shortness of breath. DIAGNOSIS  The diagnosis of asthma is made by a review of your medical history and a physical exam. Tests may also be performed. These may include:  Lung function studies. These tests show how much air you breathe in and out.  Allergy tests.  Imaging tests such as X-rays. TREATMENT  Asthma cannot be cured, but it can usually be controlled. Treatment involves identifying and avoiding your asthma triggers. It also involves medicines. There are 2 classes of medicine used for asthma treatment:   Controller medicines. These prevent asthma symptoms from occurring. They are usually taken every day.  Reliever or rescue medicines. These quickly relieve asthma symptoms. They are used as needed and provide short-term relief. Your health care provider will help you create an asthma action plan. An asthma action plan is a written plan for managing and treating your asthma attacks. It includes a list of your asthma triggers and how they may be avoided. It also includes information on when medicines should be taken and when their dosage should be changed. An action plan may also involve the use of a device called a peak flow meter. A peak flow meter measures how well the lungs are working. It helps you monitor your condition. HOME CARE INSTRUCTIONS   Take medicines only as directed by your health care provider. Speak with your health care provider if you have questions about how or when to take the medicines.  Use a peak flow meter as directed by your health care provider. Record and keep track of readings.  Understand and use the action plan to help minimize or stop an asthma attack without needing to seek medical care.  Control your home  environment in the following ways to help prevent asthma attacks:  Do not smoke. Avoid being exposed to secondhand smoke.  Change your heating and air conditioning filter regularly.  Limit your use of  fireplaces and wood stoves.  Get rid of pests (such as roaches and mice) and their droppings.  Throw away plants if you see mold on them.  Clean your floors and dust regularly. Use unscented cleaning products.  Try to have someone else vacuum for you regularly. Stay out of rooms while they are being vacuumed and for a short while afterward. If you vacuum, use a dust mask from a hardware store, a double-layered or microfilter vacuum cleaner bag, or a vacuum cleaner with a HEPA filter.  Replace carpet with wood, tile, or vinyl flooring. Carpet can trap dander and dust.  Use allergy-proof pillows, mattress covers, and box spring covers.  Wash bed sheets and blankets every week in hot water and dry them in a dryer.  Use blankets that are made of polyester or cotton.  Clean bathrooms and kitchens with bleach. If possible, have someone repaint the walls in these rooms with mold-resistant paint. Keep out of the rooms that are being cleaned and painted.  Wash hands frequently. SEEK MEDICAL CARE IF:   You have wheezing, shortness of breath, or a cough even if taking medicine to prevent attacks.  The colored mucus you cough up (sputum) is thicker than usual.  Your sputum changes from clear or white to yellow, green, gray, or bloody.  You have any problems that may be related to the medicines you are taking (such as a rash, itching, swelling, or trouble breathing).  You are using a reliever medicine more than 2-3 times per week.  Your peak flow is still at 50-79% of your personal best after following your action plan for 1 hour.  You have a fever. SEEK IMMEDIATE MEDICAL CARE IF:   You seem to be getting worse and are unresponsive to treatment during an asthma attack.  You are short of breath even at rest.  You get short of breath when doing very little physical activity.  You have difficulty eating, drinking, or talking due to asthma symptoms.  You develop chest pain.  You  develop a fast heartbeat.  You have a bluish color to your lips or fingernails.  You are light-headed, dizzy, or faint.  Your peak flow is less than 50% of your personal best.   This information is not intended to replace advice given to you by your health care provider. Make sure you discuss any questions you have with your health care provider.   Document Released: 11/11/2005 Document Revised: 08/02/2015 Document Reviewed: 06/10/2013 Elsevier Interactive Patient Education 2016 [employer]  Asthma Attack Prevention While you may not be able to control the fact that you have asthma, you can take actions to prevent asthma attacks. The best way to prevent asthma attacks is to maintain good control of your asthma. You can achieve this by:  Taking your medicines as directed.  Avoiding things that can irritate your airways or make your asthma symptoms worse (asthma triggers).  Keeping track of how well your asthma is controlled and of any changes in your symptoms.  Responding quickly to worsening asthma symptoms (asthma attack).  Seeking emergency care when it is needed. WHAT ARE SOME WAYS TO PREVENT AN ASTHMA ATTACK? Have a Plan Work with your health care provider to create a written plan for managing and treating your asthma attacks (asthma  action plan). This plan includes:  A list of your asthma triggers and how you can avoid them.  Information on when medicines should be taken and when their dosages should be changed.  The use of a device that measures how well your lungs are working (peak flow meter). Monitor Your Asthma Use your peak flow meter and record your results in a journal every day. A drop in your peak flow numbers on one or more days may indicate the start of an asthma attack. This can happen even before you start to feel symptoms. You can prevent an asthma attack from getting worse by following the steps in your asthma action plan. Avoid Asthma Triggers Work with  your asthma health care provider to find out what your asthma triggers are. This can be done by:  Allergy testing.  Keeping a journal that notes when asthma attacks occur and the factors that may have contributed to them.  Determining if there are other medical conditions that are making your asthma worse. Once you have determined your asthma triggers, take steps to avoid them. This may include avoiding excessive or prolonged exposure to:  Dust. Have someone dust and vacuum your home for you once or twice a week. Using a high-efficiency particulate arrestance (HEPA) vacuum is best.  Smoke. This includes campfire smoke, forest fire smoke, and secondhand smoke from tobacco products.  Pet dander. Avoid contact with animals that you know you are allergic to.  Allergens from trees, grasses or pollens. Avoid spending a lot of time outdoors when pollen counts are high, and on very windy days.  Very cold, dry, or humid air.  Mold.  Foods that contain high amounts of sulfites.  Strong odors.  Outdoor air pollutants, such as Museum/gallery exhibitions officerengine exhaust.  Indoor air pollutants, such as aerosol sprays and fumes from household cleaners.  Household pests, including dust mites and cockroaches, and pest droppings.  Certain medicines, including NSAIDs. Always talk to your health care provider before stopping or starting any new medicines. Medicines Take over-the-counter and prescription medicines only as told by your health care provider. Many asthma attacks can be prevented by carefully following your medicine schedule. Taking your medicines correctly is especially important when you cannot avoid certain asthma triggers. Act Quickly If an asthma attack does happen, acting quickly can decrease how severe it is and how long it lasts. Take these steps:   Pay attention to your symptoms. If you are coughing, wheezing, or having difficulty breathing, do not wait to see if your symptoms go away on their own. Follow  your asthma action plan.  If you have followed your asthma action plan and your symptoms are not improving, call your health care provider or seek immediate medical care at the nearest hospital. It is important to note how often you need to use your fast-acting rescue inhaler. If you are using your rescue inhaler more often, it may mean that your asthma is not under control. Adjusting your asthma treatment plan may help you to prevent future asthma attacks and help you to gain better control of your condition. HOW CAN I PREVENT AN ASTHMA ATTACK WHEN I EXERCISE? Follow advice from your health care provider about whether you should use your fast-acting inhaler before exercising. Many people with asthma experience exercise-induced bronchoconstriction (EIB). This condition often worsens during vigorous exercise in cold, humid, or dry environments. Usually, people with EIB can stay very active by pre-treating with a fast-acting inhaler before exercising.   This information is not  intended to replace advice given to you by your health care provider. Make sure you discuss any questions you have with your health care provider.   Document Released: 10/30/2009 Document Revised: 08/02/2015 Document Reviewed: 04/13/2015 Elsevier Interactive Patient Education [employer]

## 2015-10-03 NOTE — Telephone Encounter (Signed)
Left message for a return call

## 2015-10-03 NOTE — Telephone Encounter (Signed)
Patient aware of results and will keep her follow up appt. 

## 2015-11-06 ENCOUNTER — Telehealth: Payer: Self-pay | Admitting: *Deleted

## 2015-11-06 ENCOUNTER — Ambulatory Visit: Payer: Medicaid Other | Admitting: Neurology

## 2015-11-06 NOTE — Telephone Encounter (Signed)
Patient called to cancel the morning of her appt.

## 2015-12-15 ENCOUNTER — Encounter (HOSPITAL_COMMUNITY): Payer: Self-pay | Admitting: Emergency Medicine

## 2015-12-15 ENCOUNTER — Emergency Department (HOSPITAL_COMMUNITY)
Admission: EM | Admit: 2015-12-15 | Discharge: 2015-12-16 | Disposition: A | Discharge status: Home / Self Care | Payer: Medicaid Other | Attending: Emergency Medicine | Admitting: Emergency Medicine

## 2015-12-15 DIAGNOSIS — Z8619 Personal history of other infectious and parasitic diseases: Secondary | ICD-10-CM | POA: Diagnosis not present

## 2015-12-15 DIAGNOSIS — Z79899 Other long term (current) drug therapy: Secondary | ICD-10-CM | POA: Diagnosis not present

## 2015-12-15 DIAGNOSIS — F419 Anxiety disorder, unspecified: Secondary | ICD-10-CM | POA: Insufficient documentation

## 2015-12-15 DIAGNOSIS — M549 Dorsalgia, unspecified: Secondary | ICD-10-CM | POA: Diagnosis not present

## 2015-12-15 DIAGNOSIS — Z7951 Long term (current) use of inhaled steroids: Secondary | ICD-10-CM | POA: Insufficient documentation

## 2015-12-15 DIAGNOSIS — D509 Iron deficiency anemia, unspecified: Secondary | ICD-10-CM | POA: Diagnosis not present

## 2015-12-15 DIAGNOSIS — Z880 Allergy status to penicillin: Secondary | ICD-10-CM | POA: Diagnosis not present

## 2015-12-15 DIAGNOSIS — Z9851 Tubal ligation status: Secondary | ICD-10-CM | POA: Diagnosis not present

## 2015-12-15 DIAGNOSIS — Z79818 Long term (current) use of other agents affecting estrogen receptors and estrogen levels: Secondary | ICD-10-CM | POA: Insufficient documentation

## 2015-12-15 DIAGNOSIS — R109 Unspecified abdominal pain: Secondary | ICD-10-CM

## 2015-12-15 DIAGNOSIS — R1032 Left lower quadrant pain: Secondary | ICD-10-CM | POA: Insufficient documentation

## 2015-12-15 DIAGNOSIS — Z3202 Encounter for pregnancy test, result negative: Secondary | ICD-10-CM | POA: Insufficient documentation

## 2015-12-15 DIAGNOSIS — Z9049 Acquired absence of other specified parts of digestive tract: Secondary | ICD-10-CM | POA: Insufficient documentation

## 2015-12-15 DIAGNOSIS — F329 Major depressive disorder, single episode, unspecified: Secondary | ICD-10-CM | POA: Diagnosis not present

## 2015-12-15 LAB — CBC
HCT: 36.9 % (ref 36.0–46.0)
Hemoglobin: 11.6 g/dL — ABNORMAL LOW (ref 12.0–15.0)
MCH: 19.3 pg — AB (ref 26.0–34.0)
MCHC: 31.4 g/dL (ref 30.0–36.0)
MCV: 61.3 fL — AB (ref 78.0–100.0)
PLATELETS: 263 10*3/uL (ref 150–400)
RBC: 6.02 MIL/uL — ABNORMAL HIGH (ref 3.87–5.11)
RDW: 15.3 % (ref 11.5–15.5)
WBC: 8.2 10*3/uL (ref 4.0–10.5)

## 2015-12-15 LAB — URINALYSIS, ROUTINE W REFLEX MICROSCOPIC
BILIRUBIN URINE: NEGATIVE
GLUCOSE, UA: NEGATIVE mg/dL
HGB URINE DIPSTICK: NEGATIVE
KETONES UR: NEGATIVE mg/dL
Leukocytes, UA: NEGATIVE
Nitrite: NEGATIVE
PROTEIN: NEGATIVE mg/dL
Specific Gravity, Urine: 1.031 — ABNORMAL HIGH (ref 1.005–1.030)
pH: 5.5 (ref 5.0–8.0)

## 2015-12-15 LAB — I-STAT BETA HCG BLOOD, ED (MC, WL, AP ONLY)

## 2015-12-15 NOTE — ED Notes (Signed)
Patient complaining of abdominal and back pain for two days. Patient says it hurts so bad that can not eat or sleep. Patient states that she all of sudden got pain.

## 2015-12-16 ENCOUNTER — Emergency Department (HOSPITAL_COMMUNITY): Payer: Medicaid Other

## 2015-12-16 LAB — COMPREHENSIVE METABOLIC PANEL
ALK PHOS: 57 U/L (ref 38–126)
ALT: 16 U/L (ref 14–54)
ANION GAP: 9 (ref 5–15)
AST: 18 U/L (ref 15–41)
Albumin: 4.2 g/dL (ref 3.5–5.0)
BILIRUBIN TOTAL: 0.8 mg/dL (ref 0.3–1.2)
BUN: 13 mg/dL (ref 6–20)
CALCIUM: 8.9 mg/dL (ref 8.9–10.3)
CO2: 23 mmol/L (ref 22–32)
Chloride: 107 mmol/L (ref 101–111)
Creatinine, Ser: 0.73 mg/dL (ref 0.44–1.00)
GLUCOSE: 97 mg/dL (ref 65–99)
POTASSIUM: 3.9 mmol/L (ref 3.5–5.1)
Sodium: 139 mmol/L (ref 135–145)
TOTAL PROTEIN: 7.9 g/dL (ref 6.5–8.1)

## 2015-12-16 LAB — LIPASE, BLOOD: Lipase: 32 U/L (ref 11–51)

## 2015-12-16 MED ORDER — HYDROCODONE-ACETAMINOPHEN 5-325 MG PO TABS: 1.0000 | ORAL_TABLET | ORAL | Status: DC | PRN

## 2015-12-16 MED ORDER — IOHEXOL 300 MG/ML  SOLN
50.0000 mL | Freq: Once | INTRAMUSCULAR | Status: AC | PRN
  Administered 2015-12-16: 50 mL via ORAL

## 2015-12-16 MED ORDER — CIPROFLOXACIN HCL 500 MG PO TABS: 500.0000 mg | ORAL_TABLET | Freq: Two times a day (BID) | ORAL | Status: DC

## 2015-12-16 MED ORDER — SODIUM CHLORIDE 0.9 % IV SOLN
1000.0000 mL | INTRAVENOUS | Status: DC
  Administered 2015-12-16: 1000 mL via INTRAVENOUS

## 2015-12-16 MED ORDER — ONDANSETRON HCL 4 MG/2ML IJ SOLN
4.0000 mg | Freq: Once | INTRAMUSCULAR | Status: AC
  Administered 2015-12-16: 4 mg via INTRAVENOUS
  Filled 2015-12-16: qty 2

## 2015-12-16 MED ORDER — SODIUM CHLORIDE 0.9 % IV SOLN
1000.0000 mL | Freq: Once | INTRAVENOUS | Status: AC
  Administered 2015-12-16: 1000 mL via INTRAVENOUS

## 2015-12-16 MED ORDER — ONDANSETRON HCL 4 MG PO TABS: 4.0000 mg | ORAL_TABLET | Freq: Four times a day (QID) | ORAL | Status: DC

## 2015-12-16 MED ORDER — MORPHINE SULFATE (PF) 4 MG/ML IV SOLN
4.0000 mg | INTRAVENOUS | Status: DC | PRN
  Administered 2015-12-16: 4 mg via INTRAVENOUS
  Filled 2015-12-16: qty 1

## 2015-12-16 MED ORDER — CIPROFLOXACIN HCL 500 MG PO TABS
500.0000 mg | ORAL_TABLET | Freq: Once | ORAL | Status: AC
  Administered 2015-12-16: 500 mg via ORAL
  Filled 2015-12-16: qty 1

## 2015-12-16 MED ORDER — MORPHINE SULFATE (PF) 4 MG/ML IV SOLN
4.0000 mg | Freq: Once | INTRAVENOUS | Status: AC
  Administered 2015-12-16: 4 mg via INTRAVENOUS
  Filled 2015-12-16: qty 1

## 2015-12-16 MED ORDER — METRONIDAZOLE 500 MG PO TABS: 500.0000 mg | ORAL_TABLET | Freq: Three times a day (TID) | ORAL | Status: DC

## 2015-12-16 MED ORDER — IOHEXOL 300 MG/ML  SOLN
100.0000 mL | Freq: Once | INTRAMUSCULAR | Status: AC | PRN
  Administered 2015-12-16: 100 mL via INTRAVENOUS

## 2015-12-16 MED ORDER — METRONIDAZOLE 500 MG PO TABS
500.0000 mg | ORAL_TABLET | Freq: Once | ORAL | Status: AC
  Administered 2015-12-16: 500 mg via ORAL
  Filled 2015-12-16: qty 1

## 2015-12-16 NOTE — ED Provider Notes (Signed)
CSN: [id number]     Arrival date & time 12/15/15  2223 History  By signing my name below, I, Phillis Haggis, attest that this documentation has been prepared under the direction and in the presence of Dione Booze, MD. Electronically Signed: Phillis Haggis, ED Scribe. 12/16/2015. 1:00 AM.   Chief Complaint  Patient presents with  . Abdominal Pain  . Back Pain   The history is provided by the patient. No language interpreter was used.  HPI Comments: Crystal Myers is a 35 y.o. Female with a hx of frequent UTIs and cholescystectomy who presents to the Emergency Department complaining of sudden onset, gradually worsening, constant, sharp, shooting LLQ abdominal pain that radiates to her left lower back onset 2 days ago. She states that the pain is currently 9/10 and has become generalized over her entire back. She reports associated nausea. Pt has been taking Tylenol to no relief. She denies that this pain is typical of her UTI pain. She denies worsening or alleviating factors. She denies fever, chills, vomiting, diarrhea, constipation, dysuria, frequency or urgency. Pt is due to see a urologist and gynecologist, as directed by her PCP. Pt is allergic to Penicillin, Percocet and Chantix.    Past Medical History  Diagnosis Date  . Anemia   . Chlamydia   . Headache   . Depression   . Anxiety    Past Surgical History  Procedure Laterality Date  . Cesarean section    . Appendectomy    . Tonsillectomy    . Cesarean section      x4  . Tubal ligation    . Cholecystectomy N/A 12/24/2014    Procedure: LAPAROSCOPIC CHOLECYSTECTOMY WITH INTRAOPERATIVE CHOLANGIOGRAM;  Surgeon: Glenna Fellows, MD;  Location: [facility];  Service: General;  Laterality: N/A;  . Adenoidectomy    . Lymphadenectomy     Family History  Problem Relation Age of Onset  . Diabetes Mother   . Hypertension Mother   . CAD Mother    Social History  Substance Use Topics  . Smoking status: Former Smoker -- 0.50 packs/day     Types: Cigarettes    Quit date: 11/25/2014  . Smokeless tobacco: Never Used  . Alcohol Use: No     Comment: former   OB History    Gravida Para Term Preterm AB TAB SAB Ectopic Multiple Living   5 5 3 2      3      Review of Systems  Constitutional: Negative for fever and chills.  Gastrointestinal: Positive for nausea and abdominal pain. Negative for vomiting, diarrhea and constipation.  Genitourinary: Negative for dysuria, urgency and frequency.  Musculoskeletal: Positive for back pain.  All other systems reviewed and are negative.  Allergies  Chantix; Percocet; and Penicillins  Home Medications   Prior to Admission medications   Medication Sig Start Date End Date Taking? Authorizing Provider  albuterol (PROVENTIL HFA;VENTOLIN HFA) 108 (90 BASE) MCG/ACT inhaler Inhale 2 puffs into the lungs every 4 (four) hours as needed for wheezing or shortness of breath. 07/25/14  Yes Shon Baton, MD  busPIRone (BUSPAR) 5 MG tablet Take 5 mg by mouth daily.   Yes Historical Provider, MD  cetirizine (ZYRTEC) 10 MG tablet Take 10 mg by mouth daily as needed for allergies.  03/24/15  Yes Historical Provider, MD  famotidine (PEPCID) 20 MG tablet Take 1 tablet (20 mg total) by mouth 2 (two) times daily. 12/14/14  Yes Sachi Palumbo, MD  FLUoxetine (PROZAC) 20 MG capsule Take 40  mg by mouth at bedtime.    Yes Historical Provider, MD  Fluticasone-Salmeterol (ADVAIR) 250-50 MCG/DOSE AEPB Inhale 1 puff into the lungs 2 (two) times daily.   Yes Historical Provider, MD  ipratropium-albuterol (DUONEB) 0.5-2.5 (3) MG/3ML SOLN Take 3 mLs by nebulization every 2 (two) hours as needed. 10/03/15  Yes Stevi Barrett, PA-C  Levonorgestrel-Ethinyl Estradiol (SEASONIQUE) 0.15-0.03 &0.01 MG tablet Take 1 tablet by mouth daily.     Yes Historical Provider, MD  montelukast (SINGULAIR) 10 MG tablet Take 10 mg by mouth at bedtime.   Yes Historical Provider, MD  Olopatadine HCl (PATADAY) 0.2 % SOLN Place 2 drops into both  eyes as needed (allergies).  08/19/14  Yes Historical Provider, MD  omeprazole (PRILOSEC) 40 MG capsule Take 40 mg by mouth daily. 08/19/14  Yes Historical Provider, MD  PATANASE 0.6 % SOLN Place 2 sprays into the nose 2 (two) times daily. 03/27/15  Yes Historical Provider, MD  rizatriptan (MAXALT-MLT) 5 MG disintegrating tablet Take 1 tablet (5 mg total) by mouth as needed. May repeat in 2 hours if needed 09/19/15  Yes Levert Feinstein, MD  rOPINIRole (REQUIP) 1 MG tablet Take 1 mg by mouth at bedtime.   Yes Historical Provider, MD  topiramate (TOPAMAX) 100 MG tablet 1/2 tab po bid xone week then one po bid Patient taking differently: Take 100 mg by mouth daily.  09/19/15  Yes Levert Feinstein, MD  traZODone (DESYREL) 100 MG tablet Take 50-100 mg by mouth at bedtime. 04/04/15  Yes Historical Provider, MD  cephALEXin (KEFLEX) 500 MG capsule Take 1 capsule (500 mg total) by mouth 3 (three) times daily. Patient not taking: Reported on 10/03/2015 03/16/15   Garlon Hatchet, PA-C  EPINEPHrine (EPIPEN 2-PAK) 0.3 mg/0.3 mL IJ SOAJ injection Inject 1 Dose as directed as needed. 08/19/14   Historical Provider, MD  loratadine (CLARITIN) 10 MG tablet Take 1 tablet (10 mg total) by mouth daily. Patient not taking: Reported on 10/03/2015 07/25/14   Shon Baton, MD  predniSONE (DELTASONE) 50 MG tablet Take one tablet a day for 5 days Patient not taking: Reported on 12/15/2015 10/03/15   Stevi Barrett, PA-C   BP 132/105 mmHg  Pulse 85  Temp(Src) 97.9 F (36.6 C) (Oral)  Resp 18  Ht  (1.778 m)  Wt 271 lb (122.925 kg)  BMI 38.88 kg/m2  SpO2 98% Physical Exam  Constitutional: She is oriented to person, place, and time. She appears well-developed and well-nourished.  HENT:  Head: Normocephalic and atraumatic.  Eyes: EOM are normal. Pupils are equal, round, and reactive to light.  Neck: Normal range of motion. Neck supple. No JVD present.  Cardiovascular: Normal rate, regular rhythm and normal heart sounds.  Exam  reveals no gallop and no friction rub.   No murmur heard. Pulmonary/Chest: Effort normal and breath sounds normal. She has no wheezes. She has no rales. She exhibits no tenderness.  Abdominal: Soft. She exhibits no mass. Bowel sounds are decreased. There is tenderness in the left lower quadrant. There is no rebound and no guarding.  Moderate tenderness to LLQ and costovertebral angle; bowel sounds slightly decreased  Musculoskeletal: Normal range of motion. She exhibits no edema.  Lymphadenopathy:    She has no cervical adenopathy.  Neurological: She is alert and oriented to person, place, and time. No cranial nerve deficit. She exhibits normal muscle tone. Coordination normal.  Skin: Skin is warm and dry. No rash noted.  Psychiatric: She has a normal mood and affect. Her  behavior is normal. Judgment and thought content normal.  Nursing note and vitals reviewed.   ED Course  Procedures (including critical care time) DIAGNOSTIC STUDIES: Oxygen Saturation is 98% on RA, normal by my interpretation.    COORDINATION OF CARE: 12:59 AM-Discussed treatment plan which includes IV and labs with pt at bedside and pt agreed to plan.    Labs Review Results for orders placed or performed during the hospital encounter of 12/15/15  Lipase, blood  Result Value Ref Range   Lipase 32 11 - 51 U/L  Comprehensive metabolic panel  Result Value Ref Range   Sodium 139 135 - 145 mmol/L   Potassium 3.9 3.5 - 5.1 mmol/L   Chloride 107 101 - 111 mmol/L   CO2 23 22 - 32 mmol/L   Glucose, Bld 97 65 - 99 mg/dL   BUN 13 6 - 20 mg/dL   Creatinine, Ser 4.09 0.44 - 1.00 mg/dL   Calcium 8.9 8.9 - 81.1 mg/dL   Total Protein 7.9 6.5 - 8.1 g/dL   Albumin 4.2 3.5 - 5.0 g/dL   AST 18 15 - 41 U/L   ALT 16 14 - 54 U/L   Alkaline Phosphatase 57 38 - 126 U/L   Total Bilirubin 0.8 0.3 - 1.2 mg/dL   GFR calc non Af Amer >60 >60 mL/min   GFR calc Af Amer >60 >60 mL/min   Anion gap 9 5 - 15  CBC  Result Value Ref  Range   WBC 8.2 4.0 - 10.5 K/uL   RBC 6.02 (H) 3.87 - 5.11 MIL/uL   Hemoglobin 11.6 (L) 12.0 - 15.0 g/dL   HCT 91.4 78.2 - 95.6 %   MCV 61.3 (L) 78.0 - 100.0 fL   MCH 19.3 (L) 26.0 - 34.0 pg   MCHC 31.4 30.0 - 36.0 g/dL   RDW 21.3 08.6 - 57.8 %   Platelets 263 150 - 400 K/uL  Urinalysis, Routine w reflex microscopic (not at [facility])  Result Value Ref Range   Color, Urine YELLOW YELLOW   APPearance CLOUDY (A) CLEAR   Specific Gravity, Urine 1.031 (H) 1.005 - 1.030   pH 5.5 5.0 - 8.0   Glucose, UA NEGATIVE NEGATIVE mg/dL   Hgb urine dipstick NEGATIVE NEGATIVE   Bilirubin Urine NEGATIVE NEGATIVE   Ketones, ur NEGATIVE NEGATIVE mg/dL   Protein, ur NEGATIVE NEGATIVE mg/dL   Nitrite NEGATIVE NEGATIVE   Leukocytes, UA NEGATIVE NEGATIVE  I-Stat beta hCG blood, ED ([facility], [facility], [facility] only)  Result Value Ref Range   I-stat hCG, quantitative <5.0 <5 mIU/mL   Comment 3           Imaging Review Ct Abdomen Pelvis W Contrast  12/16/2015  CLINICAL DATA:  35 year old female with sudden onset left lower quadrant abdominal pain. EXAM: CT ABDOMEN AND PELVIS WITH CONTRAST TECHNIQUE: Multidetector CT imaging of the abdomen and pelvis was performed using the standard protocol following bolus administration of intravenous contrast. CONTRAST:  50mL OMNIPAQUE IOHEXOL 300 MG/ML SOLN, OMNIPAQUE IOHEXOL 300 MG/ML SOLN COMPARISON:  None. FINDINGS: The visualized lung bases are clear. No intra-abdominal free air or free fluid. Cholecystectomy. The liver, pancreas, spleen, adrenal glands appear unremarkable. There is minimal fullness of the right renal collecting system. A tiny nonobstructing right renal inferior pole calculus may be present. Correlation with urinalysis recommended to exclude side. The left kidney appears unremarkable. The visualized ureters and urinary bladder appear unremarkable. The uterus and the ovaries are grossly unremarkable. There is no evidence of  bowel obstruction or inflammation. The appendix  is not visualized with certainty. No inflammatory changes identified in the right lower quadrant. The abdominal aorta and IVC appear unremarkable. No portal venous gas identified. There is no adenopathy. The abdominal wall soft tissues appear unremarkable. Small fat containing umbilical hernia. The osseous structures are intact. IMPRESSION: Minimal fullness of the right renal collecting system. Correlation with urinalysis recommended to exclude UTI. No other acute intra-abdominal or pelvic pathology identified. Electronically Signed   By: Elgie Collard M.D.   On: 12/16/2015 04:30   I have personally reviewed and evaluated these images and lab results as part of my medical decision-making.    MDM   Final diagnoses:  Abdominal pain, unspecified abdominal location  Microcytic anemia    Left lower quadrant and left flank pain suspicious for urinary tract infection or renal colic. Old records were reviewed and one year ago she had a CT of the abdomen and pelvis which showed no evidence of nephrolithiasis. Urinalysis does not show evidence of infection. She is sent for CT of abdomen and pelvis which shows no evidence of urolithiasis no other evidence of diverticulitis. Finding of some fullness of the right renal collecting system appears to be an incidental finding since her symptoms are left-sided. No clear cause for pain is identified. Laboratory workup shows microcytic anemia which is unchanged from baseline. Decision is made to treat with a course of antibiotics for possible diverticulitis even though it is not seen on CT scan. She is discharged with prescriptions for ciprofloxacin and metronidazole. Also given prescriptions for hydrocodone-acetaminophen for pain and ondansetron for nausea. Follow-up with PCP in 3 days to assess initial response. Return precautions are given.  I personally performed the services described in this documentation, which was scribed in my presence. The recorded  information has been reviewed and is accurate.      Dione Booze, MD 12/16/15 [phone]

## 2015-12-16 NOTE — Discharge Instructions (Signed)
Your evaluation to not show a clear cause for your pain. You're being treated for possible diverticulitis, even though it was not seen on the CT scan. Please take the medication as prescribed. Return if pain is getting worse, you start running a fever, or you of vomiting and the vomiting is not being controlled with the medication. See your primary care provider in 3 days to evaluate your initial progress.  Abdominal Pain, Adult Many things can cause abdominal pain. Usually, abdominal pain is not caused by a disease and will improve without treatment. It can often be observed and treated at home. Your health care provider will do a physical exam and possibly order blood tests and X-rays to help determine the seriousness of your pain. However, in many cases, more time must pass before a clear cause of the pain can be found. Before that point, your health care provider may not know if you need more testing or further treatment. HOME CARE INSTRUCTIONS Monitor your abdominal pain for any changes. The following actions may help to alleviate any discomfort you are experiencing:  Only take over-the-counter or prescription medicines as directed by your health care provider.  Do not take laxatives unless directed to do so by your health care provider.  Try a clear liquid diet (broth, tea, or water) as directed by your health care provider. Slowly move to a bland diet as tolerated. SEEK MEDICAL CARE IF:  You have unexplained abdominal pain.  You have abdominal pain associated with nausea or diarrhea.  You have pain when you urinate or have a bowel movement.  You experience abdominal pain that wakes you in the night.  You have abdominal pain that is worsened or improved by eating food.  You have abdominal pain that is worsened with eating fatty foods.  You have a fever. SEEK IMMEDIATE MEDICAL CARE IF:  Your pain does not go away within 2 hours.  You keep throwing up (vomiting).  Your pain is  felt only in portions of the abdomen, such as the right side or the left lower portion of the abdomen.  You pass bloody or black tarry stools. MAKE SURE YOU:  Understand these instructions.  Will watch your condition.  Will get help right away if you are not doing well or get worse.   This information is not intended to replace advice given to you by your health care provider. Make sure you discuss any questions you have with your health care provider.   Document Released: 08/21/2005 Document Revised: 08/02/2015 Document Reviewed: 07/21/2013 Elsevier Interactive Patient Education 2016 [employer]  Ciprofloxacin tablets What is this medicine? CIPROFLOXACIN (sip roe FLOX a sin) is a quinolone antibiotic. It is used to treat certain kinds of bacterial infections. It will not work for colds, flu, or other viral infections. This medicine may be used for other purposes; ask your health care provider or pharmacist if you have questions. What should I tell my health care provider before I take this medicine? They need to know if you have any of these conditions: -bone problems -cerebral disease -history of low levels of potassium in the blood -joint problems -irregular heartbeat -kidney disease -myasthenia gravis -seizures -tendon problems -tingling of the fingers or toes, or other nerve disorder -an unusual or allergic reaction to ciprofloxacin, other antibiotics or medicines, foods, dyes, or preservatives -pregnant or trying to get pregnant -breast-feeding How should I use this medicine? Take this medicine by mouth with a glass of water. Follow the directions on  the prescription label. Take your medicine at regular intervals. Do not take your medicine more often than directed. Take all of your medicine as directed even if you think your are better. Do not skip doses or stop your medicine early. You can take this medicine with food or on an empty stomach. It can be taken with a meal  that contains dairy or calcium, but do not take it alone with a dairy product, like milk or yogurt or calcium-fortified juice. A special MedGuide will be given to you by the pharmacist with each prescription and refill. Be sure to read this information carefully each time. Talk to your pediatrician regarding the use of this medicine in children. Special care may be needed. Overdosage: If you think you have taken too much of this medicine contact a poison control center or emergency room at once. NOTE: This medicine is only for you. Do not share this medicine with others. What if I miss a dose? If you miss a dose, take it as soon as you can. If it is almost time for your next dose, take only that dose. Do not take double or extra doses. What may interact with this medicine? Do not take this medicine with any of the following medications: -cisapride -droperidol -terfenadine -tizanidine This medicine may also interact with the following medications: -antacids -birth control pills -caffeine -cyclosporin -didanosine (ddI) buffered tablets or powder -medicines for diabetes -medicines for inflammation like ibuprofen, naproxen -methotrexate -multivitamins -omeprazole -phenytoin -probenecid -sucralfate -theophylline -warfarin This list may not describe all possible interactions. Give your health care provider a list of all the medicines, herbs, non-prescription drugs, or dietary supplements you use. Also tell them if you smoke, drink alcohol, or use illegal drugs. Some items may interact with your medicine. What should I watch for while using this medicine? Tell your doctor or health care professional if your symptoms do not improve. Do not treat diarrhea with over the counter products. Contact your doctor if you have diarrhea that lasts more than 2 days or if it is severe and watery. You may get drowsy or dizzy. Do not drive, use machinery, or do anything that needs mental alertness until  you know how this medicine affects you. Do not stand or sit up quickly, especially if you are an older patient. This reduces the risk of dizzy or fainting spells. This medicine can make you more sensitive to the sun. Keep out of the sun. If you cannot avoid being in the sun, wear protective clothing and use sunscreen. Do not use sun lamps or tanning beds/booths. Avoid antacids, aluminum, calcium, iron, magnesium, and zinc products for 6 hours before and 2 hours after taking a dose of this medicine. What side effects may I notice from receiving this medicine? Side effects that you should report to your doctor or health care professional as soon as possible: -allergic reactions like skin rash or hives, swelling of the face, lips, or tongue -anxious -confusion -depressed mood -diarrhea -fast, irregular heartbeat -hallucination, loss of contact with reality -joint, muscle, or tendon pain or swelling -pain, tingling, numbness in the hands or feet -suicidal thoughts or other mood changes -sunburn -unusually weak or tired Side effects that usually do not require medical attention (report to your doctor or health care professional if they continue or are bothersome): -dry mouth -headache -nausea -trouble sleeping This list may not describe all possible side effects. Call your doctor for medical advice about side effects. You may report side effects to FDA  at 1-800-FDA-1088. Where should I keep my medicine? Keep out of the reach of children. Store at room temperature below 30 degrees C (86 degrees F). Keep container tightly closed. Throw away any unused medicine after the expiration date. NOTE: This sheet is a summary. It may not cover all possible information. If you have questions about this medicine, talk to your doctor, pharmacist, or health care provider.    2016, Elsevier/Gold Standard. (2015-06-22 12:57:02)  Metronidazole tablets or capsules What is this medicine? METRONIDAZOLE (me  troe NI da zole) is an antiinfective. It is used to treat certain kinds of bacterial and protozoal infections. It will not work for colds, flu, or other viral infections. This medicine may be used for other purposes; ask your health care provider or pharmacist if you have questions. What should I tell my health care provider before I take this medicine? They need to know if you have any of these conditions: -anemia or other blood disorders -disease of the nervous system -fungal or yeast infection -if you drink alcohol containing drinks -liver disease -seizures -an unusual or allergic reaction to metronidazole, or other medicines, foods, dyes, or preservatives -pregnant or trying to get pregnant -breast-feeding How should I use this medicine? Take this medicine by mouth with a full glass of water. Follow the directions on the prescription label. Take your medicine at regular intervals. Do not take your medicine more often than directed. Take all of your medicine as directed even if you think you are better. Do not skip doses or stop your medicine early. Talk to your pediatrician regarding the use of this medicine in children. Special care may be needed. Overdosage: If you think you have taken too much of this medicine contact a poison control center or emergency room at once. NOTE: This medicine is only for you. Do not share this medicine with others. What if I miss a dose? If you miss a dose, take it as soon as you can. If it is almost time for your next dose, take only that dose. Do not take double or extra doses. What may interact with this medicine? Do not take this medicine with any of the following medications: -alcohol or any product that contains alcohol -amprenavir oral solution -cisapride -disulfiram -dofetilide -dronedarone -paclitaxel injection -pimozide -ritonavir oral solution -sertraline oral solution -sulfamethoxazole-trimethoprim  injection -thioridazine -ziprasidone This medicine may also interact with the following medications: -birth control pills -cimetidine -lithium -other medicines that prolong the QT interval (cause an abnormal heart rhythm) -phenobarbital -phenytoin -warfarin This list may not describe all possible interactions. Give your health care provider a list of all the medicines, herbs, non-prescription drugs, or dietary supplements you use. Also tell them if you smoke, drink alcohol, or use illegal drugs. Some items may interact with your medicine. What should I watch for while using this medicine? Tell your doctor or health care professional if your symptoms do not improve or if they get worse. You may get drowsy or dizzy. Do not drive, use machinery, or do anything that needs mental alertness until you know how this medicine affects you. Do not stand or sit up quickly, especially if you are an older patient. This reduces the risk of dizzy or fainting spells. Avoid alcoholic drinks while you are taking this medicine and for three days afterward. Alcohol may make you feel dizzy, sick, or flushed. If you are being treated for a sexually transmitted disease, avoid sexual contact until you have finished your treatment. Your sexual partner may  also need treatment. What side effects may I notice from receiving this medicine? Side effects that you should report to your doctor or health care professional as soon as possible: -allergic reactions like skin rash or hives, swelling of the face, lips, or tongue -confusion, clumsiness -difficulty speaking -discolored or sore mouth -dizziness -fever, infection -numbness, tingling, pain or weakness in the hands or feet -trouble passing urine or change in the amount of urine -redness, blistering, peeling or loosening of the skin, including inside the mouth -seizures -unusually weak or tired -vaginal irritation, dryness, or discharge Side effects that usually do  not require medical attention (report to your doctor or health care professional if they continue or are bothersome): -diarrhea -headache -irritability -metallic taste -nausea -stomach pain or cramps -trouble sleeping This list may not describe all possible side effects. Call your doctor for medical advice about side effects. You may report side effects to FDA at 1-800-FDA-1088. Where should I keep my medicine? Keep out of the reach of children. Store at room temperature below 25 degrees C (77 degrees F). Protect from light. Keep container tightly closed. Throw away any unused medicine after the expiration date. NOTE: This sheet is a summary. It may not cover all possible information. If you have questions about this medicine, talk to your doctor, pharmacist, or health care provider.    2016, Elsevier/Gold Standard. (2013-06-18 14:08:39)  Acetaminophen; Hydrocodone tablets or capsules What is this medicine? ACETAMINOPHEN; HYDROCODONE (a set a MEE noe fen; hye droe KOE done) is a pain reliever. It is used to treat moderate to severe pain. This medicine may be used for other purposes; ask your health care provider or pharmacist if you have questions. What should I tell my health care provider before I take this medicine? They need to know if you have any of these conditions: -brain tumor -Crohn's disease, inflammatory bowel disease, or ulcerative colitis -drug abuse or addiction -head injury -heart or circulation problems -if you often drink alcohol -kidney disease or problems going to the bathroom -liver disease -lung disease, asthma, or breathing problems -an unusual or allergic reaction to acetaminophen, hydrocodone, other opioid analgesics, other medicines, foods, dyes, or preservatives -pregnant or trying to get pregnant -breast-feeding How should I use this medicine? Take this medicine by mouth. Swallow it with a full glass of water. Follow the directions on the prescription  label. If the medicine upsets your stomach, take the medicine with food or milk. Do not take more than you are told to take. Talk to your pediatrician regarding the use of this medicine in children. This medicine is not approved for use in children. Patients over 65 years may have a stronger reaction and need a smaller dose. Overdosage: If you think you have taken too much of this medicine contact a poison control center or emergency room at once. NOTE: This medicine is only for you. Do not share this medicine with others. What if I miss a dose? If you miss a dose, take it as soon as you can. If it is almost time for your next dose, take only that dose. Do not take double or extra doses. What may interact with this medicine? -alcohol -antihistamines -isoniazid -medicines for depression, anxiety, or psychotic disturbances -medicines for sleep -muscle relaxants -naltrexone -narcotic medicines (opiates) for pain -phenobarbital -ritonavir -tramadol This list may not describe all possible interactions. Give your health care provider a list of all the medicines, herbs, non-prescription drugs, or dietary supplements you use. Also tell them if  you smoke, drink alcohol, or use illegal drugs. Some items may interact with your medicine. What should I watch for while using this medicine? Tell your doctor or health care professional if your pain does not go away, if it gets worse, or if you have new or a different type of pain. You may develop tolerance to the medicine. Tolerance means that you will need a higher dose of the medicine for pain relief. Tolerance is normal and is expected if you take the medicine for a long time. Do not suddenly stop taking your medicine because you may develop a severe reaction. Your body becomes used to the medicine. This does NOT mean you are addicted. Addiction is a behavior related to getting and using a drug for a non-medical reason. If you have pain, you have a medical  reason to take pain medicine. Your doctor will tell you how much medicine to take. If your doctor wants you to stop the medicine, the dose will be slowly lowered over time to avoid any side effects. You may get drowsy or dizzy when you first start taking the medicine or change doses. Do not drive, use machinery, or do anything that may be dangerous until you know how the medicine affects you. Stand or sit up slowly. There are different types of narcotic medicines (opiates) for pain. If you take more than one type at the same time, you may have more side effects. Give your health care provider a list of all medicines you use. Your doctor will tell you how much medicine to take. Do not take more medicine than directed. Call emergency for help if you have problems breathing. The medicine will cause constipation. Try to have a bowel movement at least every 2 to 3 days. If you do not have a bowel movement for 3 days, call your doctor or health care professional. Too much acetaminophen can be very dangerous. Do not take Tylenol (acetaminophen) or medicines that contain acetaminophen with this medicine. Many non-prescription medicines contain acetaminophen. Always read the labels carefully. What side effects may I notice from receiving this medicine? Side effects that you should report to your doctor or health care professional as soon as possible: -allergic reactions like skin rash, itching or hives, swelling of the face, lips, or tongue -breathing problems -confusion -feeling faint or lightheaded, falls -stomach pain -yellowing of the eyes or skin Side effects that usually do not require medical attention (report to your doctor or health care professional if they continue or are bothersome): -nausea, vomiting -stomach upset This list may not describe all possible side effects. Call your doctor for medical advice about side effects. You may report side effects to FDA at 1-800-FDA-1088. Where should I keep  my medicine? Keep out of the reach of children. This medicine can be abused. Keep your medicine in a safe place to protect it from theft. Do not share this medicine with anyone. Selling or giving away this medicine is dangerous and against the law. This medicine may cause accidental overdose and death if it taken by other adults, children, or pets. Mix any unused medicine with a substance like cat litter or coffee grounds. Then throw the medicine away in a sealed container like a sealed bag or a coffee can with a lid. Do not use the medicine after the expiration date. Store at room temperature between 15 and 30 degrees C (59 and 86 degrees F). NOTE: This sheet is a summary. It may not cover all possible information.  If you have questions about this medicine, talk to your doctor, pharmacist, or health care provider.    2016, Elsevier/Gold Standard. (2014-10-12 15:29:20)  Ondansetron tablets What is this medicine? ONDANSETRON (on DAN se tron) is used to treat nausea and vomiting caused by chemotherapy. It is also used to prevent or treat nausea and vomiting after surgery. This medicine may be used for other purposes; ask your health care provider or pharmacist if you have questions. What should I tell my health care provider before I take this medicine? They need to know if you have any of these conditions: -heart disease -history of irregular heartbeat -liver disease -low levels of magnesium or potassium in the blood -an unusual or allergic reaction to ondansetron, granisetron, other medicines, foods, dyes, or preservatives -pregnant or trying to get pregnant -breast-feeding How should I use this medicine? Take this medicine by mouth with a glass of water. Follow the directions on your prescription label. Take your doses at regular intervals. Do not take your medicine more often than directed. Talk to your pediatrician regarding the use of this medicine in children. Special care may be  needed. Overdosage: If you think you have taken too much of this medicine contact a poison control center or emergency room at once. NOTE: This medicine is only for you. Do not share this medicine with others. What if I miss a dose? If you miss a dose, take it as soon as you can. If it is almost time for your next dose, take only that dose. Do not take double or extra doses. What may interact with this medicine? Do not take this medicine with any of the following medications: -apomorphine -certain medicines for fungal infections like fluconazole, itraconazole, ketoconazole, posaconazole, voriconazole -cisapride -dofetilide -dronedarone -pimozide -thioridazine -ziprasidone This medicine may also interact with the following medications: -carbamazepine -certain medicines for depression, anxiety, or psychotic disturbances -fentanyl -linezolid -MAOIs like Carbex, Eldepryl, Marplan, Nardil, and Parnate -methylene blue (injected into a vein) -other medicines that prolong the QT interval (cause an abnormal heart rhythm) -phenytoin -rifampicin -tramadol This list may not describe all possible interactions. Give your health care provider a list of all the medicines, herbs, non-prescription drugs, or dietary supplements you use. Also tell them if you smoke, drink alcohol, or use illegal drugs. Some items may interact with your medicine. What should I watch for while using this medicine? Check with your doctor or health care professional right away if you have any sign of an allergic reaction. What side effects may I notice from receiving this medicine? Side effects that you should report to your doctor or health care professional as soon as possible: -allergic reactions like skin rash, itching or hives, swelling of the face, lips or tongue -breathing problems -confusion -dizziness -fast or irregular heartbeat -feeling faint or lightheaded, falls -fever and chills -loss of balance or  coordination -seizures -sweating -swelling of the hands or feet -tightness in the chest -tremors -unusually weak or tired Side effects that usually do not require medical attention (report to your doctor or health care professional if they continue or are bothersome): -constipation or diarrhea -headache This list may not describe all possible side effects. Call your doctor for medical advice about side effects. You may report side effects to FDA at 1-800-FDA-1088. Where should I keep my medicine? Keep out of the reach of children. Store between 2 and 30 degrees C (36 and 86 degrees F). Throw away any unused medicine after the expiration date. NOTE: This sheet  is a summary. It may not cover all possible information. If you have questions about this medicine, talk to your doctor, pharmacist, or health care provider.    2016, Elsevier/[employer] Standard. (2013-08-18 16:27:45)

## 2016-02-05 ENCOUNTER — Emergency Department (HOSPITAL_COMMUNITY): Payer: Medicaid Other

## 2016-02-05 ENCOUNTER — Encounter (HOSPITAL_COMMUNITY): Payer: Self-pay | Admitting: Emergency Medicine

## 2016-02-05 ENCOUNTER — Emergency Department (HOSPITAL_COMMUNITY)
Admission: EM | Admit: 2016-02-05 | Discharge: 2016-02-05 | Disposition: A | Discharge status: Home / Self Care | Payer: Medicaid Other | Attending: Emergency Medicine | Admitting: Emergency Medicine

## 2016-02-05 DIAGNOSIS — S6991XA Unspecified injury of right wrist, hand and finger(s), initial encounter: Secondary | ICD-10-CM | POA: Diagnosis present

## 2016-02-05 DIAGNOSIS — S61219A Laceration without foreign body of unspecified finger without damage to nail, initial encounter: Secondary | ICD-10-CM

## 2016-02-05 DIAGNOSIS — W268XXA Contact with other sharp object(s), not elsewhere classified, initial encounter: Secondary | ICD-10-CM | POA: Insufficient documentation

## 2016-02-05 DIAGNOSIS — F419 Anxiety disorder, unspecified: Secondary | ICD-10-CM | POA: Insufficient documentation

## 2016-02-05 DIAGNOSIS — Z23 Encounter for immunization: Secondary | ICD-10-CM | POA: Insufficient documentation

## 2016-02-05 DIAGNOSIS — Y9389 Activity, other specified: Secondary | ICD-10-CM | POA: Insufficient documentation

## 2016-02-05 DIAGNOSIS — F329 Major depressive disorder, single episode, unspecified: Secondary | ICD-10-CM | POA: Insufficient documentation

## 2016-02-05 DIAGNOSIS — Z862 Personal history of diseases of the blood and blood-forming organs and certain disorders involving the immune mechanism: Secondary | ICD-10-CM | POA: Insufficient documentation

## 2016-02-05 DIAGNOSIS — Z7951 Long term (current) use of inhaled steroids: Secondary | ICD-10-CM | POA: Diagnosis not present

## 2016-02-05 DIAGNOSIS — Z792 Long term (current) use of antibiotics: Secondary | ICD-10-CM | POA: Diagnosis not present

## 2016-02-05 DIAGNOSIS — Z79899 Other long term (current) drug therapy: Secondary | ICD-10-CM | POA: Insufficient documentation

## 2016-02-05 DIAGNOSIS — Z8619 Personal history of other infectious and parasitic diseases: Secondary | ICD-10-CM | POA: Insufficient documentation

## 2016-02-05 DIAGNOSIS — S61212A Laceration without foreign body of right middle finger without damage to nail, initial encounter: Secondary | ICD-10-CM | POA: Insufficient documentation

## 2016-02-05 DIAGNOSIS — Z793 Long term (current) use of hormonal contraceptives: Secondary | ICD-10-CM | POA: Diagnosis not present

## 2016-02-05 DIAGNOSIS — Z87891 Personal history of nicotine dependence: Secondary | ICD-10-CM | POA: Diagnosis not present

## 2016-02-05 DIAGNOSIS — Z880 Allergy status to penicillin: Secondary | ICD-10-CM | POA: Insufficient documentation

## 2016-02-05 DIAGNOSIS — Y9289 Other specified places as the place of occurrence of the external cause: Secondary | ICD-10-CM | POA: Diagnosis not present

## 2016-02-05 DIAGNOSIS — Y998 Other external cause status: Secondary | ICD-10-CM | POA: Diagnosis not present

## 2016-02-05 MED ORDER — IBUPROFEN 200 MG PO TABS
400.0000 mg | ORAL_TABLET | Freq: Once | ORAL | Status: AC
  Administered 2016-02-05: 400 mg via ORAL
  Filled 2016-02-05: qty 2

## 2016-02-05 MED ORDER — TETANUS-DIPHTH-ACELL PERTUSSIS 5-2.5-18.5 LF-MCG/0.5 IM SUSP
0.5000 mL | Freq: Once | INTRAMUSCULAR | Status: AC
  Administered 2016-02-05: 0.5 mL via INTRAMUSCULAR
  Filled 2016-02-05: qty 0.5

## 2016-02-05 NOTE — ED Notes (Signed)
Pt rounded on while in the lobby.  Pain medication offered.  Pt accepted offer but stated that she would be driving so order for ibuprofen was placed.  Pt brought back to triage room where this nurse reassessed pt's injury and bandaged her wound.  Very small appx 0.5 cm laceration noted to end of finger.  Bleeding controlled.  2 warm blankets given to both pt and her visitor.

## 2016-02-05 NOTE — ED Notes (Signed)
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  States that she was cleaning the bathroom mirror and sliced her finger on the metal bracket holding the mirror in place.  Rt hand, middle finger, end of finger.  Bleeding controlled.

## 2016-02-05 NOTE — Discharge Instructions (Signed)
Finger Fracture  Fractures of fingers are breaks in the bones of the fingers. There are many types of fractures. There are different ways of treating these fractures. Your health care provider will discuss the best way to treat your fracture.  CAUSES  Traumatic injury is the main cause of broken fingers. These include:  · Injuries while playing sports.  · Workplace injuries.  · Falls.  RISK FACTORS  Activities that can increase your risk of finger fractures include:  · Sports.  · Workplace activities that involve machinery.  · A condition called osteoporosis, which can make your bones less dense and cause them to fracture more easily.  SIGNS AND SYMPTOMS  The main symptoms of a broken finger are pain and swelling within 15 minutes after the injury. Other symptoms include:  · Bruising of your finger.  · Stiffness of your finger.  · Numbness of your finger.  · Exposed bones (compound fracture) if the fracture is severe.  DIAGNOSIS   The best way to diagnose a broken bone is with X-ray imaging. Additionally, your health care provider will use this X-ray image to evaluate the position of the broken finger bones.   TREATMENT   Finger fractures can be treated with:   · Nonreduction--This means the bones are in place. The finger is splinted without changing the positions of the bone pieces. The splint is usually left on for about a week to 10 days. This will depend on your fracture and what your health care provider thinks.  · Closed reduction--The bones are put back into position without using surgery. The finger is then splinted.  · Open reduction and internal fixation--The fracture site is opened. Then the bone pieces are fixed into place with pins or some type of hardware. This is seldom required. It depends on the severity of the fracture.  HOME CARE INSTRUCTIONS   · Follow your health care provider's instructions regarding activities, exercises, and physical therapy.  · Only take over-the-counter or prescription  medicines for pain, discomfort, or fever as directed by your health care provider.  SEEK MEDICAL CARE IF:  You have pain or swelling that limits the motion or use of your fingers.  SEEK IMMEDIATE MEDICAL CARE IF:   Your finger becomes numb.  MAKE SURE YOU:   · Understand these instructions.  · Will watch your condition.  · Will get help right away if you are not doing well or get worse.     This information is not intended to replace advice given to you by your health care provider. Make sure you discuss any questions you have with your health care provider.     Document Released: 02/23/2001 Document Revised: 09/01/2013 Document Reviewed: 06/23/2013  Elsevier Interactive Patient Education ©2016 [employer]  Laceration Care, Adult  A laceration is a cut that goes through all of the layers of the skin and into the tissue that is right under the skin. Some lacerations heal on their own. Others need to be closed with stitches (sutures), staples, skin adhesive strips, or skin glue. Proper laceration care minimizes the risk of infection and helps the laceration to heal better.  HOW TO CARE FOR YOUR LACERATION  If sutures or staples were used:  · Keep the wound clean and dry.  · If you were given a bandage (dressing), you should change it at least one time per day or as told by your health care provider. You should also change it if it becomes wet or dirty.  ·   Keep the wound completely dry for the first 24 hours or as told by your health care provider. After that time, you may shower or bathe. However, make sure that the wound is not soaked in water until after the sutures or staples have been removed.  · Clean the wound one time each day or as told by your health care provider:    Wash the wound with soap and water.    Rinse the wound with water to remove all soap.    Pat the wound dry with a clean towel. Do not rub the wound.  · After cleaning the wound, apply a thin layer of antibiotic ointment as told by your health  care provider. This will help to prevent infection and keep the dressing from sticking to the wound.  · Have the sutures or staples removed as told by your health care provider.  If skin adhesive strips were used:  · Keep the wound clean and dry.  · If you were given a bandage (dressing), you should change it at least one time per day or as told by your health care provider. You should also change it if it becomes dirty or wet.  · Do not get the skin adhesive strips wet. You may shower or bathe, but be careful to keep the wound dry.  · If the wound gets wet, pat it dry with a clean towel. Do not rub the wound.  · Skin adhesive strips fall off on their own. You may trim the strips as the wound heals. Do not remove skin adhesive strips that are still stuck to the wound. They will fall off in time.  If skin glue was used:  · Try to keep the wound dry, but you may briefly wet it in the shower or bath. Do not soak the wound in water, such as by swimming.  · After you have showered or bathed, gently pat the wound dry with a clean towel. Do not rub the wound.  · Do not do any activities that will make you sweat heavily until the skin glue has fallen off on its own.  · Do not apply liquid, cream, or ointment medicine to the wound while the skin glue is in place. Using those may loosen the film before the wound has healed.  · If you were given a bandage (dressing), you should change it at least one time per day or as told by your health care provider. You should also change it if it becomes dirty or wet.  · If a dressing is placed over the wound, be careful not to apply tape directly over the skin glue. Doing that may cause the glue to be pulled off before the wound has healed.  · Do not pick at the glue. The skin glue usually remains in place for 5-10 days, then it falls off of the skin.  General Instructions  · Take over-the-counter and prescription medicines only as told by your health care provider.  · If you were  prescribed an antibiotic medicine or ointment, take or apply it as told by your doctor. Do not stop using it even if your condition improves.  · To help prevent scarring, make sure to cover your wound with sunscreen whenever you are outside after stitches are removed, after adhesive strips are removed, or when glue remains in place and the wound is healed. Make sure to wear a sunscreen of at least 30 SPF.  · Do not scratch or pick   at the wound.  · Keep all follow-up visits as told by your health care provider. This is important.  · Check your wound every day for signs of infection. Watch for:    Redness, swelling, or pain.    Fluid, blood, or pus.  · Raise (elevate) the injured area above the level of your heart while you are sitting or lying down, if possible.  SEEK MEDICAL CARE IF:  · You received a tetanus shot and you have swelling, severe pain, redness, or bleeding at the injection site.  · You have a fever.  · A wound that was closed breaks open.  · You notice a bad smell coming from your wound or your dressing.  · You notice something coming out of the wound, such as wood or glass.  · Your pain is not controlled with medicine.  · You have increased redness, swelling, or pain at the site of your wound.  · You have fluid, blood, or pus coming from your wound.  · You notice a change in the color of your skin near your wound.  · You need to change the dressing frequently due to fluid, blood, or pus draining from the wound.  · You develop a new rash.  · You develop numbness around the wound.  SEEK IMMEDIATE MEDICAL CARE IF:  · You develop severe swelling around the wound.  · Your pain suddenly increases and is severe.  · You develop painful lumps near the wound or on skin that is anywhere on your body.  · You have a red streak going away from your wound.  · The wound is on your hand or foot and you cannot properly move a finger or toe.  · The wound is on your hand or foot and you notice that your fingers or toes  look pale or bluish.     This information is not intended to replace advice given to you by your health care provider. Make sure you discuss any questions you have with your health care provider.     Document Released: 11/11/2005 Document Revised: 03/28/2015 Document Reviewed: 11/07/2014  Elsevier Interactive Patient Education ©2016 [employer]

## 2016-02-05 NOTE — ED Provider Notes (Signed)
CSN: [id number]     Arrival date & time 02/05/16  0057 History   First MD Initiated Contact with Patient 02/05/16 0301     Chief Complaint  Patient presents with  . Extremity Laceration     (Consider location/radiation/quality/duration/timing/severity/associated sxs/prior Treatment) Patient is a 35 y.o. female presenting with hand pain. The history is provided by the patient. No language interpreter was used.  Hand Pain This is a new problem. The current episode started today. The problem occurs constantly. The problem has been unchanged. Pertinent negatives include no numbness. Nothing aggravates the symptoms. The treatment provided no relief.   Pt cut her finger on a metal edge of a mirror.  Pt reports bleeding a lot initially Past Medical History  Diagnosis Date  . Anemia   . Chlamydia   . Headache   . Depression   . Anxiety    Past Surgical History  Procedure Laterality Date  . Cesarean section    . Appendectomy    . Tonsillectomy    . Cesarean section      x4  . Tubal ligation    . Cholecystectomy N/A 12/24/2014    Procedure: LAPAROSCOPIC CHOLECYSTECTOMY WITH INTRAOPERATIVE CHOLANGIOGRAM;  Surgeon: Glenna Fellows, MD;  Location: [facility];  Service: General;  Laterality: N/A;  . Adenoidectomy    . Lymphadenectomy     Family History  Problem Relation Age of Onset  . Diabetes Mother   . Hypertension Mother   . CAD Mother    Social History  Substance Use Topics  . Smoking status: Former Smoker -- 0.50 packs/day    Types: Cigarettes    Quit date: 11/25/2014  . Smokeless tobacco: Never Used  . Alcohol Use: No     Comment: former   OB History    Gravida Para Term Preterm AB TAB SAB Ectopic Multiple Living   Review of Systems  Neurological: Negative for numbness.  All other systems reviewed and are negative.     Allergies  Chantix; Percocet; and Penicillins  Home Medications   Prior to Admission medications   Medication Sig Start  Date End Date Taking? Authorizing Provider  albuterol (PROVENTIL HFA;VENTOLIN HFA) 108 (90 BASE) MCG/ACT inhaler Inhale 2 puffs into the lungs every 4 (four) hours as needed for wheezing or shortness of breath. 07/25/14   Shon Baton, MD  busPIRone (BUSPAR) 5 MG tablet Take 5 mg by mouth daily.    Historical Provider, MD  cetirizine (ZYRTEC) 10 MG tablet Take 10 mg by mouth daily as needed for allergies.  03/24/15   Historical Provider, MD  ciprofloxacin (CIPRO) 500 MG tablet Take 1 tablet (500 mg total) by mouth 2 (two) times daily. 12/16/15   Dione Booze, MD  EPINEPHrine (EPIPEN 2-PAK) 0.3 mg/0.3 mL IJ SOAJ injection Inject 1 Dose as directed as needed. 08/19/14   Historical Provider, MD  famotidine (PEPCID) 20 MG tablet Take 1 tablet (20 mg total) by mouth 2 (two) times daily. 12/14/14   Saraih Palumbo, MD  FLUoxetine (PROZAC) 20 MG capsule Take 40 mg by mouth at bedtime.     Historical Provider, MD  Fluticasone-Salmeterol (ADVAIR) 250-50 MCG/DOSE AEPB Inhale 1 puff into the lungs 2 (two) times daily.    Historical Provider, MD  HYDROcodone-acetaminophen (NORCO) 5-325 MG tablet Take 1 tablet by mouth every 4 (four) hours as needed for moderate pain. 12/16/15   Dione Booze, MD  ipratropium-albuterol (DUONEB) 0.5-2.5 (3)  MG/3ML SOLN Take 3 mLs by nebulization every 2 (two) hours as needed. 10/03/15   Stevi Barrett, PA-C  Levonorgestrel-Ethinyl Estradiol (SEASONIQUE) 0.15-0.03 &0.01 MG tablet Take 1 tablet by mouth daily.      Historical Provider, MD  metroNIDAZOLE (FLAGYL) 500 MG tablet Take 1 tablet (500 mg total) by mouth 3 (three) times daily. 12/16/15   Dione Boozeavid Glick, MD  montelukast (SINGULAIR) 10 MG tablet Take 10 mg by mouth at bedtime.    Historical Provider, MD  Olopatadine HCl (PATADAY) 0.2 % SOLN Place 2 drops into both eyes as needed (allergies).  08/19/14   Historical Provider, MD  omeprazole (PRILOSEC) 40 MG capsule Take 40 mg by mouth daily. 08/19/14   Historical Provider, MD  ondansetron  (ZOFRAN) 4 MG tablet Take 1 tablet (4 mg total) by mouth every 6 (six) hours. 12/16/15   Dione Boozeavid Glick, MD  PATANASE 0.6 % SOLN Place 2 sprays into the nose 2 (two) times daily. 03/27/15   Historical Provider, MD  rizatriptan (MAXALT-MLT) 5 MG disintegrating tablet Take 1 tablet (5 mg total) by mouth as needed. May repeat in 2 hours if needed 09/19/15   Levert FeinsteinYijun Yan, MD  rOPINIRole (REQUIP) 1 MG tablet Take 1 mg by mouth at bedtime.    Historical Provider, MD  topiramate (TOPAMAX) 100 MG tablet 1/2 tab po bid xone week then one po bid Patient taking differently: Take 100 mg by mouth daily.  09/19/15   Levert FeinsteinYijun Yan, MD  traZODone (DESYREL) 100 MG tablet Take 50-100 mg by mouth at bedtime. 04/04/15   Historical Provider, MD   BP 120/85 mmHg  Pulse 93  Temp(Src) 98 F (36.7 C) (Oral)  Resp 18  SpO2 100% Physical Exam  Constitutional: She is oriented to person, place, and time. She appears well-developed and well-nourished.  HENT:  Head: Normocephalic.  Eyes: EOM are normal.  Neck: Normal range of motion.  Pulmonary/Chest: Effort normal.  Abdominal: She exhibits no distension.  Musculoskeletal: Normal range of motion.  Laceration distal tip of finger,  No gapping 5mm. nv and ns intact  Neurological: She is alert and oriented to person, place, and time.  Psychiatric: She has a normal mood and affect.  Nursing note and vitals reviewed.   ED Course  Procedures (including critical care time) Labs Review Labs Reviewed - No data to display  Imaging Review Dg Finger Middle Right  02/05/2016  CLINICAL DATA:  Laceration at the distal right middle finger on cabinet. Initial encounter. EXAM: RIGHT MIDDLE FINGER 2+V COMPARISON:  None. FINDINGS: Slight cortical irregularity along the distal tuft of the third distal phalanx may reflect a small fracture. Overlying soft tissue disruption is noted. No radiopaque foreign body is seen. Visualized joint spaces are preserved. IMPRESSION: Slight cortical irregularity  along the distal tuft of the third distal phalanx may reflect a small fracture. No radiopaque foreign bodies seen. Electronically Signed   By: Roanna RaiderJeffery  Chang M.D.   On: 02/05/2016 02:26   I have personally reviewed and evaluated these images and lab results as part of my medical decision-making.   EKG Interpretation None      MDM  i don't think this is an open fracture.  I will have pt follow up with Dr. Mina Marbleweingold.   Final diagnoses:  Laceration of finger, right, initial encounter    steristrip Finger splint Follow up with Dr. Mina MarbleWeingold for evalaution    Elson AreasLeslie K Sofia, PA-C 02/05/16 0345  Lonia SkinnerLeslie K HoutzdaleSofia, PA-C 02/05/16 0350  Raeford RazorStephen Kohut, MD 02/05/16  0832 

## 2016-03-29 ENCOUNTER — Encounter (HOSPITAL_COMMUNITY): Payer: Self-pay

## 2016-03-29 ENCOUNTER — Emergency Department (HOSPITAL_COMMUNITY)
Admission: EM | Admit: 2016-03-29 | Discharge: 2016-03-29 | Disposition: A | Discharge status: Home / Self Care | Payer: Medicaid Other | Attending: Emergency Medicine | Admitting: Emergency Medicine

## 2016-03-29 DIAGNOSIS — F329 Major depressive disorder, single episode, unspecified: Secondary | ICD-10-CM | POA: Diagnosis not present

## 2016-03-29 DIAGNOSIS — H9202 Otalgia, left ear: Secondary | ICD-10-CM | POA: Diagnosis present

## 2016-03-29 DIAGNOSIS — Z79891 Long term (current) use of opiate analgesic: Secondary | ICD-10-CM | POA: Diagnosis not present

## 2016-03-29 DIAGNOSIS — H6692 Otitis media, unspecified, left ear: Secondary | ICD-10-CM | POA: Diagnosis not present

## 2016-03-29 DIAGNOSIS — Z79899 Other long term (current) drug therapy: Secondary | ICD-10-CM | POA: Diagnosis not present

## 2016-03-29 DIAGNOSIS — Z7951 Long term (current) use of inhaled steroids: Secondary | ICD-10-CM | POA: Insufficient documentation

## 2016-03-29 DIAGNOSIS — Z87891 Personal history of nicotine dependence: Secondary | ICD-10-CM | POA: Insufficient documentation

## 2016-03-29 DIAGNOSIS — Z793 Long term (current) use of hormonal contraceptives: Secondary | ICD-10-CM | POA: Insufficient documentation

## 2016-03-29 DIAGNOSIS — Z792 Long term (current) use of antibiotics: Secondary | ICD-10-CM | POA: Diagnosis not present

## 2016-03-29 MED ORDER — HYDROCODONE-ACETAMINOPHEN 5-325 MG PO TABS
1.0000 | ORAL_TABLET | Freq: Once | ORAL | Status: AC
  Administered 2016-03-29: 1 via ORAL
  Filled 2016-03-29: qty 1

## 2016-03-29 MED ORDER — CEFDINIR 300 MG PO CAPS: 300.0000 mg | ORAL_CAPSULE | Freq: Two times a day (BID) | ORAL | Status: DC

## 2016-03-29 NOTE — ED Provider Notes (Signed)
CSN: [id number]     Arrival date & time 03/29/16  1533 History  By signing my name below, I, Marisue Humble, attest that this documentation has been prepared under the direction and in the presence of non-physician practitioner, Gaylyn Rong PA-C. Electronically Signed: Marisue Humble, Scribe. 03/29/2016. 4:25 PM.   Chief Complaint  Patient presents with  . Otalgia   The history is provided by the patient. No language interpreter was used.   HPI Comments:  Crystal Myers is a 35 y.o. female who presents to the Emergency Department complaining of worsening left ear pain onset 5 days ago, beginning as tingling and progressing to pain. Pt reports associated numbness and pain in neck, shoulder and face. Pt notes fever tmax 102.2 last night and tingling in right ear onset today. She has taken Tylenol without relief. Pt reports left cervical lymphadenectomy less than a year ago. Denies nasal congestion, recent illnesses, h/o of tubes in her ears or h/o DM.  Past Medical History  Diagnosis Date  . Anemia   . Chlamydia   . Headache   . Depression   . Anxiety    Past Surgical History  Procedure Laterality Date  . Cesarean section    . Appendectomy    . Tonsillectomy    . Cesarean section      x4  . Tubal ligation    . Cholecystectomy N/A 12/24/2014    Procedure: LAPAROSCOPIC CHOLECYSTECTOMY WITH INTRAOPERATIVE CHOLANGIOGRAM;  Surgeon: Glenna Fellows, MD;  Location: [facility];  Service: General;  Laterality: N/A;  . Adenoidectomy    . Lymphadenectomy     Family History  Problem Relation Age of Onset  . Diabetes Mother   . Hypertension Mother   . CAD Mother    Social History  Substance Use Topics  . Smoking status: Former Smoker -- 0.50 packs/day    Types: Cigarettes    Quit date: 11/25/2014  . Smokeless tobacco: Never Used  . Alcohol Use: No     Comment: former   OB History    Gravida Para Term Preterm AB TAB SAB Ectopic Multiple Living   Review of  Systems  Constitutional: Positive for fever.  HENT: Positive for ear pain. Negative for congestion.   Musculoskeletal: Positive for neck pain.  All other systems reviewed and are negative.  Allergies  Chantix; Percocet; and Penicillins  Home Medications   Prior to Admission medications   Medication Sig Start Date End Date Taking? Authorizing Provider  albuterol (PROVENTIL HFA;VENTOLIN HFA) 108 (90 BASE) MCG/ACT inhaler Inhale 2 puffs into the lungs every 4 (four) hours as needed for wheezing or shortness of breath. 07/25/14   Shon Baton, MD  busPIRone (BUSPAR) 5 MG tablet Take 5 mg by mouth daily.    Historical Provider, MD  cetirizine (ZYRTEC) 10 MG tablet Take 10 mg by mouth daily as needed for allergies.  03/24/15   Historical Provider, MD  ciprofloxacin (CIPRO) 500 MG tablet Take 1 tablet (500 mg total) by mouth 2 (two) times daily. 12/16/15   Dione Booze, MD  EPINEPHrine (EPIPEN 2-PAK) 0.3 mg/0.3 mL IJ SOAJ injection Inject 1 Dose as directed as needed. 08/19/14   Historical Provider, MD  famotidine (PEPCID) 20 MG tablet Take 1 tablet (20 mg total) by mouth 2 (two) times daily. 12/14/14   Rowynn Palumbo, MD  FLUoxetine (PROZAC) 20 MG capsule Take 40 mg by mouth at bedtime.  Historical Provider, MD  Fluticasone-Salmeterol (ADVAIR) 250-50 MCG/DOSE AEPB Inhale 1 puff into the lungs 2 (two) times daily.    Historical Provider, MD  HYDROcodone-acetaminophen (NORCO) 5-325 MG tablet Take 1 tablet by mouth every 4 (four) hours as needed for moderate pain. 12/16/15   Dione Booze, MD  ipratropium-albuterol (DUONEB) 0.5-2.5 (3) MG/3ML SOLN Take 3 mLs by nebulization every 2 (two) hours as needed. 10/03/15   Stevi Barrett, PA-C  Levonorgestrel-Ethinyl Estradiol (SEASONIQUE) 0.15-0.03 &0.01 MG tablet Take 1 tablet by mouth daily.      Historical Provider, MD  metroNIDAZOLE (FLAGYL) 500 MG tablet Take 1 tablet (500 mg total) by mouth 3 (three) times daily. 12/16/15   Dione Booze, MD  montelukast  (SINGULAIR) 10 MG tablet Take 10 mg by mouth at bedtime.    Historical Provider, MD  Olopatadine HCl (PATADAY) 0.2 % SOLN Place 2 drops into both eyes as needed (allergies).  08/19/14   Historical Provider, MD  omeprazole (PRILOSEC) 40 MG capsule Take 40 mg by mouth daily. 08/19/14   Historical Provider, MD  ondansetron (ZOFRAN) 4 MG tablet Take 1 tablet (4 mg total) by mouth every 6 (six) hours. 12/16/15   Dione Booze, MD  PATANASE 0.6 % SOLN Place 2 sprays into the nose 2 (two) times daily. 03/27/15   Historical Provider, MD  rizatriptan (MAXALT-MLT) 5 MG disintegrating tablet Take 1 tablet (5 mg total) by mouth as needed. May repeat in 2 hours if needed 09/19/15   Levert Feinstein, MD  rOPINIRole (REQUIP) 1 MG tablet Take 1 mg by mouth at bedtime.    Historical Provider, MD  topiramate (TOPAMAX) 100 MG tablet 1/2 tab po bid xone week then one po bid Patient taking differently: Take 100 mg by mouth daily.  09/19/15   Levert Feinstein, MD  traZODone (DESYREL) 100 MG tablet Take 50-100 mg by mouth at bedtime. 04/04/15   Historical Provider, MD   BP 129/87 mmHg  Pulse 85  Temp(Src) 97.9 F (36.6 C) (Oral)  Resp 18  SpO2 100% Physical Exam  Constitutional: She is oriented to person, place, and time. She appears well-developed and well-nourished. No distress.  HENT:  Head: Normocephalic and atraumatic.  Right Ear: Tympanic membrane normal.  Left Ear: No mastoid tenderness. A middle ear effusion is present.  Eyes: Conjunctivae are normal. Right eye exhibits no discharge. Left eye exhibits no discharge. No scleral icterus.  Neck: Neck supple.  No meningismus  Cardiovascular: Normal rate.   Pulmonary/Chest: Effort normal.  Lymphadenopathy:    She has no cervical adenopathy.  Neurological: She is alert and oriented to person, place, and time. Coordination normal.  Skin: Skin is warm and dry. No rash noted. She is not diaphoretic. No erythema. No pallor.  Psychiatric: She has a normal mood and affect. Her  behavior is normal.  Nursing note and vitals reviewed.   ED Course  Procedures  DIAGNOSTIC STUDIES:  Oxygen Saturation is 100% on RA, normal by my interpretation.    COORDINATION OF CARE:  4:22 PM Will prescribe antibiotics and pain medication. Recommended follow-up with PCP next week. Discussed treatment plan with pt at bedside and pt agreed to plan.  Labs Review Labs Reviewed - No data to display  Imaging Review No results found. I have personally reviewed and evaluated these images and lab results as part of my medical decision-making.   EKG Interpretation None      MDM   Final diagnoses:  Acute left otitis media, recurrence not specified, unspecified otitis media  type   Patient presents with otalgia and exam consistent with acute otitis media. No concern for acute mastoiditis, meningitis.  Patient discharged home with omnicef. Pt has allergy to PCN but states that she has had Keflex previously without difficulty.  Advised patient to follow-up with PCP.  I have also discussed reasons to return immediately to the ER.  Patient expresses understanding and agrees with plan. Pt appears safe for discharge.    I personally performed the services described in this documentation, which was scribed in my presence. The recorded information has been reviewed and is accurate.     Lester KinsmanSamantha Tripp RaymondDowless, PA-C 03/29/16 1638  Lorre NickAnthony Allen, MD 03/30/16 2253

## 2016-03-29 NOTE — ED Notes (Signed)
Pt presents with c/o left ear pain since Monday. Pt reports the pain is so bad that it is causing her pain in her neck, shoulder, and face. No distress noted at this time, no drainage noted from the area.

## 2016-03-29 NOTE — Discharge Instructions (Signed)
Otitis Media, Adult Otitis media is redness, soreness, and puffiness (swelling) in the space just behind your eardrum (middle ear). It may be caused by allergies or infection. It often happens along with a cold. HOME CARE  Take your medicine as told. Finish it even if you start to feel better.  Only take over-the-counter or prescription medicines for pain, discomfort, or fever as told by your doctor.  Follow up with your doctor as told. GET HELP IF:  You have otitis media only in one ear, or bleeding from your nose, or both.  You notice a lump on your neck.  You are not getting better in 3-5 days.  You feel worse instead of better. GET HELP RIGHT AWAY IF:   You have pain that is not helped with medicine.  You have puffiness, redness, or pain around your ear.  You get a stiff neck.  You cannot move part of your face (paralysis).  You notice that the bone behind your ear hurts when you touch it. MAKE SURE YOU:   Understand these instructions.  Will watch your condition.  Will get help right away if you are not doing well or get worse.   This information is not intended to replace advice given to you by your health care provider. Make sure you discuss any questions you have with your health care provider.   Take antibiotics as prescribed. Follow-up with your primary care doctor next week. Return to the ED if you experience severe worsening of your symptoms, increased pain, drainage from her ear, pain in the bone behind your ear, fevers, chills, difficulty breathing or swallowing.

## 2016-05-20 ENCOUNTER — Ambulatory Visit: Payer: Medicaid Other | Admitting: Allergy and Immunology

## 2016-05-21 ENCOUNTER — Encounter (HOSPITAL_COMMUNITY): Payer: Self-pay | Admitting: *Deleted

## 2016-05-21 ENCOUNTER — Emergency Department (HOSPITAL_COMMUNITY): Payer: Medicaid Other

## 2016-05-21 ENCOUNTER — Emergency Department (HOSPITAL_COMMUNITY)
Admission: EM | Admit: 2016-05-21 | Discharge: 2016-05-21 | Disposition: A | Discharge status: Home / Self Care | Payer: Medicaid Other | Attending: Emergency Medicine | Admitting: Emergency Medicine

## 2016-05-21 DIAGNOSIS — N888 Other specified noninflammatory disorders of cervix uteri: Secondary | ICD-10-CM | POA: Diagnosis not present

## 2016-05-21 DIAGNOSIS — Z87891 Personal history of nicotine dependence: Secondary | ICD-10-CM | POA: Insufficient documentation

## 2016-05-21 DIAGNOSIS — F329 Major depressive disorder, single episode, unspecified: Secondary | ICD-10-CM | POA: Insufficient documentation

## 2016-05-21 DIAGNOSIS — R1011 Right upper quadrant pain: Secondary | ICD-10-CM

## 2016-05-21 DIAGNOSIS — N390 Urinary tract infection, site not specified: Secondary | ICD-10-CM

## 2016-05-21 DIAGNOSIS — Z79899 Other long term (current) drug therapy: Secondary | ICD-10-CM | POA: Insufficient documentation

## 2016-05-21 LAB — CBC WITH DIFFERENTIAL/PLATELET
BASOS ABS: 0 10*3/uL (ref 0.0–0.1)
BASOS PCT: 0 %
EOS ABS: 0.2 10*3/uL (ref 0.0–0.7)
Eosinophils Relative: 3 %
HCT: 35.7 % — ABNORMAL LOW (ref 36.0–46.0)
HEMOGLOBIN: 11.2 g/dL — AB (ref 12.0–15.0)
LYMPHS ABS: 2.5 10*3/uL (ref 0.7–4.0)
Lymphocytes Relative: 34 %
MCH: 19 pg — AB (ref 26.0–34.0)
MCHC: 31.4 g/dL (ref 30.0–36.0)
MCV: 60.6 fL — ABNORMAL LOW (ref 78.0–100.0)
MONO ABS: 0.4 10*3/uL (ref 0.1–1.0)
Monocytes Relative: 5 %
NEUTROS ABS: 4.3 10*3/uL (ref 1.7–7.7)
Neutrophils Relative %: 58 %
Platelets: 275 10*3/uL (ref 150–400)
RBC: 5.89 MIL/uL — ABNORMAL HIGH (ref 3.87–5.11)
RDW: 15.4 % (ref 11.5–15.5)
WBC: 7.4 10*3/uL (ref 4.0–10.5)

## 2016-05-21 LAB — URINALYSIS, ROUTINE W REFLEX MICROSCOPIC
Bilirubin Urine: NEGATIVE
Glucose, UA: NEGATIVE mg/dL
HGB URINE DIPSTICK: NEGATIVE
KETONES UR: NEGATIVE mg/dL
Nitrite: POSITIVE — AB
PROTEIN: NEGATIVE mg/dL
Specific Gravity, Urine: 1.029 (ref 1.005–1.030)
pH: 5.5 (ref 5.0–8.0)

## 2016-05-21 LAB — COMPREHENSIVE METABOLIC PANEL
ALBUMIN: 4.1 g/dL (ref 3.5–5.0)
ALK PHOS: 59 U/L (ref 38–126)
ALT: 23 U/L (ref 14–54)
AST: 20 U/L (ref 15–41)
Anion gap: 8 (ref 5–15)
BILIRUBIN TOTAL: 0.8 mg/dL (ref 0.3–1.2)
BUN: 10 mg/dL (ref 6–20)
CALCIUM: 8.7 mg/dL — AB (ref 8.9–10.3)
CO2: 23 mmol/L (ref 22–32)
CREATININE: 0.54 mg/dL (ref 0.44–1.00)
Chloride: 106 mmol/L (ref 101–111)
GFR calc Af Amer: >60 mL/min (ref >60)
GLUCOSE: 78 mg/dL (ref 65–99)
POTASSIUM: 3.8 mmol/L (ref 3.5–5.1)
Sodium: 137 mmol/L (ref 135–145)
TOTAL PROTEIN: 7.6 g/dL (ref 6.5–8.1)

## 2016-05-21 LAB — WET PREP, GENITAL
Clue Cells Wet Prep HPF POC: NONE SEEN
SPERM: NONE SEEN
Trich, Wet Prep: NONE SEEN
YEAST WET PREP: NONE SEEN

## 2016-05-21 LAB — PREGNANCY, URINE: PREG TEST UR: NEGATIVE

## 2016-05-21 LAB — URINE MICROSCOPIC-ADD ON

## 2016-05-21 LAB — LIPASE, BLOOD: LIPASE: 34 U/L (ref 11–51)

## 2016-05-21 MED ORDER — ONDANSETRON 4 MG PO TBDP: 4.0000 mg | ORAL_TABLET | ORAL | Status: DC | PRN

## 2016-05-21 MED ORDER — OMEPRAZOLE 20 MG PO CPDR: 20.0000 mg | DELAYED_RELEASE_CAPSULE | Freq: Every day | ORAL | Status: DC

## 2016-05-21 MED ORDER — IOPAMIDOL (ISOVUE-300) INJECTION 61%
100.0000 mL | Freq: Once | INTRAVENOUS | Status: AC | PRN
  Administered 2016-05-21: 100 mL via INTRAVENOUS

## 2016-05-21 MED ORDER — PANTOPRAZOLE SODIUM 40 MG PO TBEC
40.0000 mg | DELAYED_RELEASE_TABLET | Freq: Once | ORAL | Status: AC
  Administered 2016-05-21: 40 mg via ORAL
  Filled 2016-05-21: qty 1

## 2016-05-21 MED ORDER — SODIUM CHLORIDE 0.9 % IV BOLUS (SEPSIS)
1000.0000 mL | Freq: Once | INTRAVENOUS | Status: AC
  Administered 2016-05-21: 1000 mL via INTRAVENOUS

## 2016-05-21 MED ORDER — HYDROMORPHONE HCL 1 MG/ML IJ SOLN
1.0000 mg | Freq: Once | INTRAMUSCULAR | Status: AC
  Administered 2016-05-21: 1 mg via INTRAVENOUS
  Filled 2016-05-21: qty 1

## 2016-05-21 MED ORDER — SULFAMETHOXAZOLE-TRIMETHOPRIM 800-160 MG PO TABS
1.0000 | ORAL_TABLET | Freq: Once | ORAL | Status: AC
  Administered 2016-05-21: 1 via ORAL
  Filled 2016-05-21: qty 1

## 2016-05-21 MED ORDER — HYDROCODONE-ACETAMINOPHEN 5-325 MG PO TABS: 1.0000 | ORAL_TABLET | ORAL | Status: DC | PRN

## 2016-05-21 MED ORDER — ONDANSETRON HCL 4 MG/2ML IJ SOLN
4.0000 mg | Freq: Once | INTRAMUSCULAR | Status: AC
  Administered 2016-05-21: 4 mg via INTRAVENOUS
  Filled 2016-05-21: qty 2

## 2016-05-21 MED ORDER — HYDROMORPHONE HCL 1 MG/ML IJ SOLN
1.0000 mg | Freq: Once | INTRAMUSCULAR | Status: DC
  Filled 2016-05-21: qty 1

## 2016-05-21 MED ORDER — SULFAMETHOXAZOLE-TRIMETHOPRIM 800-160 MG PO TABS: 1.0000 | ORAL_TABLET | Freq: Two times a day (BID) | ORAL | Status: AC

## 2016-05-21 NOTE — ED Notes (Signed)
Patient c/o LUQ pain radiating to left back since Sunday past, increasingly worse today. Patient describes the pain as a "stabbing" pain.  Patient c/o N/V, but denies diarrhea and fever.  Patient also c/o malodorous urine, but denies hematuria, dysuria or unusual vaginal discharge.

## 2016-05-21 NOTE — ED Notes (Signed)
RN at bedside starting IV 

## 2016-05-21 NOTE — ED Notes (Addendum)
Pt c/o RUQ abdominal and R flank pain x 2 days.  Sts malodorous urine and clear/white vaginal discharge "that smells really, really bad."  Pt reports similar symptoms previously and was diagnosed w/ "a swollen liver."

## 2016-05-21 NOTE — Discharge Instructions (Signed)
Abdominal Pain, Adult As you have had recurrent pain, your need to follow up with general surgery. Today there was also noted a mass on your cervix. This was discussed with you. It is very important that you follow-up with her gynecologist to make sure that this is not a developing cancer on the cervix. Many things can cause abdominal pain. Usually, abdominal pain is not caused by a disease and will improve without treatment. It can often be observed and treated at home. Your health care provider will do a physical exam and possibly order blood tests and X-rays to help determine the seriousness of your pain. However, in many cases, more time must pass before a clear cause of the pain can be found. Before that point, your health care provider may not know if you need more testing or further treatment. HOME CARE INSTRUCTIONS Monitor your abdominal pain for any changes. The following actions may help to alleviate any discomfort you are experiencing:  Only take over-the-counter or prescription medicines as directed by your health care provider.  Do not take laxatives unless directed to do so by your health care provider.  Try a clear liquid diet (broth, tea, or water) as directed by your health care provider. Slowly move to a bland diet as tolerated. SEEK MEDICAL CARE IF:  You have unexplained abdominal pain.  You have abdominal pain associated with nausea or diarrhea.  You have pain when you urinate or have a bowel movement.  You experience abdominal pain that wakes you in the night.  You have abdominal pain that is worsened or improved by eating food.  You have abdominal pain that is worsened with eating fatty foods.  You have a fever. SEEK IMMEDIATE MEDICAL CARE IF:  Your pain does not go away within 2 hours.  You keep throwing up (vomiting).  Your pain is felt only in portions of the abdomen, such as the right side or the left lower portion of the abdomen.  You pass bloody or black  tarry stools. MAKE SURE YOU:  Understand these instructions.  Will watch your condition.  Will get help right away if you are not doing well or get worse.   This information is not intended to replace advice given to you by your health care provider. Make sure you discuss any questions you have with your health care provider.  Urinary Tract Infection Urinary tract infections (UTIs) can develop anywhere along your urinary tract. Your urinary tract is your body's drainage system for removing wastes and extra water. Your urinary tract includes two kidneys, two ureters, a bladder, and a urethra. Your kidneys are a pair of bean-shaped organs. Each kidney is about the size of your fist. They are located below your ribs, one on each side of your spine. CAUSES Infections are caused by microbes, which are microscopic organisms, including fungi, viruses, and bacteria. These organisms are so small that they can only be seen through a microscope. Bacteria are the microbes that most commonly cause UTIs. SYMPTOMS  Symptoms of UTIs may vary by age and gender of the patient and by the location of the infection. Symptoms in young women typically include a frequent and intense urge to urinate and a painful, burning feeling in the bladder or urethra during urination. Older women and men are more likely to be tired, shaky, and weak and have muscle aches and abdominal pain. A fever may mean the infection is in your kidneys. Other symptoms of a kidney infection include pain in  your back or sides below the ribs, nausea, and vomiting. DIAGNOSIS To diagnose a UTI, your caregiver will ask you about your symptoms. Your caregiver will also ask you to provide a urine sample. The urine sample will be tested for bacteria and white blood cells. White blood cells are made by your body to help fight infection. TREATMENT  Typically, UTIs can be treated with medication. Because most UTIs are caused by a bacterial infection, they  usually can be treated with the use of antibiotics. The choice of antibiotic and length of treatment depend on your symptoms and the type of bacteria causing your infection. HOME CARE INSTRUCTIONS  If you were prescribed antibiotics, take them exactly as your caregiver instructs you. Finish the medication even if you feel better after you have only taken some of the medication.  Drink enough water and fluids to keep your urine clear or pale yellow.  Avoid caffeine, tea, and carbonated beverages. They tend to irritate your bladder.  Empty your bladder often. Avoid holding urine for long periods of time.  Empty your bladder before and after sexual intercourse.  After a bowel movement, women should cleanse from front to back. Use each tissue only once. SEEK MEDICAL CARE IF:   You have back pain.  You develop a fever.  Your symptoms do not begin to resolve within 3 days. SEEK IMMEDIATE MEDICAL CARE IF:   You have severe back pain or lower abdominal pain.  You develop chills.  You have nausea or vomiting.  You have continued burning or discomfort with urination. MAKE SURE YOU:   Understand these instructions.  Will watch your condition.  Will get help right away if you are not doing well or get worse.   This information is not intended to replace advice given to you by your health care provider. Make sure you discuss any questions you have with your health care provider.   Document Released: 08/21/2005 Document Revised: 08/02/2015 Document Reviewed: 12/20/2011 Elsevier Interactive Patient Education [employer]2016 [employer] Inc.

## 2016-05-21 NOTE — ED Provider Notes (Signed)
CSN: [id number]     Arrival date & time 05/21/16  1731 History   First MD Initiated Contact with Patient 05/21/16 1804     Chief Complaint  Patient presents with  . Abdominal Pain  . Back Pain     (Consider location/radiation/quality/duration/timing/severity/associated sxs/prior Treatment) HPI The patient has been having right upper quadrant pain and back pain for several days. She reports her urine has had a bad smell and she has had some frequency. She also reports a history of recurrent abdominal pain. She reports she at one point was told she had a swollen liver. Patient is already had a cholecystectomy. She has had urinary tract infection the past. She denies history of kidney stones. She has not had any fever, vomiting or diarrhea. Patient reports she has had similar pain intermittently and it just seems to go away and then come back at some time. She reports she is always had some kind problems with her abdomen. She reports chronic pain with intercourse and lower abdominal discomfort. Patient poor she had a LEEP procedure once. She reports that she had a Pap smear done approximately 1-2 years ago. She reports she has been having some vaginal discharge but does not noted to be significantly atypical. Past Medical History  Diagnosis Date  . Anemia   . Chlamydia   . Headache   . Depression   . Anxiety    Past Surgical History  Procedure Laterality Date  . Cesarean section    . Appendectomy    . Tonsillectomy    . Cesarean section      x4  . Tubal ligation    . Cholecystectomy N/A 12/24/2014    Procedure: LAPAROSCOPIC CHOLECYSTECTOMY WITH INTRAOPERATIVE CHOLANGIOGRAM;  Surgeon: Glenna FellowsBenjamin Hoxworth, MD;  Location: [facility];  Service: General;  Laterality: N/A;  . Adenoidectomy    . Lymphadenectomy     Family History  Problem Relation Age of Onset  . Diabetes Mother   . Hypertension Mother   . CAD Mother    Social History  Substance Use Topics  . Smoking status: Former Smoker --  0.50 packs/day    Types: Cigarettes    Quit date: 11/25/2014  . Smokeless tobacco: Never Used  . Alcohol Use: No     Comment: former   OB History    Gravida Para Term Preterm AB TAB SAB Ectopic Multiple Living   5 5 3 2      3      Review of Systems  10 Systems reviewed and are negative for acute change except as noted in the HPI.   Allergies  Chantix; Oxycodone; Amoxapine; Augmentin; and Penicillins  Home Medications   Prior to Admission medications   Medication Sig Start Date End Date Taking? Authorizing Provider  albuterol (PROVENTIL HFA;VENTOLIN HFA) 108 (90 BASE) MCG/ACT inhaler Inhale 2 puffs into the lungs every 4 (four) hours as needed for wheezing or shortness of breath. Patient taking differently: Inhale 2 puffs into the lungs every 6 (six) hours as needed for wheezing or shortness of breath.  07/25/14  Yes Shon Batonourtney F Horton, MD  cetirizine (ZYRTEC) 10 MG tablet Take 10 mg by mouth daily.  03/24/15  Yes Historical Provider, MD  EPINEPHrine (EPIPEN 2-PAK) 0.3 mg/0.3 mL IJ SOAJ injection Inject 1 Dose as directed as needed (for allergic reaction).  08/19/14  Yes Historical Provider, MD  fluticasone (FLONASE) 50 MCG/ACT nasal spray Place 1 spray into both nostrils daily.   Yes Historical Provider, MD  Fluticasone-Salmeterol (ADVAIR) 250-50  MCG/DOSE AEPB Inhale 1 puff into the lungs 2 (two) times daily.   Yes Historical Provider, MD  ipratropium-albuterol (DUONEB) 0.5-2.5 (3) MG/3ML SOLN Take 3 mLs by nebulization every 2 (two) hours as needed. Patient taking differently: Take 3 mLs by nebulization every 6 (six) hours as needed (for shortness of breath).  10/03/15  Yes Stevi Barrett, PA-C  levocetirizine (XYZAL) 5 MG tablet Take 5 mg by mouth every evening.   Yes Historical Provider, MD  Levonorgestrel-Ethinyl Estradiol (CAMRESE) 0.15-0.03 &0.01 MG tablet Take 1 tablet by mouth daily.   Yes Historical Provider, MD  montelukast (SINGULAIR) 10 MG tablet Take 10 mg by mouth at  bedtime.   Yes Historical Provider, MD  Olopatadine HCl (PATADAY) 0.2 % SOLN Place 2 drops into both eyes as needed (allergies).  08/19/14  Yes Historical Provider, MD  ondansetron (ZOFRAN-ODT) 4 MG disintegrating tablet Take 4 mg by mouth every 8 (eight) hours as needed for nausea or vomiting.   Yes Historical Provider, MD  rizatriptan (MAXALT-MLT) 5 MG disintegrating tablet Take 1 tablet (5 mg total) by mouth as needed. May repeat in 2 hours if needed Patient taking differently: Take 5 mg by mouth as needed for migraine. May repeat in 2 hours if needed 09/19/15  Yes Levert Feinstein, MD  rOPINIRole (REQUIP) 1 MG tablet Take 1 mg by mouth at bedtime as needed (for restless legs).    Yes Historical Provider, MD  topiramate (TOPAMAX) 100 MG tablet 1/2 tab po bid xone week then one po bid Patient taking differently: Take 100 mg by mouth daily.  09/19/15  Yes Levert Feinstein, MD  traZODone (DESYREL) 100 MG tablet Take 100 mg by mouth at bedtime.  04/04/15  Yes Historical Provider, MD  cefdinir (OMNICEF) 300 MG capsule Take 1 capsule (300 mg total) by mouth 2 (two) times daily. Take for 10 days Patient not taking: Reported on 05/21/2016 03/29/16   Samantha Tripp Dowless, PA-C  ciprofloxacin (CIPRO) 500 MG tablet Take 1 tablet (500 mg total) by mouth 2 (two) times daily. Patient not taking: Reported on 05/21/2016 12/16/15   Dione Booze, MD  HYDROcodone-acetaminophen (NORCO/VICODIN) 5-325 MG tablet Take 1-2 tablets by mouth every 4 (four) hours as needed for moderate pain or severe pain. 05/21/16   Arby Barrette, MD  metroNIDAZOLE (FLAGYL) 500 MG tablet Take 1 tablet (500 mg total) by mouth 3 (three) times daily. Patient not taking: Reported on 05/21/2016 12/16/15   Dione Booze, MD  omeprazole (PRILOSEC) 20 MG capsule Take 1 capsule (20 mg total) by mouth daily. 05/21/16   Arby Barrette, MD  ondansetron (ZOFRAN ODT) 4 MG disintegrating tablet Take 1 tablet (4 mg total) by mouth every 4 (four) hours as needed for nausea or  vomiting. 05/21/16   Arby Barrette, MD  ondansetron (ZOFRAN) 4 MG tablet Take 1 tablet (4 mg total) by mouth every 6 (six) hours. Patient not taking: Reported on 05/21/2016 12/16/15   Dione Booze, MD  sulfamethoxazole-trimethoprim (BACTRIM DS,SEPTRA DS) 800-160 MG tablet Take 1 tablet by mouth 2 (two) times daily. 05/21/16 05/28/16  Arby Barrette, MD   BP 117/74 mmHg  Pulse 78  Temp(Src) 98.8 F (37.1 C) (Oral)  Resp 16  Ht 5\' 10"  (1.778 m)  Wt 265 lb (120.203 kg)  BMI 38.02 kg/m2  SpO2 95% Physical Exam  Constitutional: She is oriented to person, place, and time. She appears well-developed and well-nourished.  Patient is moderately obese. She appears to be in pain. She is alert and nontoxic. No restaurant  stress.  HENT:  Head: Normocephalic and atraumatic.  Mouth/Throat: Oropharynx is clear and moist.  Eyes: EOM are normal. Pupils are equal, round, and reactive to light. No scleral icterus.  Neck: Neck supple.  Cardiovascular: Normal rate, regular rhythm, normal heart sounds and intact distal pulses.   Pulmonary/Chest: Effort normal and breath sounds normal.  Abdominal: Soft. Bowel sounds are normal. She exhibits no distension. There is tenderness.  Patient endorses right upper quadrant tenderness. There is no palpable mass. Moderate epigastric tenderness. Positive right flank tenderness. Lower abdomen is soft and nontender.   Genitourinary: Vagina normal.  External genitalia normal. Back examination moderate whitish yellow discharge in the vaginal vault. Cervix has a approximately 2 cm fleshy mass in the os. No bleeding.  Musculoskeletal: Normal range of motion. She exhibits no edema or tenderness.  Neurological: She is alert and oriented to person, place, and time. She has normal strength. Coordination normal. GCS eye subscore is 4. GCS verbal subscore is 5. GCS motor subscore is 6.  Skin: Skin is warm, dry and intact.  Psychiatric: She has a normal mood and affect.    ED Course   Procedures (including critical care time) Labs Review Labs Reviewed  WET PREP, GENITAL - Abnormal; Notable for the following:    WBC, Wet Prep HPF POC MANY (*)    All other components within normal limits  URINALYSIS, ROUTINE W REFLEX MICROSCOPIC (NOT AT [facility]) - Abnormal; Notable for the following:    Color, Urine AMBER (*)    APPearance CLOUDY (*)    Nitrite POSITIVE (*)    Leukocytes, UA SMALL (*)    All other components within normal limits  URINE MICROSCOPIC-ADD ON - Abnormal; Notable for the following:    Squamous Epithelial / LPF 6-30 (*)    Bacteria, UA MANY (*)    All other components within normal limits  COMPREHENSIVE METABOLIC PANEL - Abnormal; Notable for the following:    Calcium 8.7 (*)    All other components within normal limits  CBC WITH DIFFERENTIAL/PLATELET - Abnormal; Notable for the following:    RBC 5.89 (*)    Hemoglobin 11.2 (*)    HCT 35.7 (*)    MCV 60.6 (*)    MCH 19.0 (*)    All other components within normal limits  URINE CULTURE  LIPASE, BLOOD  PREGNANCY, URINE  RPR  HIV ANTIBODY (ROUTINE TESTING)  GC/CHLAMYDIA PROBE AMP ([place]) NOT AT [facility]    Imaging Review Ct Abdomen Pelvis W Contrast  05/21/2016  CLINICAL DATA:  35 year old female with right-sided abdominal pain EXAM: CT ABDOMEN AND PELVIS WITH CONTRAST TECHNIQUE: Multidetector CT imaging of the abdomen and pelvis was performed using the standard protocol following bolus administration of intravenous contrast. CONTRAST:  100mL ISOVUE-300 IOPAMIDOL (ISOVUE-300) INJECTION 61% COMPARISON:  Abdominal CT dated 12/16/2015 FINDINGS: The visualized lung bases are clear. No intra-abdominal free air or free fluid. Cholecystectomy. Mild biliary ductal dilatation, likely post cholecystectomy. No retained stone identified in the CBD. The pancreas, spleen, adrenal glands, left kidney, left ureter, and urinary bladder appear unremarkable. Find Set there is again stable minimal fullness of the right renal  collecting system, likely chronic. Correlation with urinalysis recommended to exclude UTI. The right ureter appears unremarkable. The uterus is anteverted and grossly unremarkable. The visualized ovaries are also grossly unremarkable. There is a 1 cm left ovarian dominant follicle or cyst. Evaluation of the bowel is limited in the absence of oral contrast. There may be minimal compression of the third  portion of the duodenum as it passes between the aorta and the SMA. The aorto mesenteric angle is slightly reduced measuring approximately 17 degrees. Although there is no evidence of obstruction and no dilatation of the duodenum proximal to this portion identified, SMA syndrome is not entirely excluded in this patient with longstanding history of abdominal pain. Correlation with history and clinical exam recommended. Upper GI study may provide additional evaluation if clinically indicated. There is no evidence of bowel obstruction or active inflammation. Appendectomy. The abdominal aorta and IVC appear unremarkable. No portal venous gas identified. There is no adenopathy. There is focal narrowing of the left renal vein as it passes between the aorta and the SMA. There is no significant dilatation of the left renal vein proximal to this point. Correlation with urinalysis recommended to evaluate for hematuria and possible nutcracker syndrome. Renal venography with evaluation for pressure gradient in the left renal vein may provide better evaluation. There is a small fat containing umbilical hernia. The abdominal wall soft appear unremarkable. The osseous structures are intact. IMPRESSION: No definite acute intra-abdominal or pelvic pathology identified. No evidence of bowel obstruction or active inflammation. Apparent decreased aortomesenteric angulation with focal compression of the third portion of the duodenum between the SMA and aorta. Correlation with history and clinical exam recommended to evaluate for SMA  syndrome. There is also focal narrowing of the left renal vein. Correlation with urinalysis recommended to evaluate for hematuria and possible nutcracker syndrome. Stable minimal fullness of the right renal collecting system. Electronically Signed   By: Elgie Collard M.D.   On: 05/21/2016 22:11   I have personally reviewed and evaluated these images and lab results as part of my medical decision-making.   EKG Interpretation None      MDM   Final diagnoses:  Right upper quadrant pain  UTI (lower urinary tract infection)  Mass of cervix   At this time, symptoms are most consistent with a pyelonephritis. Patient is nontoxic and otherwise well. I feel she is safe to initiate outpatient treatment. Urinalysis is positive. She does have percussion flank tenderness. CT suggests no definite acute pathology. 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 There is no blood in the urine. Patient does not have left-sided pain. At this time I do feel patient is safe to follow-up regarding possible CT anomalies however findings are consistent with pyelonephritis as etiology. Patient is also counseled on the finding of cervical mass. She is aware of the eminent necessity for follow-up to make sure that she has not developed cervical cancer. She has had prior LEEP procedure and a Pap smear within approximately one year. No evidence of PID.    Arby Barrette, MD 05/21/16 2235

## 2016-05-22 LAB — HIV ANTIBODY (ROUTINE TESTING W REFLEX): HIV SCREEN 4TH GENERATION: NONREACTIVE

## 2016-05-22 LAB — RPR: RPR: NONREACTIVE

## 2016-05-22 LAB — GC/CHLAMYDIA PROBE AMP (~~LOC~~) NOT AT ARMC
Chlamydia: NEGATIVE
Neisseria Gonorrhea: POSITIVE — AB

## 2016-05-23 ENCOUNTER — Telehealth (HOSPITAL_BASED_OUTPATIENT_CLINIC_OR_DEPARTMENT_OTHER): Payer: Self-pay | Admitting: Emergency Medicine

## 2016-05-23 NOTE — Telephone Encounter (Signed)
Chart handoff to EDP for treatment plan for +GC 

## 2016-05-24 ENCOUNTER — Telehealth (HOSPITAL_BASED_OUTPATIENT_CLINIC_OR_DEPARTMENT_OTHER): Payer: Self-pay

## 2016-05-24 LAB — URINE CULTURE

## 2016-05-25 ENCOUNTER — Telehealth (HOSPITAL_BASED_OUTPATIENT_CLINIC_OR_DEPARTMENT_OTHER): Payer: Self-pay

## 2016-05-25 NOTE — Telephone Encounter (Signed)
Post ED Visit - Positive Culture Follow-up  Culture report reviewed by antimicrobial stewardship pharmacist:  []  Enzo BiNathan Batchelder, Pharm.D. []  Celedonio MiyamotoJeremy Frens, Pharm.D., BCPS []  Garvin FilaMike Maccia, Pharm.D. []  Georgina PillionElizabeth Martin, Pharm.D., BCPS []  [place] CityMinh Pham, [address]Ph[address]D., BCPS, AAHIVP []  Estella HuskMichelle Turner, Pharm.D., BCPS, AAHIVP [x]  Tennis Mustassie Stewart, Pharm.D. []  Rob Oswaldo DoneVincent, [address]  Positive urine culture Treated with Sulfamethoxazole-Trimethoprim, organism sensitive to the same and no further patient follow-up is required at this time.  Jerry CarasCullom, Nikolaos Maddocks Burnett 05/25/2016, 2:27 PM

## 2016-05-31 ENCOUNTER — Telehealth (HOSPITAL_BASED_OUTPATIENT_CLINIC_OR_DEPARTMENT_OTHER): Payer: Self-pay | Admitting: *Deleted

## 2016-05-31 NOTE — Telephone Encounter (Signed)
Spoke with patient, verified ID, informed of labs, Cefixime 400mg  PO x 1 dose and Azithromycin 1gram PO x 1 [facility] [place] [phone]

## 2016-07-08 ENCOUNTER — Encounter: Payer: Self-pay | Admitting: Allergy and Immunology

## 2016-07-08 ENCOUNTER — Ambulatory Visit (INDEPENDENT_AMBULATORY_CARE_PROVIDER_SITE_OTHER): Payer: Medicaid Other | Admitting: Allergy and Immunology

## 2016-07-08 VITALS — BP 124/84 | HR 84 | Temp 98.9°F | Resp 16 | Ht 68.0 in | Wt 269.2 lb

## 2016-07-08 DIAGNOSIS — J3089 Other allergic rhinitis: Secondary | ICD-10-CM

## 2016-07-08 DIAGNOSIS — L299 Pruritus, unspecified: Secondary | ICD-10-CM | POA: Diagnosis not present

## 2016-07-08 DIAGNOSIS — H1013 Acute atopic conjunctivitis, bilateral: Secondary | ICD-10-CM

## 2016-07-08 DIAGNOSIS — H1010 Acute atopic conjunctivitis, unspecified eye: Secondary | ICD-10-CM | POA: Insufficient documentation

## 2016-07-08 DIAGNOSIS — J4540 Moderate persistent asthma, uncomplicated: Secondary | ICD-10-CM

## 2016-07-08 MED ORDER — MONTELUKAST SODIUM 10 MG PO TABS: 10.0000 mg | ORAL_TABLET | Freq: Every day | ORAL | 5 refills | Status: DC

## 2016-07-08 MED ORDER — ALBUTEROL SULFATE HFA 108 (90 BASE) MCG/ACT IN AERS: 2.0000 | INHALATION_SPRAY | RESPIRATORY_TRACT | 3 refills | Status: DC | PRN

## 2016-07-08 MED ORDER — MOMETASONE FURO-FORMOTEROL FUM 200-5 MCG/ACT IN AERO: 2.0000 | INHALATION_SPRAY | Freq: Two times a day (BID) | RESPIRATORY_TRACT | 5 refills | Status: DC

## 2016-07-08 MED ORDER — AZELASTINE HCL 0.15 % NA SOLN: 2.0000 | Freq: Two times a day (BID) | NASAL | 5 refills | Status: DC

## 2016-07-08 MED ORDER — LEVOCETIRIZINE DIHYDROCHLORIDE 5 MG PO TABS
5.0000 mg | ORAL_TABLET | Freq: Every evening | ORAL | 5 refills | Status: DC
Start: 2016-07-08 — End: 2018-05-13

## 2016-07-08 MED ORDER — OLOPATADINE HCL 0.7 % OP SOLN: 1.0000 [drp] | OPHTHALMIC | 5 refills | Status: DC

## 2016-07-08 NOTE — Progress Notes (Signed)
New Patient Note  RE: Crystal Myers MRN: [id number] DOB: 1980-11-28 Date of Office Visit: 07/08/2016  Referring provider: Everrett CoombeMatthews, Cody, DO Primary care provider: Everrett CoombeMatthews, Cody, DO  Chief Complaint: Allergic Rhinitis ; Asthma; and Pruritus   History of present illness: Crystal Myers is a 35 y.o. female presenting today for consultation of allergic rhinosinusitis and asthma.  She has been evaluated in the past for these problems at [facility] but is transferring her care to our offices.  She experiences frequent nasal congestion, rhinorrhea, sneezing, postnasal drainage, ear fullness, as well as frontal and maxillary sinus pressure.  She reports that recently she has been experiencing sinus pressure almost daily. No significant seasonal symptom variation has been noted nor have specific environmental triggers been identified.  She notes that when she had been on immunotherapy in the past and notes that her nasal/sinus symptoms began to return after having been off of immunotherapy for more than year.  She does not recall how many years she was on immunotherapy and admits that she was not consistent with the injections.  Crystal Myers is interested in the possibility of restarting aeroallergen immunotherapy to reduce symptoms and decrease medication requirement.  She has asthma which is currently treated with Advair 250/50 g, one inhalation twice a day, and montelukast 10 mg daily at bedtime.  She reports that despite compliance with these medications she experiences asthma symptoms 4 or more times per week on average and experiences nocturnal awakenings due to lower respiratory symptoms several times per month.  Crystal Myers also complains of occasional generalized pruritus.  No specific medication, food, or environmental triggers have been identified and she denies concomitant urticaria, cardiopulmonary symptoms, or GI symptoms.   Assessment and plan: Perennial and seasonal allergic  rhinitis  Aeroallergen avoidance measures have been discussed and provided in written form.   A prescription has been provided for levocetirizine, 5 mg daily as needed.  A prescription has been provided for azelastine nasal spray, 1-2 sprays per nostril 2 times daily as needed. Proper nasal spray technique has been discussed and demonstrated.   If needed, add fluticasone nasal spray, 2 sprays per nostril daily as needed.  I have also recommended nasal saline spray (i.e., Simply Saline) or nasal saline lavage (i.e., NeilMed) as needed prior to medicated nasal sprays.  For thick postnasal drainage/nasal congestion/sinus pressure start guaifenesin 1200 mg (plus/minus pseudoephedrine 120 mg) twice daily as needed with adequate hydration. Pseudoephedrine is only to be used for short-term relief of nasal/sinus congestion. Long-term use is discouraged due to potential side effects.   The risks and benefits of aeroallergen immunotherapy have been discussed. The patient is motivated to initiate immunotherapy to reduce symptoms and decrease medication requirement. Informed consent has been signed and allergen vaccine orders have been submitted. Medications will be decreased or discontinued as symptom relief from immunotherapy becomes evident.  Allergic conjunctivitis  Treatment plan as outlined above for allergic rhinitis.  A prescription has been provided for Pazeo, one drop per eye daily as needed.  Moderate persistent asthma Currently suboptimally controlled.  A prescription has been provided for [facility] (mometasone/formoterol) 200/5 g, 2 inhalations twice a day.  To maximize pulmonary deposition, a spacer has been provided along with instructions for its proper administration with an HFA inhaler.  For now, continue montelukast 10 mg daily bedtime and albuterol every 4-6 hours as needed.  Subjective and objective measures of pulmonary function will be followed and the treatment plan will be  adjusted accordingly.  Generalized pruritus  Unclear etiology.  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  For now, she will follow appropriate allergen avoidance measures and take levocetirizine 5 mg daily as needed.  If symptoms persist or progress, we will evaluate further.   Meds ordered this encounter  Medications  . montelukast (SINGULAIR) 10 MG tablet    Sig: Take 1 tablet (10 mg total) by mouth at bedtime.    Dispense:  30 tablet    Refill:  5  . mometasone-formoterol (DULERA) 200-5 MCG/ACT AERO    Sig: Inhale 2 puffs into the lungs 2 (two) times daily.    Dispense:  1 Inhaler    Refill:  5  . levocetirizine (XYZAL) 5 MG tablet    Sig: Take 1 tablet (5 mg total) by mouth every evening.    Dispense:  30 tablet    Refill:  5  . Azelastine HCl 0.15 % SOLN    Sig: Place 2 sprays into both nostrils 2 (two) times daily.    Dispense:  30 mL    Refill:  5  . albuterol (PROAIR HFA) 108 (90 Base) MCG/ACT inhaler    Sig: Inhale 2 puffs into the lungs every 4 (four) hours as needed for wheezing or shortness of breath.    Dispense:  1 Inhaler    Refill:  3  . Olopatadine HCl (PAZEO) 0.7 % SOLN    Sig: Place 1 drop into both eyes 1 day or 1 dose.    Dispense:  1 Bottle    Refill:  5    Diagnositics: Spirometry:  Normal with an FEV1 of 99% predicted.  Please see scanned spirometry results for details. Environmental skin testing: Positive to grass pollens and dust mite antigen. Food allergen skin testing:  Negative despite a positive histamine control.    Physical examination: Blood pressure 124/84, pulse 84, temperature 98.9 F (37.2 C), resp. rate 16, height 5\' 8"  (1.727 m), weight 269 lb 3.2 oz (122.1 kg).  General: Alert, interactive, in no acute distress. HEENT: TMs pearly gray, turbinates edematous without discharge, post-pharynx moderately erythematous. Neck: Supple without lymphadenopathy. Lungs: Clear to auscultation without wheezing, rhonchi or  rales. CV: Normal S1, S2 without murmurs. Abdomen: Nondistended, nontender. Skin: Warm and dry, without lesions or rashes. Extremities:  No clubbing, cyanosis or edema. Neuro:   Grossly intact.  Review of systems:  Review of systems negative except as noted in HPI / PMHx or noted below: Review of Systems  Constitutional: Negative.   HENT: Negative.   Eyes: Negative.   Respiratory: Negative.   Cardiovascular: Negative.   Gastrointestinal: Negative.   Genitourinary: Negative.   Musculoskeletal: Negative.   Skin: Negative.   Neurological: Negative.   Endo/Heme/Allergies: Negative.   Psychiatric/Behavioral: Negative.     Past medical history:  Past Medical History:  Diagnosis Date  . Anemia   . Anxiety   . Chlamydia   . Depression   . Headache     Past surgical history:  Past Surgical History:  Procedure Laterality Date  . ADENOIDECTOMY    . APPENDECTOMY    . CESAREAN SECTION    . CESAREAN SECTION     x4  . CHOLECYSTECTOMY N/A 12/24/2014   Procedure: LAPAROSCOPIC CHOLECYSTECTOMY WITH INTRAOPERATIVE CHOLANGIOGRAM;  Surgeon: Glenna FellowsBenjamin Hoxworth, MD;  Location: [facility];  Service: General;  Laterality: N/A;  . LYMPHADENECTOMY    . TONSILLECTOMY    . TUBAL LIGATION      Family history: Family History  Problem Relation Age of Onset  . Diabetes Mother   .  Hypertension Mother   . CAD Mother   . Allergic rhinitis Mother   . Asthma Sister     Social history: Social History   Social History  . Marital status: Single    Spouse name: N/A  . Number of children: 4  . Years of education: HS   Occupational History  . N/A    Social History Main Topics  . Smoking status: Former Smoker    Packs/day: 0.50    Types: Cigarettes    Quit date: 11/25/2014  . Smokeless tobacco: Never Used  . Alcohol use No     Comment: former  . Drug use: No  . Sexual activity: Yes    Birth control/ protection: None, Surgical   Other Topics Concern  . Not on file   Social History  Narrative   Drinks about 1 soda a week    Environmental History: The patient lives in a 36 year old house with hardwood floors throughout and central air/heat.  She has no pets in the home.  She is a former cigarette smoker having quit on 11/25/2014.    Medication List       Accurate as of 07/08/16  6:00 PM. Always use your most recent med list.          albuterol 108 (90 Base) MCG/ACT inhaler Commonly known as:  PROAIR HFA Inhale 2 puffs into the lungs every 4 (four) hours as needed for wheezing or shortness of breath.   Azelastine HCl 0.15 % Soln Place 2 sprays into both nostrils 2 (two) times daily.   CAMRESE 0.15-0.03 &0.01 MG tablet Generic drug:  Levonorgestrel-Ethinyl Estradiol Take 1 tablet by mouth daily.   cetirizine 10 MG tablet Commonly known as:  ZYRTEC Take 10 mg by mouth daily.   EPIPEN 2-PAK 0.3 mg/0.3 mL Soaj injection Generic drug:  EPINEPHrine Inject 1 Dose as directed as needed (for allergic reaction).   fluticasone 50 MCG/ACT nasal spray Commonly known as:  FLONASE Place 1 spray into both nostrils daily.   Fluticasone-Salmeterol 250-50 MCG/DOSE Aepb Commonly known as:  ADVAIR Inhale 1 puff into the lungs 2 (two) times daily.   HYDROcodone-acetaminophen 5-325 MG tablet Commonly known as:  NORCO/VICODIN Take 1-2 tablets by mouth every 4 (four) hours as needed for moderate pain or severe pain.   ipratropium-albuterol 0.5-2.5 (3) MG/3ML Soln Commonly known as:  DUONEB Take 3 mLs by nebulization every 2 (two) hours as needed.   levocetirizine 5 MG tablet Commonly known as:  XYZAL Take 1 tablet (5 mg total) by mouth every evening.   mometasone-formoterol 200-5 MCG/ACT Aero Commonly known as:  DULERA Inhale 2 puffs into the lungs 2 (two) times daily.   montelukast 10 MG tablet Commonly known as:  SINGULAIR Take 1 tablet (10 mg total) by mouth at bedtime.   Olopatadine HCl 0.7 % Soln Commonly known as:  PAZEO Place 1 drop into both eyes 1 day  or 1 dose.   omeprazole 20 MG capsule Commonly known as:  PRILOSEC Take 1 capsule (20 mg total) by mouth daily.   rizatriptan 5 MG disintegrating tablet Commonly known as:  MAXALT-MLT Take 1 tablet (5 mg total) by mouth as needed. May repeat in 2 hours if needed   rOPINIRole 1 MG tablet Commonly known as:  REQUIP Take 1 mg by mouth at bedtime as needed (for restless legs).   topiramate 100 MG tablet Commonly known as:  TOPAMAX 1/2 tab po bid xone week then one po bid   traZODone 100  MG tablet Commonly known as:  DESYREL Take 100 mg by mouth at bedtime.       Known medication allergies: Allergies  Allergen Reactions  . Chantix [Varenicline] Other (See Comments)    Nightmare   . Oxycodone Itching  . Amoxapine Itching  . Augmentin [Amoxicillin-Pot Clavulanate] Itching    Has patient had a PCN reaction causing immediate rash, facial/tongue/throat swelling, SOB or lightheadedness with hypotension: Yes Has patient had a PCN reaction causing severe rash involving mucus membranes or skin necrosis: Yes Has patient had a PCN reaction that required hospitalization No Has patient had a PCN reaction occurring within the last 10 years: No If all of the above answers are "NO", then may proceed with Cephalosporin use.   Marland Kitchen Penicillins Rash    Has patient had a PCN reaction causing immediate rash, facial/tongue/throat swelling, SOB or lightheadedness with hypotension: No Has patient had a PCN reaction causing severe rash involving mucus membranes or skin necrosis: No Has patient had a PCN reaction that required hospitalization No Has patient had a PCN reaction occurring within the last 10 years: No If all of the above answers are "NO", then may proceed with Cephalosporin use.     I appreciate the opportunity to take part in Crystal Myers's care. Please do not hesitate to contact me with questions.  Sincerely,   R. Jorene Guest, MD

## 2016-07-08 NOTE — Patient Instructions (Addendum)
Perennial and seasonal allergic rhinitis  Aeroallergen avoidance measures have been discussed and provided in written form.   A prescription has been provided for levocetirizine, 5 mg daily as needed.  A prescription has been provided for azelastine nasal spray, 1-2 sprays per nostril 2 times daily as needed. Proper nasal spray technique has been discussed and demonstrated.   If needed, add fluticasone nasal spray, 2 sprays per nostril daily as needed.  I have also recommended nasal saline spray (i.e., Simply Saline) or nasal saline lavage (i.e., NeilMed) as needed prior to medicated nasal sprays.  For thick postnasal drainage/nasal congestion/sinus pressure start guaifenesin 1200 mg (plus/minus pseudoephedrine 120 mg) twice daily as needed with adequate hydration. Pseudoephedrine is only to be used for short-term relief of nasal/sinus congestion. Long-term use is discouraged due to potential side effects.   The risks and benefits of aeroallergen immunotherapy have been discussed. The patient is motivated to initiate immunotherapy to reduce symptoms and decrease medication requirement. Informed consent has been signed and allergen vaccine orders have been submitted. Medications will be decreased or discontinued as symptom relief from immunotherapy becomes evident.  Allergic conjunctivitis  Treatment plan as outlined above for allergic rhinitis.  A prescription has been provided for Pazeo, one drop per eye daily as needed.  Moderate persistent asthma Currently suboptimally controlled.  A prescription has been provided for [facility] (mometasone/formoterol) 200/5 g, 2 inhalations twice a day.  To maximize pulmonary deposition, a spacer has been provided along with instructions for its proper administration with an HFA inhaler.  For now, continue montelukast 10 mg daily bedtime and albuterol every 4-6 hours as needed.  Subjective and objective measures of pulmonary function will be followed  and the treatment plan will be adjusted accordingly.  Generalized pruritus Unclear etiology.  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  For now, she will follow appropriate allergen avoidance measures and take levocetirizine 5 mg daily as needed.  If symptoms persist or progress, we will evaluate further.   Return in about 2 months (around 09/07/2016), or if symptoms worsen or fail to improve.  Control of House Dust Mite Allergen  House dust mites play a major role in allergic asthma and rhinitis.  They occur in environments with high humidity wherever human skin, the food for dust mites is found. High levels have been detected in dust obtained from mattresses, pillows, carpets, upholstered furniture, bed covers, clothes and soft toys.  The principal allergen of the house dust mite is found in its feces.  A gram of dust may contain 1,000 mites and 250,000 fecal particles.  Mite antigen is easily measured in the air during house cleaning activities.    1. Encase mattresses, including the box spring, and pillow, in an air tight cover.  Seal the zipper end of the encased mattresses with wide adhesive tape. 2. Wash the bedding in water of 130 degrees Farenheit weekly.  Avoid cotton comforters/quilts and flannel bedding: the most ideal bed covering is the dacron comforter. 3. Remove all upholstered furniture from the bedroom. 4. Remove carpets, carpet padding, rugs, and non-washable window drapes from the bedroom.  Wash drapes weekly or use plastic window coverings. 5. Remove all non-washable stuffed toys from the bedroom.  Wash stuffed toys weekly. 6. Have the room cleaned frequently with a vacuum cleaner and a damp dust-mop.  The patient should not be in a room which is being cleaned and should wait 1 hour after cleaning before going into the room. 7. Close and seal all heating outlets  in the bedroom.  Otherwise, the room will become filled with dust-laden air.  An electric heater can be  used to heat the room. 8. Reduce indoor humidity to less than 50%.  Do not use a humidifier.  Reducing Pollen Exposure  The American Academy of Allergy, Asthma and Immunology suggests the following steps to reduce your exposure to pollen during allergy seasons.    1. Do not hang sheets or clothing out to dry; pollen may collect on these items. 2. Do not mow lawns or spend time around freshly cut grass; mowing stirs up pollen. 3. Keep windows closed at night.  Keep car windows closed while driving. 4. Minimize morning activities outdoors, a time when pollen counts are usually at their highest. 5. Stay indoors as much as possible when pollen counts or humidity is high and on windy days when pollen tends to remain in the air longer. 6. Use air conditioning when possible.  Many air conditioners have filters that trap the pollen spores. 7. Use a HEPA room air filter to remove pollen form the indoor air you breathe.

## 2016-07-08 NOTE — Assessment & Plan Note (Addendum)
   Aeroallergen avoidance measures have been discussed and provided in written form.   A prescription has been provided for levocetirizine, 5 mg daily as needed.  A prescription has been provided for azelastine nasal spray, 1-2 sprays per nostril 2 times daily as needed. Proper nasal spray technique has been discussed and demonstrated.   If needed, add fluticasone nasal spray, 2 sprays per nostril daily as needed.  I have also recommended nasal saline spray (i.e., Simply Saline) or nasal saline lavage (i.e., NeilMed) as needed prior to medicated nasal sprays.  For thick postnasal drainage/nasal congestion/sinus pressure start guaifenesin 1200 mg (plus/minus pseudoephedrine 120 mg) twice daily as needed with adequate hydration. Pseudoephedrine is only to be used for short-term relief of nasal/sinus congestion. Long-term use is discouraged due to potential side effects.   The risks and benefits of aeroallergen immunotherapy have been discussed. The patient is motivated to initiate immunotherapy to reduce symptoms and decrease medication requirement. Informed consent has been signed and allergen vaccine orders have been submitted. Medications will be decreased or discontinued as symptom relief from immunotherapy becomes evident.

## 2016-07-08 NOTE — Assessment & Plan Note (Signed)
Unclear etiology.  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  For now, she will follow appropriate allergen avoidance measures and take levocetirizine 5 mg daily as needed.  If symptoms persist or progress, we will evaluate further.

## 2016-07-08 NOTE — Assessment & Plan Note (Signed)
Currently suboptimally controlled.  A [facility] (m[facility]asone/[facility]oterol) 200/5 g, 2 inhalations twice a day.  To maximize pulmonary deposition, a spacer has been provided along with instructions for its proper administration with an HFA inhaler.  For now, continue montelukast 10 mg daily bedtime and albuterol every 4-6 hours as needed.  Subjective and objective measures of pulmonary function will be followed and the treatment plan will be adjusted accordingly.

## 2016-07-08 NOTE — Assessment & Plan Note (Signed)
   Treatment plan as outlined above for allergic rhinitis.  A prescription has been provided for Pazeo, one drop per eye daily as needed. 

## 2016-07-09 ENCOUNTER — Other Ambulatory Visit: Payer: Self-pay | Admitting: *Deleted

## 2016-07-09 DIAGNOSIS — J3089 Other allergic rhinitis: Secondary | ICD-10-CM | POA: Diagnosis not present

## 2016-07-09 MED ORDER — AZELASTINE HCL 0.1 % NA SOLN: 2.0000 | Freq: Two times a day (BID) | NASAL | 5 refills | Status: DC

## 2016-07-25 ENCOUNTER — Ambulatory Visit: Payer: Medicaid Other

## 2016-07-30 ENCOUNTER — Ambulatory Visit (INDEPENDENT_AMBULATORY_CARE_PROVIDER_SITE_OTHER): Payer: Medicaid Other

## 2016-07-30 ENCOUNTER — Ambulatory Visit: Payer: Self-pay

## 2016-07-30 DIAGNOSIS — J309 Allergic rhinitis, unspecified: Secondary | ICD-10-CM

## 2016-07-30 NOTE — Progress Notes (Signed)
Immunotherapy   Patient Details  Name: Crystal Myers MRN: [id number] Date of Birth: 11-Feb-1981  07/30/2016  Crystal Myers  Started injections on vial blue Following schedule: B  Frequency:1-2 TIMES PER WEEK Epi-Pen:YES patient instructions given.   Crystal Myers 07/30/2016, 2:23 PM

## 2016-08-06 ENCOUNTER — Ambulatory Visit (INDEPENDENT_AMBULATORY_CARE_PROVIDER_SITE_OTHER): Payer: Medicaid Other | Admitting: *Deleted

## 2016-08-06 DIAGNOSIS — J309 Allergic rhinitis, unspecified: Secondary | ICD-10-CM | POA: Diagnosis not present

## 2016-08-13 ENCOUNTER — Ambulatory Visit (INDEPENDENT_AMBULATORY_CARE_PROVIDER_SITE_OTHER): Payer: Medicaid Other

## 2016-08-13 DIAGNOSIS — J309 Allergic rhinitis, unspecified: Secondary | ICD-10-CM | POA: Diagnosis not present

## 2016-08-26 ENCOUNTER — Ambulatory Visit (INDEPENDENT_AMBULATORY_CARE_PROVIDER_SITE_OTHER): Payer: Medicaid Other

## 2016-08-26 DIAGNOSIS — J309 Allergic rhinitis, unspecified: Secondary | ICD-10-CM

## 2016-09-09 ENCOUNTER — Ambulatory Visit: Payer: Medicaid Other | Admitting: Allergy and Immunology

## 2016-09-12 ENCOUNTER — Ambulatory Visit (INDEPENDENT_AMBULATORY_CARE_PROVIDER_SITE_OTHER): Payer: Medicaid Other | Admitting: *Deleted

## 2016-09-12 DIAGNOSIS — J309 Allergic rhinitis, unspecified: Secondary | ICD-10-CM | POA: Diagnosis not present

## 2016-09-23 ENCOUNTER — Ambulatory Visit: Payer: Medicaid Other | Admitting: Allergy and Immunology

## 2016-10-01 ENCOUNTER — Ambulatory Visit (INDEPENDENT_AMBULATORY_CARE_PROVIDER_SITE_OTHER): Payer: Medicaid Other

## 2016-10-01 DIAGNOSIS — J309 Allergic rhinitis, unspecified: Secondary | ICD-10-CM

## 2016-10-08 ENCOUNTER — Ambulatory Visit: Payer: Medicaid Other | Admitting: Allergy and Immunology

## 2016-10-22 ENCOUNTER — Encounter (HOSPITAL_COMMUNITY): Payer: Self-pay | Admitting: Emergency Medicine

## 2016-10-22 DIAGNOSIS — J069 Acute upper respiratory infection, unspecified: Secondary | ICD-10-CM | POA: Diagnosis not present

## 2016-10-22 DIAGNOSIS — Z87891 Personal history of nicotine dependence: Secondary | ICD-10-CM | POA: Diagnosis not present

## 2016-10-22 DIAGNOSIS — J4521 Mild intermittent asthma with (acute) exacerbation: Secondary | ICD-10-CM | POA: Diagnosis not present

## 2016-10-22 DIAGNOSIS — J45909 Unspecified asthma, uncomplicated: Secondary | ICD-10-CM | POA: Diagnosis present

## 2016-10-22 MED ORDER — ALBUTEROL SULFATE (2.5 MG/3ML) 0.083% IN NEBU
5.0000 mg | INHALATION_SOLUTION | Freq: Once | RESPIRATORY_TRACT | Status: AC
  Administered 2016-10-23: 5 mg via RESPIRATORY_TRACT
  Filled 2016-10-22: qty 6

## 2016-10-22 NOTE — ED Triage Notes (Signed)
Pt c/o SOB related to several asthma attacks she has had since Saturday; pt as used inhalers, Delera, and nebulizers; pt tachypnic upon assessment

## 2016-10-23 ENCOUNTER — Emergency Department (HOSPITAL_COMMUNITY)
Admission: EM | Admit: 2016-10-23 | Discharge: 2016-10-23 | Disposition: A | Discharge status: Home / Self Care | Payer: Medicaid Other | Attending: Emergency Medicine | Admitting: Emergency Medicine

## 2016-10-23 ENCOUNTER — Emergency Department (HOSPITAL_COMMUNITY): Payer: Medicaid Other

## 2016-10-23 DIAGNOSIS — J4521 Mild intermittent asthma with (acute) exacerbation: Secondary | ICD-10-CM

## 2016-10-23 DIAGNOSIS — J069 Acute upper respiratory infection, unspecified: Secondary | ICD-10-CM

## 2016-10-23 LAB — D-DIMER, QUANTITATIVE: D-Dimer, Quant: 0.27 ug/mL-FEU (ref 0.00–0.50)

## 2016-10-23 LAB — I-STAT CHEM 8, ED
BUN: 9 mg/dL (ref 6–20)
CHLORIDE: 116 mmol/L — AB (ref 101–111)
CREATININE: 0.7 mg/dL (ref 0.44–1.00)
Calcium, Ion: 1.1 mmol/L — ABNORMAL LOW (ref 1.15–1.40)
GLUCOSE: 119 mg/dL — AB (ref 65–99)
HEMATOCRIT: 36 % (ref 36.0–46.0)
Hemoglobin: 12.2 g/dL (ref 12.0–15.0)
POTASSIUM: 3.7 mmol/L (ref 3.5–5.1)
Sodium: 139 mmol/L (ref 135–145)
TCO2: 23 mmol/L (ref 0–100)

## 2016-10-23 MED ORDER — BENZONATATE 100 MG PO CAPS
100.0000 mg | ORAL_CAPSULE | Freq: Once | ORAL | Status: AC
  Administered 2016-10-23: 100 mg via ORAL
  Filled 2016-10-23: qty 1

## 2016-10-23 MED ORDER — PREDNISONE 20 MG PO TABS
60.0000 mg | ORAL_TABLET | Freq: Once | ORAL | Status: AC
  Administered 2016-10-23: 60 mg via ORAL
  Filled 2016-10-23: qty 3

## 2016-10-23 NOTE — ED Provider Notes (Signed)
[facility]-EMERGENCY DEPT Provider Note   CSN: [id number] Arrival date & time: 10/22/16  2341     History   Chief Complaint Chief Complaint  Patient presents with  . Asthma    HPI Crystal Myers is a 35 y.o. female.  35 year old female with a history of asthma, anxiety, anemia, and depression presents to the emergency department for shortness of breath. Patient states that symptoms have been worsening over the last 3 days. She reports using her inhaler at home with only temporary improvement. She feels that she has continued to have tightness in her chest, though wheezing does subside with treatments. Patient usually has further improvement after being placed on a steroid. She has no history of PE, but does report a surgery 1.5 months ago. She is on birth control. Symptoms associated with nasal congestion and postnasal drip as well as a mild, dry cough. No associated fevers or sore throat. No vomiting or diarrhea.   The history is provided by the patient. No language interpreter was used.  Asthma     Past Medical History:  Diagnosis Date  . Anemia   . Anxiety   . Chlamydia   . Depression   . Headache     Patient Active Problem List   Diagnosis Date Noted  . Perennial and seasonal allergic rhinitis 07/08/2016  . Allergic conjunctivitis 07/08/2016  . Moderate persistent asthma 07/08/2016  . Generalized pruritus 07/08/2016  . Depression 12/24/2014  . Acute calculous cholecystitis s/p lap chole 12/24/2014 12/23/2014  . Migraine 02/11/2013    Past Surgical History:  Procedure Laterality Date  . ADENOIDECTOMY    . APPENDECTOMY    . CESAREAN SECTION    . CESAREAN SECTION     x4  . CHOLECYSTECTOMY N/A 12/24/2014   Procedure: LAPAROSCOPIC CHOLECYSTECTOMY WITH INTRAOPERATIVE CHOLANGIOGRAM;  Surgeon: Glenna FellowsBenjamin Hoxworth, MD;  Location: [facility];  Service: General;  Laterality: N/A;  . DILATION AND CURETTAGE OF UTERUS    . LYMPHADENECTOMY    . TONSILLECTOMY    . TUBAL LIGATION       OB History    Gravida Para Term Preterm AB Living   5 5 3 2   3    SAB TAB Ectopic Multiple Live Births           4       Home Medications    Prior to Admission medications   Medication Sig Start Date End Date Taking? Authorizing Provider  albuterol (PROAIR HFA) 108 (90 Base) MCG/ACT inhaler Inhale 2 puffs into the lungs every 4 (four) hours as needed for wheezing or shortness of breath. 07/08/16   Cristal Fordalph Carter Bobbitt, MD  azelastine (ASTELIN) 0.1 % nasal spray Place 2 sprays into both nostrils 2 (two) times daily. Use in each nostril as directed 07/09/16   Cristal Fordalph Carter Bobbitt, MD  cetirizine (ZYRTEC) 10 MG tablet Take 10 mg by mouth daily.  03/24/15   Historical Provider, MD  EPINEPHrine (EPIPEN 2-PAK) 0.3 mg/0.3 mL IJ SOAJ injection Inject 1 Dose as directed as needed (for allergic reaction).  08/19/14   Historical Provider, MD  fluticasone (FLONASE) 50 MCG/ACT nasal spray Place 1 spray into both nostrils daily.    Historical Provider, MD  Fluticasone-Salmeterol (ADVAIR) 250-50 MCG/DOSE AEPB Inhale 1 puff into the lungs 2 (two) times daily.    Historical Provider, MD  HYDROcodone-acetaminophen (NORCO/VICODIN) 5-325 MG tablet Take 1-2 tablets by mouth every 4 (four) hours as needed for moderate pain or severe pain. 05/21/16   Arby BarretteMarcy Pfeiffer, MD  ipratropium-albuterol (DUONEB) 0.5-2.5 (3) MG/3ML SOLN Take 3 mLs by nebulization every 2 (two) hours as needed. Patient taking differently: Take 3 mLs by nebulization every 6 (six) hours as needed (for shortness of breath).  10/03/15   Stevi Barrett, PA-C  levocetirizine (XYZAL) 5 MG tablet Take 1 tablet (5 mg total) by mouth every evening. 07/08/16   Cristal Ford, MD  Levonorgestrel-Ethinyl Estradiol (CAMRESE) 0.15-0.03 &0.01 MG tablet Take 1 tablet by mouth daily.    Historical Provider, MD  mometasone-formoterol (DULERA) 200-5 MCG/ACT AERO Inhale 2 puffs into the lungs 2 (two) times daily. 07/08/16   Cristal Ford, MD  montelukast  (SINGULAIR) 10 MG tablet Take 1 tablet (10 mg total) by mouth at bedtime. 07/08/16   Cristal Ford, MD  Olopatadine HCl (PAZEO) 0.7 % SOLN Place 1 drop into both eyes 1 day or 1 dose. 07/08/16   Cristal Ford, MD  omeprazole (PRILOSEC) 20 MG capsule Take 1 capsule (20 mg total) by mouth daily. 05/21/16   Arby Barrette, MD  rizatriptan (MAXALT-MLT) 5 MG disintegrating tablet Take 1 tablet (5 mg total) by mouth as needed. May repeat in 2 hours if needed Patient taking differently: Take 5 mg by mouth as needed for migraine. May repeat in 2 hours if needed 09/19/15   Levert Feinstein, MD  rOPINIRole (REQUIP) 1 MG tablet Take 1 mg by mouth at bedtime as needed (for restless legs).     Historical Provider, MD  topiramate (TOPAMAX) 100 MG tablet 1/2 tab po bid xone week then one po bid Patient taking differently: Take 100 mg by mouth daily.  09/19/15   Levert Feinstein, MD  traZODone (DESYREL) 100 MG tablet Take 100 mg by mouth at bedtime.  04/04/15   Historical Provider, MD    Family History Family History  Problem Relation Age of Onset  . Diabetes Mother   . Hypertension Mother   . CAD Mother   . Allergic rhinitis Mother   . Asthma Sister     Social History Social History  Substance Use Topics  . Smoking status: Former Smoker    Packs/day: 0.50    Types: Cigarettes    Quit date: 11/25/2014  . Smokeless tobacco: Never Used  . Alcohol use No     Comment: former     Allergies   Chantix [varenicline]; Oxycodone; Amoxapine; Augmentin [amoxicillin-pot clavulanate]; and Penicillins   Review of Systems Review of Systems Ten systems reviewed and are negative for acute change, except as noted in the HPI.    Physical Exam Updated Vital Signs BP 104/71 (BP Location: Left Arm)   Pulse 97   Temp 97.9 F (36.6 C) (Oral)   Resp 18   Ht 5\' 9"  (1.753 m)   Wt 124.3 kg   SpO2 96%   BMI 40.46 kg/m   Physical Exam  Constitutional: She is oriented to person, place, and time. She appears  well-developed and well-nourished. No distress.  Nontoxic appearing and in no distress  HENT:  Head: Normocephalic and atraumatic.  Mouth/Throat: Oropharynx is clear and moist.  Audible nasal congestion. Oropharynx clear.  Eyes: Conjunctivae and EOM are normal. No scleral icterus.  Neck: Normal range of motion.  Cardiovascular: Normal rate, regular rhythm and intact distal pulses.   Pulmonary/Chest: Effort normal. No stridor. No respiratory distress. She has no wheezes. She has no rales.  Mild dyspnea without tachypnea. Lungs clear to auscultation. No stridor. Chest expansion symmetric. No accessory muscle use.  Musculoskeletal: Normal range of motion.  Neurological: She is alert and oriented to person, place, and time. She exhibits normal muscle tone. Coordination normal.  GCS 15. Patient ambulatory with steady gait.  Skin: Skin is warm and dry. No rash noted. She is not diaphoretic. No erythema. No pallor.  Psychiatric: She has a normal mood and affect. Her behavior is normal.  Nursing note and vitals reviewed.    ED Treatments / Results  Labs (all labs ordered are listed, but only abnormal results are displayed) Labs Reviewed  I-STAT CHEM 8, ED - Abnormal; Notable for the following:       Result Value   Chloride 116 (*)    Glucose, Bld 119 (*)    Calcium, Ion 1.10 (*)    All other components within normal limits  D-DIMER, QUANTITATIVE (NOT AT [facility])    EKG  EKG Interpretation  Date/Time:  Wednesday October 23 2016 01:26:09 EST Ventricular Rate:  113 PR Interval:    QRS Duration: 97 QT Interval:  354 QTC Calculation: 486 R Axis:   45 Text Interpretation:  Sinus tachycardia Probable inferior infarct, old No significant change since last tracing Confirmed by Erroll Lunani, Adeleke Ayokunle [phone]([id number]) on 10/23/2016 4:40:17 AM       Radiology Dg Chest 2 View  Result Date: 10/23/2016 CLINICAL DATA:  Shortness of breath and asthma EXAM: CHEST  2 VIEW COMPARISON:  Chest radiograph  10/03/2015 FINDINGS: There is unchanged bronchial thickening, but no focal airspace consolidation or pulmonary edema. Normal cardiomediastinal contours. No pleural effusion or pneumothorax. IMPRESSION: 1. No focal airspace disease. 2. Unchanged chronic bronchial thickening, compatible with reported asthma. Electronically Signed   By: Deatra RobinsonKevin  Herman M.D.   On: 10/23/2016 00:59    Procedures Procedures (including critical care time)  Medications Ordered in ED Medications  albuterol (PROVENTIL) (2.5 MG/3ML) 0.083% nebulizer solution 5 mg (5 mg Nebulization Given 10/23/16 0009)  predniSONE (DELTASONE) tablet 60 mg (60 mg Oral Given 10/23/16 0342)  benzonatate (TESSALON) capsule 100 mg (100 mg Oral Given 10/23/16 0342)     Initial Impression / Assessment and Plan / ED Course  I have reviewed the triage vital signs and the nursing notes.  Pertinent labs & imaging results that were available during my care of the patient were reviewed by me and considered in my medical decision making (see chart for details).  Clinical Course     Pt CXR negative for acute infiltrate. Patients symptoms are consistent with URI, likely viral etiology. Patient reports that she is breathing at baseline after PO prednisone and albuterol nebulizer. She ambulates in the ED without hypoxia. Dimer negative; doubt PE. Pt will be discharged with symptomatic treatment. She verbalizes understanding and is agreeable with plan. Patient discharged in stable condition with no unaddressed concerns.   Final Clinical Impressions(s) / ED Diagnoses   Final diagnoses:  Mild intermittent asthma with acute exacerbation  Viral upper respiratory tract infection    New Prescriptions Discharge Medication List as of 10/23/2016  4:54 AM       Antony MaduraKelly Tiondra Fang, PA-C 10/23/16 0559    Tomasita CrumbleAdeleke Oni, MD 10/23/16 [phone]

## 2016-10-23 NOTE — Discharge Instructions (Signed)
Take prednisone as prescribed until finished. Take Tessalon for cough, as needed. Continue using your albuterol every 4-6 hours for cough, wheezing, or SOB.

## 2017-01-13 ENCOUNTER — Emergency Department (HOSPITAL_COMMUNITY)
Admission: EM | Admit: 2017-01-13 | Discharge: 2017-01-13 | Disposition: A | Discharge status: Home / Self Care | Payer: Medicaid Other | Attending: Emergency Medicine | Admitting: Emergency Medicine

## 2017-01-13 ENCOUNTER — Encounter (HOSPITAL_COMMUNITY): Payer: Self-pay | Admitting: Emergency Medicine

## 2017-01-13 DIAGNOSIS — G43009 Migraine without aura, not intractable, without status migrainosus: Secondary | ICD-10-CM | POA: Diagnosis not present

## 2017-01-13 DIAGNOSIS — Z87891 Personal history of nicotine dependence: Secondary | ICD-10-CM | POA: Insufficient documentation

## 2017-01-13 DIAGNOSIS — J45909 Unspecified asthma, uncomplicated: Secondary | ICD-10-CM | POA: Insufficient documentation

## 2017-01-13 DIAGNOSIS — R51 Headache: Secondary | ICD-10-CM | POA: Diagnosis present

## 2017-01-13 MED ORDER — METOCLOPRAMIDE HCL 5 MG/ML IJ SOLN
10.0000 mg | Freq: Once | INTRAMUSCULAR | Status: AC
  Administered 2017-01-13: 10 mg via INTRAVENOUS
  Filled 2017-01-13: qty 2

## 2017-01-13 MED ORDER — KETOROLAC TROMETHAMINE 30 MG/ML IJ SOLN
15.0000 mg | Freq: Once | INTRAMUSCULAR | Status: AC
  Administered 2017-01-13: 15 mg via INTRAVENOUS
  Filled 2017-01-13: qty 1

## 2017-01-13 MED ORDER — DEXAMETHASONE SODIUM PHOSPHATE 10 MG/ML IJ SOLN
10.0000 mg | Freq: Once | INTRAMUSCULAR | Status: AC
  Administered 2017-01-13: 10 mg via INTRAVENOUS
  Filled 2017-01-13: qty 1

## 2017-01-13 MED ORDER — DIPHENHYDRAMINE HCL 50 MG/ML IJ SOLN
12.5000 mg | Freq: Once | INTRAMUSCULAR | Status: AC
  Administered 2017-01-13: 12.5 mg via INTRAVENOUS
  Filled 2017-01-13: qty 1

## 2017-01-13 NOTE — ED Triage Notes (Signed)
C/o sinus pressure and "a little" nasal congestion x 2 weeks.  Denies cough.  States she has been taking antibiotic for sinus infection x 1 week and isn't any better.  Unsure of name of medication/ doesn't have it with her.

## 2017-01-13 NOTE — ED Provider Notes (Signed)
[facility]-EMERGENCY DEPT Provider Note   CSN: [id number] Arrival date & time: 01/13/17  0037     History   Chief Complaint Chief Complaint  Patient presents with  . Facial Pain    HPI Crystal Myers is a 36 y.o. female.  Patient presents with complaint of facial pain in the area of bilateral frontal and maxillary sinuses. She states the symptoms started 2 weeks ago and were associated with nasal congestion. She went to see her PCP and started treatment for a sinus infection but she presents tonight with persistent symptoms without any improvement. No fever. She states that today she experienced nausea with vomiting x 1. She has a history of complicated migraine that involved headache similar to current headache with left UE numbness. She endorses photophobia.    The history is provided by the patient. No language interpreter was used.    Past Medical History:  Diagnosis Date  . Anemia   . Anxiety   . Chlamydia   . Depression   . Headache     Patient Active Problem List   Diagnosis Date Noted  . Perennial and seasonal allergic rhinitis 07/08/2016  . Allergic conjunctivitis 07/08/2016  . Moderate persistent asthma 07/08/2016  . Generalized pruritus 07/08/2016  . Depression 12/24/2014  . Acute calculous cholecystitis s/p lap chole 12/24/2014 12/23/2014  . Migraine 02/11/2013    Past Surgical History:  Procedure Laterality Date  . ADENOIDECTOMY    . APPENDECTOMY    . CESAREAN SECTION    . CESAREAN SECTION     x4  . CHOLECYSTECTOMY N/A 12/24/2014   Procedure: LAPAROSCOPIC CHOLECYSTECTOMY WITH INTRAOPERATIVE CHOLANGIOGRAM;  Surgeon: Glenna Fellows, MD;  Location: [facility];  Service: General;  Laterality: N/A;  . DILATION AND CURETTAGE OF UTERUS    . LYMPHADENECTOMY    . TONSILLECTOMY    . TUBAL LIGATION      OB History    Gravida Para Term Preterm AB Living   5 5 3 2   3    SAB TAB Ectopic Multiple Live Births           4       Home Medications    Prior to  Admission medications   Medication Sig Start Date End Date Taking? Authorizing Provider  albuterol (PROAIR HFA) 108 (90 Base) MCG/ACT inhaler Inhale 2 puffs into the lungs every 4 (four) hours as needed for wheezing or shortness of breath. 07/08/16   Cristal Ford, MD  azelastine (ASTELIN) 0.1 % nasal spray Place 2 sprays into both nostrils 2 (two) times daily. Use in each nostril as directed 07/09/16   Cristal Ford, MD  cetirizine (ZYRTEC) 10 MG tablet Take 10 mg by mouth daily.  03/24/15   Historical Provider, MD  EPINEPHrine (EPIPEN 2-PAK) 0.3 mg/0.3 mL IJ SOAJ injection Inject 1 Dose as directed as needed (for allergic reaction).  08/19/14   Historical Provider, MD  fluticasone (FLONASE) 50 MCG/ACT nasal spray Place 1 spray into both nostrils daily.    Historical Provider, MD  Fluticasone-Salmeterol (ADVAIR) 250-50 MCG/DOSE AEPB Inhale 1 puff into the lungs 2 (two) times daily.    Historical Provider, MD  HYDROcodone-acetaminophen (NORCO/VICODIN) 5-325 MG tablet Take 1-2 tablets by mouth every 4 (four) hours as needed for moderate pain or severe pain. 05/21/16   Arby Barrette, MD  ipratropium-albuterol (DUONEB) 0.5-2.5 (3) MG/3ML SOLN Take 3 mLs by nebulization every 2 (two) hours as needed. Patient taking differently: Take 3 mLs by nebulization every 6 (six) hours  as needed (for shortness of breath).  10/03/15   Stevi Barrett, PA-C  levocetirizine (XYZAL) 5 MG tablet Take 1 tablet (5 mg total) by mouth every evening. 07/08/16   Cristal Fordalph Carter Bobbitt, MD  Levonorgestrel-Ethinyl Estradiol (CAMRESE) 0.15-0.03 &0.01 MG tablet Take 1 tablet by mouth daily.    Historical Provider, MD  mometasone-formoterol (DULERA) 200-5 MCG/ACT AERO Inhale 2 puffs into the lungs 2 (two) times daily. 07/08/16   Cristal Fordalph Carter Bobbitt, MD  montelukast (SINGULAIR) 10 MG tablet Take 1 tablet (10 mg total) by mouth at bedtime. 07/08/16   Cristal Fordalph Carter Bobbitt, MD  Olopatadine HCl (PAZEO) 0.7 % SOLN Place 1 drop into  both eyes 1 day or 1 dose. 07/08/16   Cristal Fordalph Carter Bobbitt, MD  omeprazole (PRILOSEC) 20 MG capsule Take 1 capsule (20 mg total) by mouth daily. 05/21/16   Arby BarretteMarcy Pfeiffer, MD  rizatriptan (MAXALT-MLT) 5 MG disintegrating tablet Take 1 tablet (5 mg total) by mouth as needed. May repeat in 2 hours if needed Patient taking differently: Take 5 mg by mouth as needed for migraine. May repeat in 2 hours if needed 09/19/15   Levert FeinsteinYijun Yan, MD  rOPINIRole (REQUIP) 1 MG tablet Take 1 mg by mouth at bedtime as needed (for restless legs).     Historical Provider, MD  topiramate (TOPAMAX) 100 MG tablet 1/2 tab po bid xone week then one po bid Patient taking differently: Take 100 mg by mouth daily.  09/19/15   Levert FeinsteinYijun Yan, MD  traZODone (DESYREL) 100 MG tablet Take 100 mg by mouth at bedtime.  04/04/15   Historical Provider, MD    Family History Family History  Problem Relation Age of Onset  . Diabetes Mother   . Hypertension Mother   . CAD Mother   . Allergic rhinitis Mother   . Asthma Sister     Social History Social History  Substance Use Topics  . Smoking status: Former Smoker    Packs/day: 0.50    Types: Cigarettes    Quit date: 11/25/2014  . Smokeless tobacco: Never Used  . Alcohol use No     Comment: former     Allergies   Chantix [varenicline]; Oxycodone; Amoxapine; Augmentin [amoxicillin-pot clavulanate]; and Penicillins   Review of Systems Review of Systems  Constitutional: Negative for chills and fever.  HENT: Positive for sinus pain. Negative for congestion and trouble swallowing.   Eyes: Positive for photophobia.  Respiratory: Negative.  Negative for cough.   Cardiovascular: Negative.  Negative for chest pain.  Gastrointestinal: Positive for nausea and vomiting. Negative for abdominal pain.  Musculoskeletal: Negative.   Skin: Negative.   Neurological: Positive for headaches. Negative for syncope, facial asymmetry, speech difficulty and weakness.     Physical Exam Updated Vital  Signs BP 152/68 (BP Location: Right Arm)   Pulse 104   Temp 98.1 F (36.7 C) (Oral)   Resp 20   SpO2 100%   Physical Exam  Constitutional: She is oriented to person, place, and time. She appears well-developed and well-nourished.  HENT:  Head: Normocephalic.  Eyes: Pupils are equal, round, and reactive to light.  Neck: Normal range of motion. Neck supple.  Cardiovascular: Normal rate and regular rhythm.   Pulmonary/Chest: Effort normal and breath sounds normal.  Abdominal: Soft. Bowel sounds are normal. There is no tenderness. There is no rebound and no guarding.  Musculoskeletal: Normal range of motion.  Neurological: She is alert and oriented to person, place, and time. She has normal strength and normal reflexes. No  sensory deficit. She displays a negative Romberg sign. Coordination normal.  CN's 3-12 intact. No deficits of coordination, facial asymmetry or speech difficulties.   Skin: Skin is warm and dry. No rash noted.  Psychiatric: She has a normal mood and affect.     ED Treatments / Results  Labs (all labs ordered are listed, but only abnormal results are displayed) Labs Reviewed - No data to display  EKG  EKG Interpretation None       Radiology No results found.  Procedures Procedures (including critical care time)  Medications Ordered in ED Medications  metoCLOPramide (REGLAN) injection 10 mg (not administered)  diphenhydrAMINE (BENADRYL) injection 12.5 mg (not administered)  dexamethasone (DECADRON) injection 10 mg (not administered)     Initial Impression / Assessment and Plan / ED Course  I have reviewed the triage vital signs and the nursing notes.  Pertinent labs & imaging results that were available during my care of the patient were reviewed by me and considered in my medical decision making (see chart for details).     Patient presents with frontal and maxillary facial pain, nausea, photophobia. She is currently being treated with abx for  sinus infection without change. She has a history of migraine. Suspect symptoms to be migrainous as opposed to sinus.   Headache cocktail provided with relief of headache pain. Recommended PCP follow up for further management.  Final Clinical Impressions(s) / ED Diagnoses   Final diagnoses:  None   1. Migraine headache, resolved  New Prescriptions New Prescriptions   No medications on file     Elpidio Anis, PA-C 01/13/17 0409    Tomasita Crumble, MD 01/13/17 [phone]

## 2017-06-08 ENCOUNTER — Encounter (HOSPITAL_COMMUNITY): Payer: Self-pay

## 2017-06-08 ENCOUNTER — Emergency Department (HOSPITAL_COMMUNITY)
Admission: EM | Admit: 2017-06-08 | Discharge: 2017-06-08 | Disposition: A | Discharge status: Home / Self Care | Payer: Medicaid Other | Attending: Emergency Medicine | Admitting: Emergency Medicine

## 2017-06-08 DIAGNOSIS — R51 Headache: Secondary | ICD-10-CM | POA: Diagnosis present

## 2017-06-08 DIAGNOSIS — Z5321 Procedure and treatment not carried out due to patient leaving prior to being seen by health care provider: Secondary | ICD-10-CM | POA: Diagnosis not present

## 2017-06-08 DIAGNOSIS — J029 Acute pharyngitis, unspecified: Secondary | ICD-10-CM | POA: Diagnosis not present

## 2017-06-08 DIAGNOSIS — R0989 Other specified symptoms and signs involving the circulatory and respiratory systems: Secondary | ICD-10-CM

## 2017-06-08 HISTORY — DX: Attention-deficit hyperactivity disorder, unspecified type: F90.9

## 2017-06-08 NOTE — ED Triage Notes (Signed)
Pt states for the past 3-4 days she has been having stuffy head, runny nose, headache, ear nose and throat pain, states last time she had this it was sinusitis. Denies fevers.

## 2017-06-08 NOTE — ED Provider Notes (Signed)
I did not see or evaluate the patient. She left prior to my evaluation   Crystal Myers, Crystal Botsford, MD 06/08/17 [phone]

## 2017-06-08 NOTE — ED Notes (Signed)
Pt refused vitals, stating that she was going to be going home. Pt states that it is late for the children that were with him. Pt told she should be seen shortly and was encouraged to stay to see a provider.

## 2017-09-16 ENCOUNTER — Encounter (HOSPITAL_COMMUNITY): Payer: Self-pay

## 2017-09-16 ENCOUNTER — Emergency Department (HOSPITAL_COMMUNITY)
Admission: EM | Admit: 2017-09-16 | Discharge: 2017-09-16 | Disposition: A | Discharge status: Home / Self Care | Payer: Medicaid Other | Attending: Emergency Medicine | Admitting: Emergency Medicine

## 2017-09-16 DIAGNOSIS — Z5321 Procedure and treatment not carried out due to patient leaving prior to being seen by health care provider: Secondary | ICD-10-CM | POA: Diagnosis not present

## 2017-09-16 DIAGNOSIS — R51 Headache: Secondary | ICD-10-CM | POA: Insufficient documentation

## 2017-09-16 NOTE — ED Triage Notes (Signed)
Patient presented with c/o migraine headache, nausea., vomiting and dizziness for 2 days. Patient rating her pain at 9/10.

## 2017-09-16 NOTE — ED Notes (Signed)
Pt turned in her labels and said she was leaving

## 2017-09-19 ENCOUNTER — Encounter (HOSPITAL_COMMUNITY): Payer: Self-pay | Admitting: *Deleted

## 2017-09-19 ENCOUNTER — Emergency Department (HOSPITAL_COMMUNITY)
Admission: EM | Admit: 2017-09-19 | Discharge: 2017-09-19 | Disposition: A | Discharge status: Home / Self Care | Payer: Medicaid Other | Attending: Emergency Medicine | Admitting: Emergency Medicine

## 2017-09-19 DIAGNOSIS — G43109 Migraine with aura, not intractable, without status migrainosus: Secondary | ICD-10-CM | POA: Insufficient documentation

## 2017-09-19 DIAGNOSIS — Z880 Allergy status to penicillin: Secondary | ICD-10-CM | POA: Diagnosis not present

## 2017-09-19 DIAGNOSIS — R000 Tachycardia, unspecified: Secondary | ICD-10-CM | POA: Diagnosis not present

## 2017-09-19 DIAGNOSIS — Z87891 Personal history of nicotine dependence: Secondary | ICD-10-CM | POA: Insufficient documentation

## 2017-09-19 DIAGNOSIS — Z885 Allergy status to narcotic agent status: Secondary | ICD-10-CM | POA: Diagnosis not present

## 2017-09-19 DIAGNOSIS — Z79899 Other long term (current) drug therapy: Secondary | ICD-10-CM | POA: Insufficient documentation

## 2017-09-19 LAB — CBC WITH DIFFERENTIAL/PLATELET
BASOS ABS: 0.1 10*3/uL (ref 0.0–0.1)
Basophils Relative: 1 %
EOS ABS: 0.4 10*3/uL (ref 0.0–0.7)
Eosinophils Relative: 4 %
HCT: 38.2 % (ref 36.0–46.0)
Hemoglobin: 11.9 g/dL — ABNORMAL LOW (ref 12.0–15.0)
LYMPHS ABS: 2.9 10*3/uL (ref 0.7–4.0)
LYMPHS PCT: 27 %
MCH: 19.1 pg — ABNORMAL LOW (ref 26.0–34.0)
MCHC: 31.2 g/dL (ref 30.0–36.0)
MCV: 61.2 fL — ABNORMAL LOW (ref 78.0–100.0)
MONO ABS: 0.3 10*3/uL (ref 0.1–1.0)
Monocytes Relative: 3 %
NEUTROS ABS: 6.9 10*3/uL (ref 1.7–7.7)
Neutrophils Relative %: 65 %
PLATELETS: 300 10*3/uL (ref 150–400)
RBC: 6.24 MIL/uL — ABNORMAL HIGH (ref 3.87–5.11)
RDW: 15.9 % — AB (ref 11.5–15.5)
WBC: 10.6 10*3/uL — ABNORMAL HIGH (ref 4.0–10.5)

## 2017-09-19 LAB — I-STAT BETA HCG BLOOD, ED (MC, WL, AP ONLY)

## 2017-09-19 LAB — BASIC METABOLIC PANEL
ANION GAP: 6 (ref 5–15)
BUN: 7 mg/dL (ref 6–20)
CALCIUM: 9 mg/dL (ref 8.9–10.3)
CO2: 24 mmol/L (ref 22–32)
Chloride: 108 mmol/L (ref 101–111)
Creatinine, Ser: 0.77 mg/dL (ref 0.44–1.00)
GFR calc Af Amer: >60 mL/min (ref >60)
GLUCOSE: 87 mg/dL (ref 65–99)
Potassium: 4 mmol/L (ref 3.5–5.1)
Sodium: 138 mmol/L (ref 135–145)

## 2017-09-19 MED ORDER — KETOROLAC TROMETHAMINE 30 MG/ML IJ SOLN
30.0000 mg | Freq: Once | INTRAMUSCULAR | Status: AC
  Administered 2017-09-19: 30 mg via INTRAVENOUS
  Filled 2017-09-19: qty 1

## 2017-09-19 MED ORDER — SODIUM CHLORIDE 0.9 % IV BOLUS (SEPSIS)
1000.0000 mL | Freq: Once | INTRAVENOUS | Status: AC
  Administered 2017-09-19: 1000 mL via INTRAVENOUS

## 2017-09-19 MED ORDER — TOPIRAMATE 100 MG PO TABS: ORAL_TABLET | ORAL | 0 refills | Status: DC

## 2017-09-19 MED ORDER — DIPHENHYDRAMINE HCL 50 MG/ML IJ SOLN
25.0000 mg | Freq: Once | INTRAMUSCULAR | Status: AC
  Administered 2017-09-19: 25 mg via INTRAVENOUS
  Filled 2017-09-19: qty 1

## 2017-09-19 MED ORDER — METOCLOPRAMIDE HCL 5 MG/ML IJ SOLN
10.0000 mg | Freq: Once | INTRAMUSCULAR | Status: AC
  Administered 2017-09-19: 10 mg via INTRAVENOUS
  Filled 2017-09-19: qty 2

## 2017-09-19 NOTE — ED Triage Notes (Signed)
Pt states that she has had a headache for 5 days. Pt states that she has nausea and facial :numbness" that has been ongoing since yesterday. . Pt states that she had migraines in the past with the same symptoms.

## 2017-09-19 NOTE — Discharge Instructions (Signed)
Please read and follow all provided instructions.  Your diagnoses today include:  1. Complicated migraine     Tests performed today include: Vital signs. See below for your results today.  Blood work - this was reassuring  Medications:  In the Emergency Department you received: Reglan - antinausea/headache medication Benadryl - antihistamine to counteract potential side effects of reglan Toradol - NSAID medication similar to ibuprofen  I am prescribing you your topamax to prevent your HA from returning  Additional information:  Follow any educational materials contained in this packet.  Your headache appears to be the same as your migraines in the past. It does not appear to be life-threatening or require hospitalization. Follow-up with your doctor is very important to find out what may have caused your headache and whether or not you need any further diagnostic testing or treatment.   Sometimes headaches can appear benign (not harmful), but then more serious symptoms can develop which should prompt an immediate re-evaluation by your doctor or the emergency department.  BE VERY CAREFUL not to take multiple medicines containing Tylenol (also called acetaminophen). Doing so can lead to an overdose which can damage your liver and cause liver failure and possibly death.   Follow-up instructions: Please follow-up with your primary care provider in the next 3 days for further evaluation of your symptoms.   Return instructions:  Please return to the Emergency Department if you experience worsening symptoms. Return if the medications do not resolve your headache, if it recurs, or if you have multiple episodes of vomiting or cannot keep down fluids. Return if you have a change from the usual headache. RETURN IMMEDIATELY IF you: Develop a sudden, severe headache Develop confusion or become poorly responsive or faint Develop a fever above 100.36F or problem breathing Have a change in  speech, vision, swallowing, or understanding Develop new weakness, numbness, tingling, incoordination in your arms or legs Have a seizure Please return if you have any other emergent concerns.  Additional Information:  Your vital signs today were: BP 112/66    Pulse 80    Temp 98.3 F (36.8 C) (Oral)    Resp 20    SpO2 99%  If your blood pressure (BP) was elevated above 135/85 this visit, please have this repeated by your doctor within one month. --------------

## 2017-09-19 NOTE — ED Provider Notes (Signed)
[facility]ENC[facility] Provider Note   CSN: [id number] Arrival date & time: 09/19/17  [id number]     History   Chief Complaint Chief Complaint  Patient presents with  . Migraine    HPI Crystal Myers is a 36 y.o. female with a history of complicated migraines who presents to the emergency department today for headache x 5 days.  Patient states that 5 days ago she had a gradual onset of left-sided throbbing headache with associated photophobia, phonophobia, nausea and vomiting.  Pain is gradually increased from 5/10 to now 9/10.  She denies thunderclap onset or worst headache of life.  She reports she now has some left-sided facial numbness that began this morning.  She denies any paresthesias of the upper or lower extremities, extremity weakness, facial droop, difficulty with speech, aphasia, or loss of vision.  She is taken ibuprofen and Excedrin Migraine for this without any relief.  She has been seen in the past by [facility] for this where she has had a MRI of the brain done and was reassuring. She was on Topamax in the past for this but since has stopped because she has not followed up. Denies fever, syncope, head trauma, stiff neck, neck pain, rash, seizure, jaw claudication. States this is similar to previous headaches in the past. No one in party with similar HA or exposure that would suggest CO poisoning.   HPI  Past Medical History:  Diagnosis Date  . ADHD   . Anemia   . Anxiety   . Chlamydia   . Depression   . Headache     Patient Active Problem List   Diagnosis Date Noted  . Perennial and seasonal allergic rhinitis 07/08/2016  . Allergic conjunctivitis 07/08/2016  . Moderate persistent asthma 07/08/2016  . Generalized pruritus 07/08/2016  . Depression 12/24/2014  . Acute calculous cholecystitis s/p lap chole 12/24/2014 12/23/2014  . Migraine 02/11/2013    Past Surgical History:  Procedure Laterality Date  . ADENOIDECTOMY    .  APPENDECTOMY    . CESAREAN SECTION    . CESAREAN SECTION     x4  . CHOLECYSTECTOMY N/A 12/24/2014   Procedure: LAPAROSCOPIC CHOLECYSTECTOMY WITH INTRAOPERATIVE CHOLANGIOGRAM;  Surgeon: Glenna FellowsBenjamin Hoxworth, MD;  Location: [facility];  Service: General;  Laterality: N/A;  . DILATION AND CURETTAGE OF UTERUS    . LYMPHADENECTOMY    . TONSILLECTOMY    . TUBAL LIGATION      OB History    Gravida Para Term Preterm AB Living   5 5 3 2   3    SAB TAB Ectopic Multiple Live Births           4       Home Medications    Prior to Admission medications   Medication Sig Start Date End Date Taking? Authorizing Provider  albuterol (PROAIR HFA) 108 (90 Base) MCG/ACT inhaler Inhale 2 puffs into the lungs every 4 (four) hours as needed for wheezing or shortness of breath. 07/08/16   Bobbitt, Heywood Ilesalph Carter, MD  azelastine (ASTELIN) 0.1 % nasal spray Place 2 sprays into both nostrils 2 (two) times daily. Use in each nostril as directed 07/09/16   Bobbitt, Heywood Ilesalph Carter, MD  cetirizine (ZYRTEC) 10 MG tablet Take 10 mg by mouth daily.  03/24/15   [provider]  EPINEPHrine (EPIPEN 2-PAK) 0.3 mg/0.3 mL IJ SOAJ injection Inject 1 Dose as directed as needed (for allergic reaction).  08/19/14   [provider]  fluticasone Aleda Grana(FLONASE)  50 MCG/ACT nasal spray Place 1 spray into both nostrils daily.    [provider]  Fluticasone-Salmeterol (ADVAIR) 250-50 MCG/DOSE AEPB Inhale 1 puff into the lungs 2 (two) times daily.    [provider]  HYDROcodone-acetaminophen (NORCO/VICODIN) 5-325 MG tablet Take 1-2 tablets by mouth every 4 (four) hours as needed for moderate pain or severe pain. 05/21/16   Arby Barrette, MD  ipratropium-albuterol (DUONEB) 0.5-2.5 (3) MG/3ML SOLN Take 3 mLs by nebulization every 2 (two) hours as needed. Patient taking differently: Take 3 mLs by nebulization every 6 (six) hours as needed (for shortness of breath).  10/03/15   Barrett, Rolm Gala, PA-C  levocetirizine (XYZAL)  5 MG tablet Take 1 tablet (5 mg total) by mouth every evening. 07/08/16   Bobbitt, Heywood Iles, MD  Levonorgestrel-Ethinyl Estradiol (CAMRESE) 0.15-0.03 &0.01 MG tablet Take 1 tablet by mouth daily.    [provider]  mometasone-formoterol (DULERA) 200-5 MCG/ACT AERO Inhale 2 puffs into the lungs 2 (two) times daily. 07/08/16   Bobbitt, Heywood Iles, MD  montelukast (SINGULAIR) 10 MG tablet Take 1 tablet (10 mg total) by mouth at bedtime. 07/08/16   Bobbitt, Heywood Iles, MD  Olopatadine HCl (PAZEO) 0.7 % SOLN Place 1 drop into both eyes 1 day or 1 dose. 07/08/16   Bobbitt, Heywood Iles, MD  omeprazole (PRILOSEC) 20 MG capsule Take 1 capsule (20 mg total) by mouth daily. 05/21/16   Arby Barrette, MD  rizatriptan (MAXALT-MLT) 5 MG disintegrating tablet Take 1 tablet (5 mg total) by mouth as needed. May repeat in 2 hours if needed Patient taking differently: Take 5 mg by mouth as needed for migraine. May repeat in 2 hours if needed 09/19/15   Levert Feinstein, MD  rOPINIRole (REQUIP) 1 MG tablet Take 1 mg by mouth at bedtime as needed (for restless legs).     [provider]  topiramate (TOPAMAX) 100 MG tablet 1/2 tab po bid xone week then one po bid Patient taking differently: Take 100 mg by mouth daily.  09/19/15   Levert Feinstein, MD  traZODone (DESYREL) 100 MG tablet Take 100 mg by mouth at bedtime.  04/04/15   [provider]    Family History Family History  Problem Relation Age of Onset  . Diabetes Mother   . Hypertension Mother   . CAD Mother   . Allergic rhinitis Mother   . Asthma Sister     Social History Social History  Substance Use Topics  . Smoking status: Former Smoker    Packs/day: 0.50    Types: Cigarettes    Quit date: 11/25/2014  . Smokeless tobacco: Never Used  . Alcohol use No     Comment: former     Allergies   Chantix [varenicline]; Oxycodone; Amoxapine; Augmentin [amoxicillin-pot clavulanate]; and Penicillins   Review of Systems Review of  Systems  All other systems reviewed and are negative.    Physical Exam Updated Vital Signs BP 99/69   Pulse 83   Temp 98.3 F (36.8 C) (Oral)   Resp (!) 23   SpO2 98%   Physical Exam  Constitutional: She appears well-developed and well-nourished.  HENT:  Head: Normocephalic and atraumatic.  Right Ear: External ear normal.  Left Ear: External ear normal.  Nose: Nose normal.  Mouth/Throat: Uvula is midline, oropharynx is clear and moist and mucous membranes are normal. No tonsillar exudate.  No temporal tenderness palpation  Eyes: Pupils are equal, round, and reactive to light. Right eye exhibits no discharge. Left eye  exhibits no discharge. No scleral icterus.  No proptosis  Neck: Trachea normal and phonation normal. Neck supple. No spinous process tenderness present. Carotid bruit is not present. No neck rigidity. Normal range of motion present.  Cardiovascular: Regular rhythm and intact distal pulses.  Tachycardia present.   No murmur heard. Pulses:      Radial pulses are 2+ on the right side, and 2+ on the left side.       Dorsalis pedis pulses are 2+ on the right side, and 2+ on the left side.       Posterior tibial pulses are 2+ on the right side, and 2+ on the left side.  No lower extremity swelling or edema. Calves symmetric in size bilaterally.  Pulmonary/Chest: Effort normal and breath sounds normal. She exhibits no tenderness.  Abdominal: Soft. Bowel sounds are normal. There is no tenderness. There is no rebound and no guarding.  Musculoskeletal: She exhibits no edema.  Lymphadenopathy:    She has no cervical adenopathy.  Neurological: She is alert.  Mental Status:  Alert, oriented, thought content appropriate, able to give a coherent history. Speech fluent without evidence of aphasia. Able to follow 2 step commands without difficulty.  Cranial Nerves:  II:  Peripheral visual fields grossly normal, pupils equal, round, reactive to light III,IV, VI: ptosis not  present, extra-ocular motions intact bilaterally  V,VII: smile symmetric, eyebrows raise symmetric. Reports decreased sensation on left side of facial light touch compared to right.  VIII: hearing grossly normal to voice  X: uvula elevates symmetrically  XI: bilateral shoulder shrug symmetric and strong XII: midline tongue extension without fassiculations Motor:  Normal tone. Equal right upper and lower extremities strength including grip strength and dorsiflexion/plantar flexion Sensory: Sensation intact to light touch in all extremities. Negative Romberg.  Deep Tendon Reflexes: 2+ and symmetric in the biceps and patella Cerebellar: normal finger-to-nose with bilateral upper extremities. Normal heel-to -shin balance bilaterally of the lower extremity. No pronator drift.  Gait: normal gait and balance CV: distal pulses palpable throughout   Skin: Skin is warm and dry. No rash noted. She is not diaphoretic.  Psychiatric: Her mood appears anxious.  Nursing note and vitals reviewed.    ED Treatments / Results  Labs (all labs ordered are listed, but only abnormal results are displayed) Labs Reviewed - No data to display  EKG  EKG Interpretation None       Radiology No results found.  Procedures Procedures (including critical care time)  Medications Ordered in ED Medications - No data to display   Initial Impression / Assessment and Plan / ED Course  I have reviewed the triage vital signs and the nursing notes.  Pertinent labs & imaging results that were available during my care of the patient were reviewed by me and considered in my medical decision making (see chart for details).      36 year old female with history of complicated migraines presenting with same symptoms as prior complicated migraines. Pt HA treated and improved while in ED. Patient left sided numbness improved after treatment of HA. Patient able to walk in the ED. Presentation is like pts typical HA and  non concerning for [facility], ICH, Meningitis, or temporal arteritis. Pt is afebrile with no focal neuro deficits, nuchal rigidity, or change in vision. Pt is to follow up with PCP vs neurology. I will provide the patient with rx for Topamax for prophylaxis. Pt verbalizes understanding and is agreeable with plan to dc. Patient case discussed  with Dr. Effie Shy who is in agreement with plan.  Final Clinical Impressions(s) / ED Diagnoses   Final diagnoses:  Complicated migraine    New Prescriptions Discharge Medication List as of 09/19/2017  2:12 PM       Jacinto Halim, PA-C 09/19/17 1615    Mancel Bale, MD 09/20/17 [phone]

## 2017-09-19 NOTE — ED Notes (Signed)
PA at bedside.

## 2018-04-27 ENCOUNTER — Ambulatory Visit (INDEPENDENT_AMBULATORY_CARE_PROVIDER_SITE_OTHER): Payer: Medicaid Other | Admitting: Obstetrics and Gynecology

## 2018-04-27 ENCOUNTER — Other Ambulatory Visit (HOSPITAL_COMMUNITY)
Admission: RE | Admit: 2018-04-27 | Discharge: 2018-04-27 | Disposition: A | Discharge status: Home / Self Care | Payer: Medicaid Other | Source: Ambulatory Visit | Attending: Obstetrics and Gynecology | Admitting: Obstetrics and Gynecology

## 2018-04-27 ENCOUNTER — Encounter: Payer: Self-pay | Admitting: Obstetrics and Gynecology

## 2018-04-27 VITALS — BP 141/87 | HR 112 | Ht 68.5 in | Wt 277.2 lb

## 2018-04-27 DIAGNOSIS — Z01419 Encounter for gynecological examination (general) (routine) without abnormal findings: Secondary | ICD-10-CM | POA: Diagnosis not present

## 2018-04-27 DIAGNOSIS — N941 Unspecified dyspareunia: Secondary | ICD-10-CM | POA: Diagnosis not present

## 2018-04-27 DIAGNOSIS — N898 Other specified noninflammatory disorders of vagina: Secondary | ICD-10-CM

## 2018-04-27 DIAGNOSIS — R300 Dysuria: Secondary | ICD-10-CM | POA: Diagnosis not present

## 2018-04-27 NOTE — Progress Notes (Signed)
Subjective:     Crystal Myers is a [age] y.o. female [phone] with BMI 41.5 and amenorrhea induced with continuous OCP who is here for a comprehensive physical exam. The patient reports the presence of a yellowish discharge with associated pruritis and a history of recurrent yeast infections. She also reports some dyspareunia and dysuria. Patient experiences vaginal pain with insertion. She expressed some vulva sensitivity as well. She has tried KY jelly without improvement in her symptoms. Patient is sexually active using BTL. She is taking continuous COC due to a history of DUB. She states that she stopped taking her pills for a 3 month period and remained amenorrheic during that time. She admits some hot flashes and night sweats. She reports that her mother and aunt entered menopause in their early 6930's.  Past Medical History:  Diagnosis Date  . ADHD   . Anemia   . Anxiety   . Chlamydia   . Depression   . Headache    Past Surgical History:  Procedure Laterality Date  . ADENOIDECTOMY    . APPENDECTOMY    . CESAREAN SECTION    . CESAREAN SECTION     x4  . CHOLECYSTECTOMY N/A 12/24/2014   Procedure: LAPAROSCOPIC CHOLECYSTECTOMY WITH INTRAOPERATIVE CHOLANGIOGRAM;  Surgeon: Glenna FellowsBenjamin Hoxworth, MD;  Location: [facility];  Service: General;  Laterality: N/A;  . DILATION AND CURETTAGE OF UTERUS    . LYMPHADENECTOMY    . TONSILLECTOMY    . TUBAL LIGATION     Family History  Problem Relation Age of Onset  . Diabetes Mother   . Hypertension Mother   . CAD Mother   . Allergic rhinitis Mother   . Asthma Sister     Social History   Socioeconomic History  . Marital status: Single    Spouse name: Not on file  . Number of children: 4  . Years of education: HS  . Highest education level: Not on file  Occupational History  . Occupation: N/A  Social Needs  . Financial resource strain: Not on file  . Food insecurity:    Worry: Not on file    Inability: Not on file  . Transportation needs:   Medical: Not on file    Non-medical: Not on file  Tobacco Use  . Smoking status: Former Smoker    Packs/day: 0.50    Types: Cigarettes    Last attempt to quit: 11/25/2014    Years since quitting: 3.4  . Smokeless tobacco: Never Used  Substance and Sexual Activity  . Alcohol use: No    Alcohol/week: 0.0 oz    Comment: former  . Drug use: No  . Sexual activity: Yes    Birth control/protection: None, Surgical  Lifestyle  . Physical activity:    Days per week: Not on file    Minutes per session: Not on file  . Stress: Not on file  Relationships  . Social connections:    Talks on phone: Not on file    Gets together: Not on file    Attends religious service: Not on file    Active member of club or organization: Not on file    Attends meetings of clubs or organizations: Not on file    Relationship status: Not on file  . Intimate partner violence:    Fear of current or ex partner: Not on file    Emotionally abused: Not on file    Physically abused: Not on file    Forced sexual activity: Not on file  Other Topics Concern  . Not on file  Social History Narrative   Drinks about 1 soda a week    Health Maintenance  Topic Date Due  . PAP SMEAR  08/15/2001  . INFLUENZA VACCINE  06/25/2018  . TETANUS/TDAP  02/04/2026  . HIV Screening  Completed       Review of Systems Pertinent items are noted in HPI.   Objective:  Blood pressure (!) 141/87, pulse (!) 112, height 5' 8.5" (1.74 m), weight 277 lb 3.2 oz (125.7 kg).     GENERAL: Well-developed, well-nourished female in no acute distress.  HEENT: Normocephalic, atraumatic. Sclerae anicteric.  NECK: Supple. Normal thyroid.  LUNGS: Clear to auscultation bilaterally.  HEART: Regular rate and rhythm. BREASTS: Symmetric in size. No palpable masses or lymphadenopathy, skin changes, or nipple drainage. ABDOMEN: Soft, nontender, nondistended. No organomegaly. PELVIC: Normal external female genitalia. Vagina is pink and rugated.   Normal discharge. Normal appearing cervix. Uterus is normal in size. No adnexal mass or tenderness. EXTREMITIES: No cyanosis, clubbing, or edema, 2+ distal pulses.    Assessment:    Healthy female exam.      Plan:    pap smear collected Wet prep collected FSH, LH collected to rule out premature menopause Patient with elevated BP today and denies a history of HTN Health maintenance labs ordered Patient will be contacted with abnormal results See After Visit Summary for Counseling Recommendations

## 2018-04-27 NOTE — Progress Notes (Signed)
Pt presents for annual, pap, and all STD's. Pt c/o hot flashes. Pt also c/o thick yellow vaginal discharge with odor and recurrent yeast infections. Pt has dyspareunia and dysuria.

## 2018-04-28 LAB — COMPREHENSIVE METABOLIC PANEL
A/G RATIO: 1.8 (ref 1.2–2.2)
ALBUMIN: 4.4 g/dL (ref 3.5–5.5)
ALK PHOS: 81 IU/L (ref 39–117)
ALT: 15 IU/L (ref 0–32)
AST: 11 IU/L (ref 0–40)
BILIRUBIN TOTAL: 0.4 mg/dL (ref 0.0–1.2)
BUN / CREAT RATIO: 9 (ref 9–23)
BUN: 8 mg/dL (ref 6–20)
CHLORIDE: 105 mmol/L (ref 96–106)
CO2: 22 mmol/L (ref 20–29)
Calcium: 8.9 mg/dL (ref 8.7–10.2)
Creatinine, Ser: 0.88 mg/dL (ref 0.57–1.00)
GFR calc non Af Amer: 84 mL/min/{1.73_m2} (ref >59)
GFR, EST AFRICAN AMERICAN: 97 mL/min/{1.73_m2} (ref >59)
Globulin, Total: 2.5 g/dL (ref 1.5–4.5)
Glucose: 92 mg/dL (ref 65–99)
POTASSIUM: 3.6 mmol/L (ref 3.5–5.2)
Sodium: 140 mmol/L (ref 134–144)
TOTAL PROTEIN: 6.9 g/dL (ref 6.0–8.5)

## 2018-04-28 LAB — HEMOGLOBIN A1C
Est. average glucose Bld gHb Est-mCnc: 105 mg/dL
Hgb A1c MFr Bld: 5.3 % (ref 4.8–5.6)

## 2018-04-28 LAB — HEPATITIS C ANTIBODY

## 2018-04-28 LAB — HIV ANTIBODY (ROUTINE TESTING W REFLEX): HIV SCREEN 4TH GENERATION: NONREACTIVE

## 2018-04-28 LAB — CERVICOVAGINAL ANCILLARY ONLY
BACTERIAL VAGINITIS: NEGATIVE
Candida vaginitis: NEGATIVE
Chlamydia: NEGATIVE
NEISSERIA GONORRHEA: NEGATIVE
Trichomonas: NEGATIVE

## 2018-04-28 LAB — CBC
Hematocrit: 36.2 % (ref 34.0–46.6)
Hemoglobin: 11.1 g/dL (ref 11.1–15.9)
MCH: 19.4 pg — AB (ref 26.6–33.0)
MCHC: 30.7 g/dL — AB (ref 31.5–35.7)
MCV: 63 fL — AB (ref 79–97)
Platelets: 301 10*3/uL (ref 150–450)
RBC: 5.73 x10E6/uL — AB (ref 3.77–5.28)
RDW: 16.8 % — ABNORMAL HIGH (ref 12.3–15.4)
WBC: 6.5 10*3/uL (ref 3.4–10.8)

## 2018-04-28 LAB — RPR: RPR: NONREACTIVE

## 2018-04-28 LAB — HEPATITIS B SURFACE ANTIGEN: Hepatitis B Surface Ag: NEGATIVE

## 2018-04-28 LAB — FOLLICLE STIMULATING HORMONE: FSH: 0.4 m[IU]/mL

## 2018-04-28 LAB — TSH: TSH: 2.12 u[IU]/mL (ref 0.450–4.500)

## 2018-04-28 LAB — LUTEINIZING HORMONE

## 2018-04-29 ENCOUNTER — Telehealth: Payer: Self-pay | Admitting: *Deleted

## 2018-04-29 LAB — CYTOLOGY - PAP
Diagnosis: NEGATIVE
HPV (WINDOPATH): NOT DETECTED

## 2018-04-29 LAB — URINE CULTURE

## 2018-04-29 NOTE — Telephone Encounter (Signed)
Pt called to office for lab results.  Results given, pt made aware all labs WNL. Pt made aware Pap is still pending.  Pt would like to know if she needs a f/u visit to discuss labs- FSH, LH, ?menopausal sx.  Pt made aware message to be sent to provider for recommendations.   Please advise.

## 2018-04-30 ENCOUNTER — Other Ambulatory Visit: Payer: Self-pay | Admitting: Obstetrics and Gynecology

## 2018-04-30 DIAGNOSIS — N911 Secondary amenorrhea: Secondary | ICD-10-CM

## 2018-04-30 NOTE — Telephone Encounter (Signed)
Pt made aware of lab and will try to come today or tomorrow.

## 2018-04-30 NOTE — Telephone Encounter (Signed)
LM on VM to call office.

## 2018-05-04 ENCOUNTER — Other Ambulatory Visit: Payer: Medicaid Other

## 2018-05-04 DIAGNOSIS — N911 Secondary amenorrhea: Secondary | ICD-10-CM

## 2018-05-05 LAB — PROLACTIN: Prolactin: 13 ng/mL (ref 4.8–23.3)

## 2018-05-13 ENCOUNTER — Telehealth: Payer: Self-pay

## 2018-05-13 ENCOUNTER — Ambulatory Visit: Payer: Medicaid Other | Admitting: Pulmonary Disease

## 2018-05-13 ENCOUNTER — Encounter: Payer: Self-pay | Admitting: Pulmonary Disease

## 2018-05-13 VITALS — BP 126/82 | HR 84 | Ht 68.5 in | Wt 270.6 lb

## 2018-05-13 DIAGNOSIS — J4540 Moderate persistent asthma, uncomplicated: Secondary | ICD-10-CM

## 2018-05-13 DIAGNOSIS — G4733 Obstructive sleep apnea (adult) (pediatric): Secondary | ICD-10-CM

## 2018-05-13 DIAGNOSIS — R1310 Dysphagia, unspecified: Secondary | ICD-10-CM | POA: Diagnosis not present

## 2018-05-13 DIAGNOSIS — R1313 Dysphagia, pharyngeal phase: Secondary | ICD-10-CM | POA: Diagnosis not present

## 2018-05-13 LAB — NITRIC OXIDE: Nitric Oxide: 11

## 2018-05-13 MED ORDER — MOMETASONE FURO-FORMOTEROL FUM 200-5 MCG/ACT IN AERO: 2.0000 | INHALATION_SPRAY | Freq: Two times a day (BID) | RESPIRATORY_TRACT | 5 refills | Status: DC

## 2018-05-13 MED ORDER — ALBUTEROL SULFATE HFA 108 (90 BASE) MCG/ACT IN AERS: 2.0000 | INHALATION_SPRAY | RESPIRATORY_TRACT | 3 refills | Status: DC | PRN

## 2018-05-13 NOTE — Assessment & Plan Note (Signed)
Given excessive daytime somnolence, narrow pharyngeal exam, witnessed apneas & loud snoring, obstructive sleep apnea is very likely & an overnight polysomnogram will be scheduled as a split study. The pathophysiology of obstructive sleep apnea , it's cardiovascular consequences & modes of treatment including CPAP were discused with the patient in detail & they evidenced understanding.  Pretest probability is high 

## 2018-05-13 NOTE — Patient Instructions (Signed)
Refills on albuterol and Dulera 100 Schedule split study.  Schedule swallowing study

## 2018-05-13 NOTE — Telephone Encounter (Signed)
TC from pt requesting refill on her Birth control Rx I was reading notes I saw you wanted to encourage pt to discontinue.  Pt states it was depending on labs  Pt wants refill of "Camrese" pt states this brand works for here. Is it Ok to send?

## 2018-05-13 NOTE — Progress Notes (Signed)
Subjective:    Patient ID: Crystal Myers, female    DOB: 1981-07-24, 37 y.o.   MRN: [id number]  HPI  Chief Complaint  Patient presents with  . Sleep Consult    Referred by [facility]. Providence LaniusHowell at [facility] for snoring and asthma. Has been out of her Dulera and alb for the past few weeks. Per patient, she coughs at night. She had a SS back in 2017, when she was smaller. Has gained 80 lbs since last test.     37 year old obese woman presents to establish care for asthma and sleep disordered breathing.  She also has dysphagia which she would like to get checked out.  She has a past medical history of bipolar disorder, anxiety and ADHD and is on medications for these. She reports mild intermittent asthma symptoms starting in her 3520s, triggers being URI, allergies and heat.  She underwent formal allergy testing and was found to be allergic to dust mite, was advised immuno therapy but did not keep follow-up due to the long drive. She has been off Dulera for the past year, Advair did not work well in the past, for the past 3 months she reports increasing symptoms of coughing and intermittent wheezing, PCP for some reason did not give her refills on her albuterol and Dulera.  She smoked about a pack per day for 10 years before she quit 4 years ago. She has 4 kids aged 16/13/11 and 38-year-old autistic son.  Epworth sleepiness score is 7 she reports sleepiness while lying down to rest in the afternoons or as a passenger in a car. She reports loud snoring and coughing episodes that wake her up from sleep.  She also reports flashes and night sweats and her GYN has checked her out for premature menopause and she is awaiting results.  She has a family history of this Bedtime is between 10:11 PM, sleep latency can be up to 2 to 3 hours, she was given Ambien but is afraid of not being awake for her kids if needed and she has cut down usage to about thrice in the last month, she sleeps on her side with 2 pillows,  reports 2-3 nocturnal awakenings including nocturia and is out of bed by 7 AM feeling tired that morning headaches or dryness of mouth. She reports occasional migraine headaches in the afternoons. She has gained about 80 pounds in the last 3 years. She [facility] a few years ago and was told that she was borderline and that if she gained weight this would get worse  There is no history suggestive of cataplexy, sleep paralysis or parasomnias    Significant tests/ events reviewed  08/2017 eos 400  nml swallow study 05/2015  FENO 11 Spirometry was normal with ratio of 88, FEV1 of 95%, FVC of 88%   Past Surgical History:  Procedure Laterality Date  . ADENOIDECTOMY    . APPENDECTOMY    . CERVICAL BIOPSY  W/ LOOP ELECTRODE EXCISION    . CERVICAL POLYPECTOMY    . CESAREAN SECTION    . CESAREAN SECTION     x4  . CHOLECYSTECTOMY N/A 12/24/2014   Procedure: LAPAROSCOPIC CHOLECYSTECTOMY WITH INTRAOPERATIVE CHOLANGIOGRAM;  Surgeon: Glenna FellowsBenjamin Hoxworth, MD;  Location: [facility];  Service: General;  Laterality: N/A;  . DILATION AND CURETTAGE OF UTERUS    . LYMPHADENECTOMY    . TONSILLECTOMY    . TUBAL LIGATION      Allergies  Allergen Reactions  .  Ferrous Gluconate Nausea Only  . Chantix [Varenicline] Other (See Comments)    Nightmare   . Dust Mite Mixed Allergen Ext [Mite (D. Farinae)]   . Other     GRASS and POLLEN   . Oxycodone Itching  . Amoxapine Itching  . Augmentin [Amoxicillin-Pot Clavulanate] Itching    Has patient had a PCN reaction causing immediate rash, facial/tongue/throat swelling, SOB or lightheadedness with hypotension: Yes Has patient had a PCN reaction causing severe rash involving mucus membranes or skin necrosis: Yes Has patient had a PCN reaction that required hospitalization No Has patient had a PCN reaction occurring within the last 10 years: No If all of the above answers are "NO", then may proceed with Cephalosporin use.   Marland Kitchen  Penicillins Rash    Has patient had a PCN reaction causing immediate rash, facial/tongue/throat swelling, SOB or lightheadedness with hypotension: No Has patient had a PCN reaction causing severe rash involving mucus membranes or skin necrosis: No Has patient had a PCN reaction that required hospitalization No Has patient had a PCN reaction occurring within the last 10 years: No If all of the above answers are "NO", then may proceed with Cephalosporin use.     Social History   Socioeconomic History  . Marital status: Single    Spouse name: Not on file  . Number of children: 4  . Years of education: HS  . Highest education level: Not on file  Occupational History  . Occupation: N/A  Social Needs  . Financial resource strain: Not on file  . Food insecurity:    Worry: Not on file    Inability: Not on file  . Transportation needs:    Medical: Not on file    Non-medical: Not on file  Tobacco Use  . Smoking status: Former Smoker    Packs/day: 0.50    Types: Cigarettes    Last attempt to quit: 11/25/2014    Years since quitting: 3.4  . Smokeless tobacco: Never Used  Substance and Sexual Activity  . Alcohol use: No    Alcohol/week: 0.0 oz    Comment: former  . Drug use: No  . Sexual activity: Yes    Birth control/protection: None, Surgical, Pill  Lifestyle  . Physical activity:    Days per week: Not on file    Minutes per session: Not on file  . Stress: Not on file  Relationships  . Social connections:    Talks on phone: Not on file    Gets together: Not on file    Attends religious service: Not on file    Active member of club or organization: Not on file    Attends meetings of clubs or organizations: Not on file    Relationship status: Not on file  . Intimate partner violence:    Fear of current or ex partner: Not on file    Emotionally abused: Not on file    Physically abused: Not on file    Forced sexual activity: Not on file  Other Topics Concern  . Not on file    Social History Narrative   Drinks about 1 soda a week      Family History  Problem Relation Age of Onset  . Diabetes Mother   . Hypertension Mother   . CAD Mother   . Allergic rhinitis Mother   . Asthma Sister      Review of Systems  Constitutional: Positive for unexpected weight change. Negative for fever.  HENT: Negative for congestion,  dental problem, ear pain, nosebleeds, postnasal drip, rhinorrhea, sinus pressure, sneezing, sore throat and trouble swallowing.   Eyes: Negative for redness and itching.  Respiratory: Positive for cough, chest tightness and shortness of breath. Negative for wheezing.   Cardiovascular: Negative for palpitations and leg swelling.  Gastrointestinal: Negative for nausea and vomiting.  Genitourinary: Negative for dysuria.  Musculoskeletal: Positive for joint swelling.  Skin: Negative for rash.  Allergic/Immunologic: Negative.  Negative for environmental allergies, food allergies and immunocompromised state.  Neurological: Positive for headaches.  Hematological: Does not bruise/bleed easily.  Psychiatric/Behavioral: Negative for dysphoric mood. The patient is nervous/anxious.        Objective:   Physical Exam   Gen. Pleasant, obese, in no distress, normal affect ENT - no lesions, no post nasal drip, class 2-3 airway Neck: No JVD, no thyromegaly, no carotid bruits Lungs: no use of accessory muscles, no dullness to percussion, decreased without rales or rhonchi  Cardiovascular: Rhythm regular, heart sounds  normal, no murmurs or gallops, no peripheral edema Abdomen: soft and non-tender, no hepatosplenomegaly, BS normal. Musculoskeletal: No deformities, no cyanosis or clubbing Neuro:  alert, non focal, no tremors        Assessment & Plan:

## 2018-05-13 NOTE — Assessment & Plan Note (Signed)
Unclear cause  we will schedule swallowing study with therapist present

## 2018-05-14 ENCOUNTER — Other Ambulatory Visit: Payer: Self-pay

## 2018-05-14 DIAGNOSIS — N912 Amenorrhea, unspecified: Secondary | ICD-10-CM

## 2018-05-14 MED ORDER — LEVONORGEST-ETH ESTRAD 91-DAY 0.15-0.03 &0.01 MG PO TABS: 1.0000 | ORAL_TABLET | Freq: Every day | ORAL | 3 refills | Status: AC

## 2018-05-14 NOTE — Progress Notes (Signed)
Ok per [facility] to refill pt Birth control pills. Rx sent.

## 2018-05-15 ENCOUNTER — Other Ambulatory Visit (HOSPITAL_COMMUNITY): Payer: Self-pay | Admitting: Pulmonary Disease

## 2018-05-15 DIAGNOSIS — R1310 Dysphagia, unspecified: Secondary | ICD-10-CM

## 2018-05-20 ENCOUNTER — Ambulatory Visit (HOSPITAL_COMMUNITY)
Admission: RE | Admit: 2018-05-20 | Discharge: 2018-05-20 | Disposition: A | Discharge status: Home / Self Care | Payer: Medicaid Other | Source: Ambulatory Visit | Attending: Pulmonary Disease | Admitting: Pulmonary Disease

## 2018-05-20 ENCOUNTER — Telehealth: Payer: Self-pay | Admitting: Pulmonary Disease

## 2018-05-20 DIAGNOSIS — R1310 Dysphagia, unspecified: Secondary | ICD-10-CM | POA: Diagnosis not present

## 2018-05-20 DIAGNOSIS — R221 Localized swelling, mass and lump, neck: Secondary | ICD-10-CM

## 2018-05-20 MED ORDER — EPINEPHRINE 0.15 MG/0.3ML IJ SOAJ: 0.1500 mg | INTRAMUSCULAR | 0 refills | Status: DC | PRN

## 2018-05-20 NOTE — Telephone Encounter (Signed)
[facility] with Marchelle Folksmanda who stated a note was posted regarding pt's imaging. 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 Recommendation was made to get a CT neck with contrast and possibly a referral to ENT.  Marchelle Folksmanda stated that pt is unaware of what was seen on the imaging.  Dr. Vassie LollAlva, please review this and let us know what you want us to order for pt to have done.  Also, please advise if you are going to call pt based on the study or you want us to call her, and if you want us to call her please advise exact results of imaging.  Thanks!

## 2018-05-20 NOTE — Telephone Encounter (Signed)
Spoke with pt and advised rx sent to pharmacy. Nothing further is needed.   

## 2018-05-20 NOTE — Telephone Encounter (Signed)
Pt is calling back   [phone]

## 2018-05-20 NOTE — Progress Notes (Signed)
Speech Pathology:  Pt referred for OP MBS.  See report in imaging: Pt presents with normal oropharyngeal swallow physiology.  There was adequate airway protection and propulsion of bolus material. 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 Ova FreshwaterLiebkemann, radiologist, who reviewed images and recommended CT neck with contrast.  Phone message left with Dr. Vassie LollAlva.    Crystal Myers, [place]MA CCC/SLP Pager [phone]

## 2018-05-20 NOTE — Telephone Encounter (Signed)
Attempted to call pt. I did not receive an answer. I have left a message for pt to return our call.  

## 2018-05-20 NOTE — Telephone Encounter (Signed)
Okay to refill x1 

## 2018-05-20 NOTE — Telephone Encounter (Signed)
Spoke with pt, she is requesting a Rx for an Epi Pen. She didn't realize her Epi pen was expired and she told RA that she didn't need one but wants to have one on hand. RA can we send in RX, you weren't the original prescriber so I wanted to make sure. Please advise.   [facility] [address]  Current Outpatient Medications on File Prior to Visit  Medication Sig Dispense Refill  . albuterol (PROAIR HFA) 108 (90 Base) MCG/ACT inhaler Inhale 2 puffs into the lungs every 4 (four) hours as needed for wheezing or shortness of breath. 1 Inhaler 3  . aspirin-acetaminophen-caffeine (EXCEDRIN MIGRAINE) 250-250-65 MG tablet Take 2 tablets by mouth every 12 (twelve) hours as needed for headache or migraine.    . citalopram (CELEXA) 20 MG tablet Take by mouth.    . cloNIDine (CATAPRES) 0.1 MG tablet Take 0.1 mg by mouth at bedtime.  1  . EPINEPHrine (EPIPEN 2-PAK) 0.3 mg/0.3 mL IJ SOAJ injection Inject 1 Dose as directed as needed (for allergic reaction).     Marland Kitchen EPINEPHrine (EPIPEN JR 2-PAK) 0.15 MG/0.3ML injection Inject into the skin.    Marland Kitchen ibuprofen (ADVIL,MOTRIN) 200 MG tablet Take 600 mg by mouth every 6 (six) hours as needed for headache or moderate pain.    Marland Kitchen ipratropium-albuterol (DUONEB) 0.5-2.5 (3) MG/3ML SOLN Take 3 mLs by nebulization every 2 (two) hours as needed. 360 mL 0  . lamoTRIgine (LAMICTAL) 150 MG tablet Take by mouth.    . Levonorgestrel-Ethinyl Estradiol (CAMRESE) 0.15-0.03 &0.01 MG tablet Take 1 tablet by mouth daily. 1 Package 3  . mometasone-formoterol (DULERA) 200-5 MCG/ACT AERO Inhale 2 puffs into the lungs 2 (two) times daily. 1 Inhaler 5  . montelukast (SINGULAIR) 10 MG tablet Take 1 tablet (10 mg total) by mouth at bedtime. 30 tablet 5  . Multiple Vitamin (MULTIVITAMIN WITH MINERALS) TABS tablet Take 1 tablet by mouth daily.    . Olopatadine HCl (PAZEO) 0.7 % SOLN Place 1 drop into both eyes 1 day or 1 dose. 1 Bottle 5  . VYVANSE 40 MG capsule Take 40 mg by mouth daily.  0  .  zolpidem (AMBIEN) 10 MG tablet Take by mouth.     No current facility-administered medications on file prior to visit.    Allergies  Allergen Reactions  . Ferrous Gluconate Nausea Only  . Chantix [Varenicline] Other (See Comments)    Nightmare   . Dust Mite Mixed Allergen Ext [Mite (D. Farinae)]   . Other     GRASS and POLLEN   . Oxycodone Itching  . Amoxapine Itching  . Augmentin [Amoxicillin-Pot Clavulanate] Itching    Has patient had a PCN reaction causing immediate rash, facial/tongue/throat swelling, SOB or lightheadedness with hypotension: Yes Has patient had a PCN reaction causing severe rash involving mucus membranes or skin necrosis: Yes Has patient had a PCN reaction that required hospitalization No Has patient had a PCN reaction occurring within the last 10 years: No If all of the above answers are "NO", then may proceed with Cephalosporin use.   Marland Kitchen Penicillins Rash    Has patient had a PCN reaction causing immediate rash, facial/tongue/throat swelling, SOB or lightheadedness with hypotension: No Has patient had a PCN reaction causing severe rash involving mucus membranes or skin necrosis: No Has patient had a PCN reaction that required hospitalization No Has patient had a PCN reaction occurring within the last 10 years: No If all of the above answers are "NO", then may proceed  with Cephalosporin use.

## 2018-05-21 NOTE — Telephone Encounter (Signed)
Orders have been placed.

## 2018-05-21 NOTE — Telephone Encounter (Signed)
I called her to provide results but left voicemail  Reviewed swallowing test-has a mass in her throat/epiglottis. Please schedule ENT consult ASAP and CT neck with IV contrast

## 2018-05-22 ENCOUNTER — Inpatient Hospital Stay: Admission: RE | Admit: 2018-05-22 | Payer: Medicaid Other | Source: Ambulatory Visit

## 2018-05-22 NOTE — Telephone Encounter (Signed)
RA spoke with patient yesterday. Will close this encounter.

## 2018-05-22 NOTE — Telephone Encounter (Signed)
Crystal Myers, Is the patient aware of order and why it was placed?

## 2018-05-25 ENCOUNTER — Inpatient Hospital Stay: Admission: RE | Admit: 2018-05-25 | Payer: Medicaid Other | Source: Ambulatory Visit

## 2018-05-27 ENCOUNTER — Ambulatory Visit (INDEPENDENT_AMBULATORY_CARE_PROVIDER_SITE_OTHER)
Admission: RE | Admit: 2018-05-27 | Discharge: 2018-05-27 | Disposition: A | Discharge status: Home / Self Care | Payer: Medicaid Other | Source: Ambulatory Visit | Attending: Pulmonary Disease | Admitting: Pulmonary Disease

## 2018-05-27 DIAGNOSIS — R221 Localized swelling, mass and lump, neck: Secondary | ICD-10-CM

## 2018-05-27 MED ORDER — IOPAMIDOL (ISOVUE-300) INJECTION 61%
75.0000 mL | Freq: Once | INTRAVENOUS | Status: AC | PRN
  Administered 2018-05-27: 75 mL via INTRAVENOUS

## 2018-06-01 ENCOUNTER — Encounter (HOSPITAL_COMMUNITY): Payer: Self-pay

## 2018-06-01 ENCOUNTER — Other Ambulatory Visit: Payer: Self-pay

## 2018-06-01 DIAGNOSIS — J45909 Unspecified asthma, uncomplicated: Secondary | ICD-10-CM | POA: Insufficient documentation

## 2018-06-01 DIAGNOSIS — Z87891 Personal history of nicotine dependence: Secondary | ICD-10-CM | POA: Insufficient documentation

## 2018-06-01 DIAGNOSIS — Z79899 Other long term (current) drug therapy: Secondary | ICD-10-CM | POA: Diagnosis not present

## 2018-06-01 DIAGNOSIS — T7840XA Allergy, unspecified, initial encounter: Secondary | ICD-10-CM | POA: Diagnosis not present

## 2018-06-01 DIAGNOSIS — R3121 Asymptomatic microscopic hematuria: Secondary | ICD-10-CM | POA: Diagnosis not present

## 2018-06-01 DIAGNOSIS — R21 Rash and other nonspecific skin eruption: Secondary | ICD-10-CM | POA: Diagnosis present

## 2018-06-01 NOTE — ED Notes (Signed)
Triage RN aware of blood pressure reading @ 2021.

## 2018-06-01 NOTE — ED Triage Notes (Addendum)
Pt c/o increasing generalized rash spreading over body x1 week. Barium swallow done x2 weeks ago, mass found behind tongue. Pt also reports intermittent fever. Afebrile today. Unsure if rash is coming from some type of bacterial infection or from barium swallow, waiting for biopsy of mass at the end of the month. Pt reports rash is getting worse and eyes are itching. Tried benadryl, topical ointments, no relief.

## 2018-06-02 ENCOUNTER — Encounter (HOSPITAL_COMMUNITY): Payer: Self-pay | Admitting: Emergency Medicine

## 2018-06-02 ENCOUNTER — Emergency Department (HOSPITAL_COMMUNITY)
Admission: EM | Admit: 2018-06-02 | Discharge: 2018-06-02 | Disposition: A | Discharge status: Home / Self Care | Payer: Medicaid Other | Attending: Emergency Medicine | Admitting: Emergency Medicine

## 2018-06-02 DIAGNOSIS — T7840XA Allergy, unspecified, initial encounter: Secondary | ICD-10-CM

## 2018-06-02 MED ORDER — PREDNISONE 20 MG PO TABS: ORAL_TABLET | ORAL | 0 refills | Status: DC

## 2018-06-02 MED ORDER — PREDNISONE 20 MG PO TABS
60.0000 mg | ORAL_TABLET | Freq: Once | ORAL | Status: AC
  Administered 2018-06-02: 60 mg via ORAL
  Filled 2018-06-02: qty 3

## 2018-06-02 MED ORDER — FAMOTIDINE 20 MG PO TABS
20.0000 mg | ORAL_TABLET | Freq: Once | ORAL | Status: AC
  Administered 2018-06-02: 20 mg via ORAL
  Filled 2018-06-02: qty 1

## 2018-06-02 MED ORDER — FAMOTIDINE 20 MG PO TABS: 20.0000 mg | ORAL_TABLET | Freq: Two times a day (BID) | ORAL | 0 refills | Status: DC

## 2018-06-02 NOTE — ED Provider Notes (Signed)
[facility]-EMERGENCY DEPT Provider Note   CSN: [id number] Arrival date & time: 06/01/18  1916     History   Chief Complaint Chief Complaint  Patient presents with  . Rash    HPI Crystal Myers is a 37 y.o. female.  The history is provided by the patient.  Rash   This is a new problem. The current episode started more than 1 week ago. The problem has been gradually worsening. The problem is associated with an unknown factor. There has been no fever. Affected Location: right neck upper chest and scan BUE proximally  The pain is at a severity of 0/10. The patient is experiencing no pain. Associated symptoms include itching. Pertinent negatives include no pain and no weeping. Crystal Myers has tried anti-itch cream for the symptoms. The treatment provided no relief. Risk factors: unknown, Crystal Myers thought it might be barium,  Allergic Reaction  Presenting symptoms: itching and rash   Presenting symptoms: no difficulty breathing, no difficulty swallowing, no swelling and no wheezing   Severity:  Mild Duration:  1 week Prior allergic episodes:  Allergies to medications Context: not animal exposure, not food allergies, not grass, not new detergents/soaps and not nuts   Relieved by:  Antihistamines Worsened by:  Nothing Ineffective treatments:  OTC ointments and antihistamines   Past Medical History:  Diagnosis Date  . ADHD   . Anemia   . Anxiety   . Asthma   . Chlamydia   . Depression   . Headache     Patient Active Problem List   Diagnosis Date Noted  . OSA (obstructive sleep apnea) 05/13/2018  . Dysphagia 05/13/2018  . Perennial and seasonal allergic rhinitis 07/08/2016  . Allergic conjunctivitis 07/08/2016  . Moderate persistent asthma 07/08/2016  . Generalized pruritus 07/08/2016  . Depression 12/24/2014  . Acute calculous cholecystitis s/p lap chole 12/24/2014 12/23/2014  . Migraine 02/11/2013    Past Surgical History:  Procedure Laterality Date  .  ADENOIDECTOMY    . APPENDECTOMY    . CERVICAL BIOPSY  W/ LOOP ELECTRODE EXCISION    . CERVICAL POLYPECTOMY    . CESAREAN SECTION    . CESAREAN SECTION     x4  . CHOLECYSTECTOMY N/A 12/24/2014   Procedure: LAPAROSCOPIC CHOLECYSTECTOMY WITH INTRAOPERATIVE CHOLANGIOGRAM;  Surgeon: Glenna Fellows, MD;  Location: [facility]S;  Service: General;  Laterality: N/A;  . DILATION AND CURETTAGE OF UTERUS    . LYMPHADENECTOMY    . TONSILLECTOMY    . TUBAL LIGATION       OB History    Gravida  5   Para  5   Term  3   Preterm  2   AB      Living  3     SAB      TAB      Ectopic      Multiple      Live Births  4            Home Medications    Prior to Admission medications   Medication Sig Start Date End Date Taking? Authorizing Provider  albuterol (PROAIR HFA) 108 (90 Base) MCG/ACT inhaler Inhale 2 puffs into the lungs every 4 (four) hours as needed for wheezing or shortness of breath. 05/13/18  Yes Oretha Milch, MD  citalopram (CELEXA) 20 MG tablet Take 20 mg by mouth daily.    Yes [provider]  EPINEPHrine (EPIPEN 2-PAK) 0.3 mg/0.3 mL IJ SOAJ injection Inject 1 Dose as directed  as needed (for allergic reaction).  08/19/14  Yes [provider]  guanFACINE (INTUNIV) 1 MG TB24 ER tablet Take 1 mg by mouth daily. 05/13/18  Yes [provider]  ibuprofen (ADVIL,MOTRIN) 200 MG tablet Take 600 mg by mouth every 6 (six) hours as needed for headache or moderate pain.   Yes [provider]  ipratropium-albuterol (DUONEB) 0.5-2.5 (3) MG/3ML SOLN Take 3 mLs by nebulization every 2 (two) hours as needed. Patient taking differently: Take 3 mLs by nebulization every 2 (two) hours as needed (for wheezing).  10/03/15  Yes Barrett, Rolm GalaStevi, PA-C  lamoTRIgine (LAMICTAL) 150 MG tablet Take 150 mg by mouth daily.    Yes [provider]  Levonorgestrel-Ethinyl Estradiol (CAMRESE) 0.15-0.03 &0.01 MG tablet Take 1 tablet by mouth daily. 05/14/18  Yes  Constant, Peggy, MD  levothyroxine (SYNTHROID, LEVOTHROID) 25 MCG tablet Take 25 mcg by mouth daily. 04/30/18  Yes [provider]  mometasone-formoterol (DULERA) 200-5 MCG/ACT AERO Inhale 2 puffs into the lungs 2 (two) times daily. 05/13/18  Yes Oretha MilchAlva, Rakesh V, MD  VYVANSE 40 MG capsule Take 40 mg by mouth daily. 09/01/17  Yes [provider]  zolpidem (AMBIEN) 10 MG tablet Take 10 mg by mouth at bedtime.    Yes [provider]  EPINEPHrine (EPIPEN JR 2-PAK) 0.15 MG/0.3ML injection Inject 0.3 mLs (0.15 mg total) into the muscle as needed for anaphylaxis. Patient not taking: Reported on 06/02/2018 05/20/18   Oretha MilchAlva, Rakesh V, MD  famotidine (PEPCID) 20 MG tablet Take 1 tablet (20 mg total) by mouth 2 (two) times daily. 06/02/18   Grover Woodfield, Shernell, MD  predniSONE (DELTASONE) 20 MG tablet 3 tabs po day one, then 2 po daily x 4 days 06/02/18   Nicanor AlconPalumbo, Edmonia, MD    Family History Family History  Problem Relation Age of Onset  . Diabetes Mother   . Hypertension Mother   . CAD Mother   . Allergic rhinitis Mother   . Asthma Sister     Social History Social History   Tobacco Use  . Smoking status: Former Smoker    Packs/day: 0.50    Types: Cigarettes    Last attempt to quit: 11/25/2014    Years since quitting: 3.5  . Smokeless tobacco: Never Used  Substance Use Topics  . Alcohol use: No    Alcohol/week: 0.0 oz    Comment: former  . Drug use: No     Allergies   Ferrous gluconate; Chantix [varenicline]; Dust mite mixed allergen ext [mite (d. farinae)]; Other; Oxycodone; Amoxapine; Augmentin [amoxicillin-pot clavulanate]; and Penicillins   Review of Systems Review of Systems  Constitutional: Negative for diaphoresis and fever.  HENT: Negative for drooling, facial swelling, trouble swallowing and voice change.   Respiratory: Negative for shortness of breath, wheezing and stridor.   Cardiovascular: Negative for chest pain.  Gastrointestinal: Negative for abdominal pain.   Skin: Positive for itching and rash. Negative for wound.  All other systems reviewed and are negative.    Physical Exam Updated Vital Signs BP 138/82   Pulse 79   Temp 98.5 F (36.9 C) (Oral)   Resp 18   Ht 5\' 8"  (1.727 m)   Wt 122.9 kg (271 lb)   SpO2 100%   BMI 41.21 kg/m   Physical Exam  Constitutional: Crystal Myers is oriented to person, place, and time. Crystal Myers appears well-developed and well-nourished. No distress.  HENT:  Head: Normocephalic and atraumatic.  Mouth/Throat: No oropharyngeal exudate.  No swelling of the lips tongue  or uvula  Eyes: Pupils are equal, round, and reactive to light. Conjunctivae are normal.  Neck: Normal range of motion. Neck supple. No JVD present.  Cardiovascular: Normal rate, regular rhythm, normal heart sounds and intact distal pulses.  Pulmonary/Chest: Effort normal and breath sounds normal. No stridor. No respiratory distress. Crystal Myers has no wheezes. Crystal Myers has no rales.  Abdominal: Soft. Bowel sounds are normal. Crystal Myers exhibits no mass. There is no tenderness. There is no rebound and no guarding.  Musculoskeletal: Normal range of motion.  Neurological: Crystal Myers is alert and oriented to person, place, and time.  Skin: Skin is warm and dry. Capillary refill takes less than 2 seconds.  Papular eruption of the right neck upper chest and proximal flexor surfaces of the arms.    Psychiatric: Crystal Myers has a normal mood and affect.     ED Treatments / Results  Labs (all labs ordered are listed, but only abnormal results are displayed) Labs Reviewed - No data to display  EKG None  Radiology No results found.  Procedures Procedures (including critical care time)  Medications Ordered in ED Medications  predniSONE (DELTASONE) tablet 60 mg (60 mg Oral Given 06/02/18 0359)  famotidine (PEPCID) tablet 20 mg (20 mg Oral Given 06/02/18 0359)       Final Clinical Impressions(s) / ED Diagnoses   Final diagnoses:  Allergic reaction, initial encounter    Return for  pain, numbness, changes in vision or speech, fevers >100.4 unrelieved by medication, shortness of breath, intractable vomiting, or diarrhea, abdominal pain, Inability to tolerate liquids or food, cough, altered mental status or any concerns. No signs of systemic illness or infection. The patient is nontoxic-appearing on exam and vital signs are within normal limits. Will refer to urology for microscopy hematuria as patient is asymptomatic.  I have reviewed the triage vital signs and the nursing notes. Pertinent labs &imaging results that were available during my care of the patient were reviewed by me and considered in my medical decision making (see chart for details).  After history, exam, and medical workup I feel the patient has been appropriately medically screened and is safe for discharge home. Pertinent diagnoses were discussed with the patient. Patient was given return precautions.   ED Discharge Orders        Ordered    predniSONE (DELTASONE) 20 MG tablet     06/02/18 0443    famotidine (PEPCID) 20 MG tablet  2 times daily     06/02/18 0443       Sallye Lunz, Keondra, MD 06/02/18 4098

## 2018-06-07 ENCOUNTER — Encounter (HOSPITAL_BASED_OUTPATIENT_CLINIC_OR_DEPARTMENT_OTHER): Payer: Medicaid Other

## 2018-06-16 ENCOUNTER — Other Ambulatory Visit: Payer: Self-pay | Admitting: Otolaryngology

## 2018-06-16 ENCOUNTER — Other Ambulatory Visit (HOSPITAL_COMMUNITY)
Admission: RE | Admit: 2018-06-16 | Discharge: 2018-06-16 | Disposition: A | Discharge status: Home / Self Care | Payer: Medicaid Other | Source: Ambulatory Visit | Attending: Otolaryngology | Admitting: Otolaryngology

## 2018-06-16 DIAGNOSIS — K148 Other diseases of tongue: Secondary | ICD-10-CM | POA: Diagnosis not present

## 2018-07-23 ENCOUNTER — Ambulatory Visit (HOSPITAL_COMMUNITY)
Admission: RE | Admit: 2018-07-23 | Discharge: 2018-07-23 | Disposition: A | Discharge status: Home / Self Care | Payer: Medicaid Other | Source: Ambulatory Visit | Attending: Nurse Practitioner | Admitting: Nurse Practitioner

## 2018-07-23 ENCOUNTER — Other Ambulatory Visit (HOSPITAL_COMMUNITY): Payer: Self-pay | Admitting: Nurse Practitioner

## 2018-07-23 DIAGNOSIS — R112 Nausea with vomiting, unspecified: Secondary | ICD-10-CM | POA: Diagnosis present

## 2018-07-23 DIAGNOSIS — K5900 Constipation, unspecified: Secondary | ICD-10-CM | POA: Insufficient documentation

## 2018-07-23 MED ORDER — IOHEXOL 300 MG/ML  SOLN
100.0000 mL | Freq: Once | INTRAMUSCULAR | Status: AC | PRN
  Administered 2018-07-23: 100 mL via INTRAVENOUS

## 2018-08-19 ENCOUNTER — Encounter: Payer: Self-pay | Admitting: Pulmonary Disease

## 2018-08-19 ENCOUNTER — Ambulatory Visit (INDEPENDENT_AMBULATORY_CARE_PROVIDER_SITE_OTHER): Payer: Medicaid Other | Admitting: Pulmonary Disease

## 2018-08-19 DIAGNOSIS — J4540 Moderate persistent asthma, uncomplicated: Secondary | ICD-10-CM

## 2018-08-19 DIAGNOSIS — G4733 Obstructive sleep apnea (adult) (pediatric): Secondary | ICD-10-CM | POA: Diagnosis not present

## 2018-08-19 MED ORDER — ALBUTEROL SULFATE HFA 108 (90 BASE) MCG/ACT IN AERS: 2.0000 | INHALATION_SPRAY | RESPIRATORY_TRACT | 5 refills | Status: DC | PRN

## 2018-08-19 MED ORDER — MOMETASONE FURO-FORMOTEROL FUM 200-5 MCG/ACT IN AERO: 2.0000 | INHALATION_SPRAY | Freq: Two times a day (BID) | RESPIRATORY_TRACT | 5 refills | Status: AC

## 2018-08-19 NOTE — Assessment & Plan Note (Signed)
Reschedule split-night study High probability

## 2018-08-19 NOTE — Assessment & Plan Note (Addendum)
Refills on Dulera and albuterol. Asthma seems well controlled.  Symptoms of chest heaviness do not seem to be related to asthma.  Note normal spirometry on her last visit

## 2018-08-19 NOTE — Patient Instructions (Signed)
Refills on Dulera and albuterol. Asthma seems well controlled. Await sleep study results

## 2018-08-19 NOTE — Progress Notes (Signed)
   Subjective:    Patient ID: Crystal Myers, female    DOB: July 14, 1981, 37 y.o.   MRN: [id number]  HPI  37 year old for FU of  Asthma, dysphagia and sleep disordered breathing.    Past medical history of bipolar disorder, anxiety and ADHD and is on medications for these. She reports mild intermittent asthma symptoms starting in her 63s, triggers being URI, allergies and heat.  She underwent formal allergy testing and was found to be allergic to dust mite, was advised immuno therapy but did not keep follow-up due to the long drive.  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  She underwent ENT evaluation, biopsy showed benign lesion, resection is being referred.  She developed nausea vomiting and reflux symptoms and was on Zantac and Protonix, CT abdomen 06/2018 showed constipation and GI evaluation is pending.  She reports chest pressure and heaviness which persists all the time but is worse on exertion.  She denies associated wheezing.  She has nocturnal symptoms of reflux which wakes her up.  Sleep study had to be rescheduled due to her other doctor visits  Significant tests/ events reviewed  08/2017 eos 400  nml swallow study 05/2015  FENO 11 Spirometry 04/2018  was normal with ratio of 88, FEV1 of 95%, FVC of 88% Review of Systems neg for any significant sore throat, dysphagia, itching, sneezing, nasal congestion or excess/ purulent secretions, fever, chills, sweats, unintended wt loss, pleuritic or exertional cp, hempoptysis, orthopnea pnd or change in chronic leg swelling. Also denies presyncope, palpitations, heartburn, abdominal pain, nausea, vomiting, diarrhea or change in bowel or urinary habits, dysuria,hematuria, rash, arthralgias, visual  complaints, headache, numbness weakness or ataxia.     Objective:   Physical Exam   Gen. Pleasant, obese, in no distress ENT - no lesions, no post nasal drip Neck: No JVD, no thyromegaly, no carotid bruits Lungs: no use of accessory muscles, no dullness to percussion, decreased without rales or rhonchi  Cardiovascular: Rhythm regular, heart sounds  normal, no murmurs or gallops, no peripheral edema Musculoskeletal: No deformities, no cyanosis or clubbing , no tremors        Assessment & Plan:

## 2018-08-29 ENCOUNTER — Encounter (HOSPITAL_BASED_OUTPATIENT_CLINIC_OR_DEPARTMENT_OTHER): Payer: Medicaid Other

## 2018-10-11 ENCOUNTER — Ambulatory Visit (HOSPITAL_BASED_OUTPATIENT_CLINIC_OR_DEPARTMENT_OTHER): Payer: Medicaid Other | Attending: Pulmonary Disease

## 2018-11-18 NOTE — Progress Notes (Signed)
@Patient  ID: Crystal Myers, female    DOB: Dec 07, 1980, 37 y.o.   MRN: [id number]  Chief Complaint  Patient presents with  . Follow-up    3 month follow for asthma. Stated she has decided to not do the sleep study. Has had an asthma flare up for the past few days. Increased SOB, no fever and chest tightness.     Referring provider: Ronney Lion, NP  HPI:  37 year old female former smoker followed in our office for asthma as well as obstructive sleep apnea  PMH: ADHD, GERD, depression, migraines Smoker/ Smoking History: Former smoker.  Quit 2016. Maintenance: Dulera 200 Pt of: Dr. Vassie Loll  11/19/2018  - Visit   37 year old female former smoker presenting today for 79-month follow-up visit for asthma.  Patient reports adherence to [facility].  Patient has had to use her rescue inhaler about 12-15x times a day.  Patient has not completed split-night sleep study as she was previously scheduled for for the past 2 times patient has rescheduled or canceled due to increased anxiety regarding completing the test.  Patient reports today "I know I need to completed I will completed and I will get it rescheduled immediately".  Patient also has had acute symptoms for the last 2 to 3 weeks of increased shortness of breath, chest tightness, and bandlike chest pain.  Patient reports the shortness of breath and chest tightness is worse when laying flat, and also occasionally worse after eating.  Patient reports adherence to her Protonix.  Patient reports she actually started taking Protonix and omeprazole but she did not feel like this was helping over the past 3 days.  Patient thinks that this chest pain is related to her asthma but she also does have a cardiac disease history in her family.  Patient with BMI of 41.  Patient also reports that she feels that she has increased lower extremity swelling especially in her right lower extremity.  She says she is seeing this on her holiday pictures, and she cannot put a  shoe on her foot. When asked patient is unable to give a good timeline of symptom development she reports she cannot remember anything because she has 4 kids.   Tests:  08/2017 eos 400  nml swallow study 05/2015  Spirometry 04/2018  was normal with ratio of 88, FEV1 of 95%, FVC of 88%  FENO:  Lab Results  Component Value Date   NITRICOXIDE 11 05/13/2018    PFT: No flowsheet data found.  Imaging: No results found.    Specialty Problems      Pulmonary Problems   Moderate persistent asthma   Perennial and seasonal allergic rhinitis   OSA (obstructive sleep apnea)      Allergies  Allergen Reactions  . Ferrous Gluconate Nausea Only  . Chantix [Varenicline] Other (See Comments)    Nightmare   . Dust Mite Mixed Allergen Ext [Mite (D. Farinae)]   . Other     GRASS and POLLEN   . Oxycodone Itching  . Amoxapine Itching  . Augmentin [Amoxicillin-Pot Clavulanate] Itching    Has patient had a PCN reaction causing immediate rash, facial/tongue/throat swelling, SOB or lightheadedness with hypotension: Yes Has patient had a PCN reaction causing severe rash involving mucus membranes or skin necrosis: Yes Has patient had a PCN reaction that required hospitalization No Has patient had a PCN reaction occurring within the last 10 years: No If all of the above answers are "NO", then may proceed with Cephalosporin use.   Marland Kitchen  Penicillins Rash    Has patient had a PCN reaction causing immediate rash, facial/tongue/throat swelling, SOB or lightheadedness with hypotension: No Has patient had a PCN reaction causing severe rash involving mucus membranes or skin necrosis: No Has patient had a PCN reaction that required hospitalization No Has patient had a PCN reaction occurring within the last 10 years: No If all of the above answers are "NO", then may proceed with Cephalosporin use.     Immunization History  Administered Date(s) Administered  . Influenza-Unspecified 08/18/2018  . Tdap  04/05/2012, 02/05/2016    Past Medical History:  Diagnosis Date  . ADHD   . Anemia   . Anxiety   . Asthma   . Chlamydia   . Depression   . Headache     Tobacco History: Social History   Tobacco Use  Smoking Status Former Smoker  . Packs/day: 0.50  . Types: Cigarettes  . Last attempt to quit: 11/25/2014  . Years since quitting: 3.9  Smokeless Tobacco Never Used   Counseling given: Yes  Continue to not smoke  Outpatient Encounter Medications as of 11/19/2018  Medication Sig  . albuterol (PROAIR HFA) 108 (90 Base) MCG/ACT inhaler Inhale 2 puffs into the lungs every 4 (four) hours as needed for wheezing or shortness of breath.  . citalopram (CELEXA) 20 MG tablet Take 20 mg by mouth daily.   Marland Kitchen EPINEPHrine (EPIPEN 2-PAK) 0.3 mg/0.3 mL IJ SOAJ injection Inject 1 Dose as directed as needed (for allergic reaction).   Marland Kitchen guanFACINE (INTUNIV) 1 MG TB24 ER tablet Take 1 mg by mouth daily.  Marland Kitchen ipratropium-albuterol (DUONEB) 0.5-2.5 (3) MG/3ML SOLN Take 3 mLs by nebulization every 2 (two) hours as needed. (Patient taking differently: Take 3 mLs by nebulization every 2 (two) hours as needed (for wheezing). )  . lamoTRIgine (LAMICTAL) 150 MG tablet Take 150 mg by mouth daily.   . Levonorgestrel-Ethinyl Estradiol (CAMRESE) 0.15-0.03 &0.01 MG tablet Take 1 tablet by mouth daily.  Marland Kitchen levothyroxine (SYNTHROID, LEVOTHROID) 25 MCG tablet Take 25 mcg by mouth daily.  . mometasone-formoterol (DULERA) 200-5 MCG/ACT AERO Inhale 2 puffs into the lungs 2 (two) times daily.  . pantoprazole (PROTONIX) 20 MG tablet Take 20 mg by mouth daily.  Marland Kitchen VYVANSE 40 MG capsule Take 40 mg by mouth daily.  Marland Kitchen zolpidem (AMBIEN) 10 MG tablet Take 10 mg by mouth at bedtime.   . [DISCONTINUED] ibuprofen (ADVIL,MOTRIN) 200 MG tablet Take 600 mg by mouth every 6 (six) hours as needed for headache or moderate pain.  . [DISCONTINUED] ranitidine (ZANTAC) 150 MG capsule Take 150 mg by mouth every evening.   No  facility-administered encounter medications on file as of 11/19/2018.      Review of Systems  Review of Systems  Constitutional: Positive for fatigue. Negative for chills, fever and unexpected weight change.  HENT: Positive for congestion. Negative for ear pain, postnasal drip, sinus pressure and sinus pain.   Respiratory: Positive for chest tightness and shortness of breath. Negative for cough and wheezing.   Cardiovascular: Positive for chest pain and palpitations.  Gastrointestinal: Negative for blood in stool, diarrhea, nausea and vomiting.       Denies acid reflux-like symptoms, patient unsure if she is having epigastric pain  Musculoskeletal: Negative for arthralgias.  Skin: Negative for color change.  Allergic/Immunologic: Negative for environmental allergies and food allergies.  Neurological: Negative for dizziness, light-headedness and headaches.  Psychiatric/Behavioral: Negative for dysphoric mood. The patient is not nervous/anxious.   All other systems reviewed  and are negative.    Physical Exam  BP 118/82 (BP Location: Left Arm, Patient Position: Sitting, Cuff Size: Normal)   Pulse 77   Ht 5\' 8"  (1.727 m)   Wt 271 lb (122.9 kg)   SpO2 100%   BMI 41.21 kg/m   Wt Readings from Last 5 Encounters:  11/19/18 271 lb (122.9 kg)  08/19/18 275 lb (124.7 kg)  06/01/18 271 lb (122.9 kg)  05/13/18 270 lb 9.6 oz (122.7 kg)  04/27/18 277 lb 3.2 oz (125.7 kg)     Physical Exam  Constitutional: She is oriented to person, place, and time and well-developed, well-nourished, and in no distress. No distress.  HENT:  Head: Normocephalic and atraumatic.  Right Ear: Hearing, tympanic membrane, external ear and ear canal normal.  Left Ear: Hearing, tympanic membrane, external ear and ear canal normal.  Nose: Mucosal edema present. Right sinus exhibits no maxillary sinus tenderness and no frontal sinus tenderness. Left sinus exhibits no maxillary sinus tenderness and no frontal sinus  tenderness.  Mouth/Throat: Uvula is midline.  Eyes: Pupils are equal, round, and reactive to light.  Neck: Normal range of motion. Neck supple.  Cardiovascular: Normal rate, regular rhythm and normal heart sounds.  Pulmonary/Chest: Effort normal and breath sounds normal. No accessory muscle usage. No respiratory distress. She has no decreased breath sounds. She has no wheezes. She has no rhonchi. She has no rales.  Abdominal: Soft. Bowel sounds are normal. There is no abdominal tenderness.  Musculoskeletal: Normal range of motion.        General: Edema (Trace lower extremity edema, equal bilaterally) present.  Neurological: She is alert and oriented to person, place, and time. Gait normal.  Skin: Skin is warm and dry. She is not diaphoretic. No erythema.  Psychiatric: Mood, memory, affect and judgment normal.  + Confused when discussing symptoms timeline  Nursing note and vitals reviewed.   11/19/2018-EKG- sinus rhythm, low voltage in precordial leads >>> Normal sinus rhythm   Lab Results:  CBC    Component Value Date/Time   WBC 6.0 11/19/2018 1234   RBC 6.09 (H) 11/19/2018 1234   HGB 11.4 (L) 11/19/2018 1234   HGB 11.1 04/27/2018 1117   HCT 36.6 11/19/2018 1234   HCT 36.2 04/27/2018 1117   PLT 286.0 11/19/2018 1234   PLT 301 04/27/2018 1117   MCV 60.0 Repeated and verified X2. (L) 11/19/2018 1234   MCV 63 (L) 04/27/2018 1117   MCH 19.4 (L) 04/27/2018 1117   MCH 19.1 (L) 09/19/2017 1141   MCHC 31.1 11/19/2018 1234   RDW 15.4 11/19/2018 1234   RDW 16.8 (H) 04/27/2018 1117   LYMPHSABS 2.9 09/19/2017 1141   MONOABS 0.3 09/19/2017 1141   EOSABS 0.4 09/19/2017 1141   BASOSABS 0.1 09/19/2017 1141    BMET    Component Value Date/Time   NA 139 11/19/2018 1234   NA 140 04/27/2018 1117   K 3.7 11/19/2018 1234   CL 106 11/19/2018 1234   CO2 24 11/19/2018 1234   GLUCOSE 87 11/19/2018 1234   BUN 9 11/19/2018 1234   BUN 8 04/27/2018 1117   CREATININE 0.68 11/19/2018 1234    CALCIUM 8.6 11/19/2018 1234   GFRNONAA 84 04/27/2018 1117   GFRAA 97 04/27/2018 1117    BNP No results found for: BNP  ProBNP    Component Value Date/Time   PROBNP 41.0 11/19/2018 1234      Assessment & Plan:   Pleasant 37 year old female patient completing follow-up with our office.  [facility] and can use her rescue inhaler every 6 hours as needed.  I strongly believe the patient's chest pain is coming from her acid reflux which I do not believe is well controlled due to her nonadherence to the medication as well as taking all of her meds together.  Patient also needs to strongly adjust her diet and nutritional intake for GERD management.  Patient also was using her rescue inhaler 12-15 times a day which can contribute to her palpitations, anxiety as well as chest pain.  Patient's lung sounds are clear.  I do not believe she needs a steroid or an antibiotic or chest x-ray at this time.  EKG was normal.  Patient knows to follow-up with primary care or in the emergency room if symptoms are not improving.  Patient needs to be scheduled for split-night sleep study.  OSA (obstructive sleep apnea) Please reschedule your split-night sleep study with 1 of our patient care coordinators today  Follow with Dr Vassie LollAlva in 6-8 weeks after completing split night sleep study  Moderate persistent asthma Please reschedule your split-night sleep study with 1 of our patient care coordinators today  Continue Dulera 200 >>> 2 puffs in the morning right when you wake up, rinse out your mouth after use, 12 hours later 2 puffs, rinse after use >>> Take this daily, no matter what >>> This is not a rescue inhaler   Follow with Dr Vassie LollAlva in 6-8 weeks after completing split night sleep study  Chest pain  EKG today to evaluate your nonspecific chest pain >>>normal ekg   Labwork today    GERD (gastroesophageal reflux disease)  Protonix 20 mg tablet  >>>Please take 1 tablet daily 15  minutes to 30 minutes before your first meal of the day as well as before your other medications >>>Try to take at the same time each day >>>take this medication daily  GERD management: >>>Avoid laying flat until 2 hours after meals >>>Elevate head of the bed including entire chest >>>Reduce size of meals and amount of fat, acid, spices, caffeine and sweets >>>If you are smoking, Please stop! >>>Decrease alcohol consumption >>>Work on maintaining a healthy weight with normal BMI       Coral CeoBrian P Deronda Christian, NP 11/19/2018   This appointment was 35 min long with over 50% of the time in direct face-to-face patient care, assessment, plan of care, and follow-up.

## 2018-11-19 ENCOUNTER — Encounter: Payer: Self-pay | Admitting: Pulmonary Disease

## 2018-11-19 ENCOUNTER — Telehealth: Payer: Self-pay | Admitting: Pulmonary Disease

## 2018-11-19 ENCOUNTER — Ambulatory Visit (INDEPENDENT_AMBULATORY_CARE_PROVIDER_SITE_OTHER): Payer: Medicaid Other | Admitting: Pulmonary Disease

## 2018-11-19 VITALS — BP 118/82 | HR 77 | Ht 68.0 in | Wt 271.0 lb

## 2018-11-19 DIAGNOSIS — J4540 Moderate persistent asthma, uncomplicated: Secondary | ICD-10-CM

## 2018-11-19 DIAGNOSIS — R079 Chest pain, unspecified: Secondary | ICD-10-CM | POA: Insufficient documentation

## 2018-11-19 DIAGNOSIS — G4733 Obstructive sleep apnea (adult) (pediatric): Secondary | ICD-10-CM | POA: Diagnosis not present

## 2018-11-19 DIAGNOSIS — K219 Gastro-esophageal reflux disease without esophagitis: Secondary | ICD-10-CM | POA: Insufficient documentation

## 2018-11-19 LAB — CBC
HCT: 36.6 % (ref 36.0–46.0)
HEMOGLOBIN: 11.4 g/dL — AB (ref 12.0–15.0)
MCHC: 31.1 g/dL (ref 30.0–36.0)
MCV: 60 fl — ABNORMAL LOW (ref 78.0–100.0)
Platelets: 286 10*3/uL (ref 150.0–400.0)
RBC: 6.09 Mil/uL — AB (ref 3.87–5.11)
RDW: 15.4 % (ref 11.5–15.5)
WBC: 6 10*3/uL (ref 4.0–10.5)

## 2018-11-19 LAB — BASIC METABOLIC PANEL
BUN: 9 mg/dL (ref 6–23)
CHLORIDE: 106 meq/L (ref 96–112)
CO2: 24 meq/L (ref 19–32)
CREATININE: 0.68 mg/dL (ref 0.40–1.20)
Calcium: 8.6 mg/dL (ref 8.4–10.5)
GFR: 102.78 mL/min (ref >60.00)
Glucose, Bld: 87 mg/dL (ref 70–99)
Potassium: 3.7 mEq/L (ref 3.5–5.1)
Sodium: 139 mEq/L (ref 135–145)

## 2018-11-19 LAB — BRAIN NATRIURETIC PEPTIDE: Pro B Natriuretic peptide (BNP): 41 pg/mL (ref 0.0–100.0)

## 2018-11-19 LAB — TROPONIN I: TNIDX: 0 ug/l (ref 0.00–0.06)

## 2018-11-19 NOTE — Assessment & Plan Note (Signed)
Please reschedule your split-night sleep study with 1 of our patient care coordinators today  Follow with Dr Vassie LollAlva in 6-8 weeks after completing split night sleep study

## 2018-11-19 NOTE — Assessment & Plan Note (Signed)
Please reschedule your split-night sleep study with 1 of our patient care coordinators today  Continue Dulera 200 >>> 2 puffs in the morning right when you wake up, rinse out your mouth after use, 12 hours later 2 puffs, rinse after use >>> Take this daily, no matter what >>> This is not a rescue inhaler   Follow with Dr Vassie LollAlva in 6-8 weeks after completing split night sleep study

## 2018-11-19 NOTE — Progress Notes (Signed)
Lab work results of come back showing stable and slightly improved anemia.  No changes in plan of care at this time.  We can route lab work to patient's primary care.  Ensure the patient follows up with primary care regarding her anemia.  Elisha HeadlandBrian Faisal Stradling, FNP

## 2018-11-19 NOTE — Progress Notes (Signed)
Discussed results with patient in office.  Nothing further is needed at this time.  Sherril Shipman FNP  

## 2018-11-19 NOTE — Assessment & Plan Note (Signed)
  EKG today to evaluate your nonspecific chest pain >>>normal ekg   Labwork today

## 2018-11-19 NOTE — Patient Instructions (Addendum)
Please reschedule your split-night sleep study with 1 of our patient care coordinators today  EKG today to evaluate your nonspecific chest pain >>>normal ekg   Labwork today   Continue Dulera 200 >>> 2 puffs in the morning right when you wake up, rinse out your mouth after use, 12 hours later 2 puffs, rinse after use >>> Take this daily, no matter what >>> This is not a rescue inhaler   Protonix 20 mg tablet  >>>Please take 1 tablet daily 15 minutes to 30 minutes before your first meal of the day as well as before your other medications >>>Try to take at the same time each day >>>take this medication daily  GERD management: >>>Avoid laying flat until 2 hours after meals >>>Elevate head of the bed including entire chest >>>Reduce size of meals and amount of fat, acid, spices, caffeine and sweets >>>If you are smoking, Please stop! >>>Decrease alcohol consumption >>>Work on maintaining a healthy weight with normal BMI    Follow with Dr Vassie Loll in 6-8 weeks after completing split night sleep study    It is flu season:   >>>Remember to be washing your hands regularly, using hand sanitizer, be careful to use around herself with has contact with people who are sick will increase her chances of getting sick yourself. >>> Best ways to protect herself from the flu: Receive the yearly flu vaccine, practice good hand hygiene washing with soap and also using hand sanitizer when available, eat a nutritious meals, get adequate rest, hydrate appropriately   Please contact the office if your symptoms worsen or you have concerns that you are not improving.   Thank you for choosing [facility] for your healthcare, and for allowing [place] to partner with you on your healthcare journey. I am thankful to be able to provide care to you today.   Elisha Headland FNP-C    Gastroesophageal Reflux Disease, Adult Gastroesophageal reflux (GER) happens when acid from the stomach flows up into the tube  that connects the mouth and the stomach (esophagus). Normally, food travels down the esophagus and stays in the stomach to be digested. With GER, food and stomach acid sometimes move back up into the esophagus. You may have a disease called gastroesophageal reflux disease (GERD) if the reflux:  Happens often.  Causes frequent or very bad symptoms.  Causes problems such as damage to the esophagus. When this happens, the esophagus becomes sore and swollen (inflamed). Over time, GERD can make small holes (ulcers) in the lining of the esophagus. What are the causes? This condition is caused by a problem with the muscle between the esophagus and the stomach. When this muscle is weak or not normal, it does not close properly to keep food and acid from coming back up from the stomach. The muscle can be weak because of:  Tobacco use.  Pregnancy.  Having a certain type of hernia (hiatal hernia).  Alcohol use.  Certain foods and drinks, such as coffee, chocolate, onions, and peppermint. What increases the risk? You are more likely to develop this condition if you:  Are overweight.  Have a disease that affects your connective tissue.  Use NSAID medicines. What are the signs or symptoms? Symptoms of this condition include:  Heartburn.  Difficult or painful swallowing.  The feeling of having a lump in the throat.  A bitter taste in the mouth.  Bad breath.  Having a lot of saliva.  Having an upset or bloated stomach.  Belching.  Chest  pain. Different conditions can cause chest pain. Make sure you see your doctor if you have chest pain.  Shortness of breath or noisy breathing (wheezing).  Ongoing (chronic) cough or a cough at night.  Wearing away of the surface of teeth (tooth enamel).  Weight loss. How is this treated? Treatment will depend on how bad your symptoms are. Your doctor may suggest:  Changes to your diet.  Medicine.  Surgery. Follow these instructions at  home: Eating and drinking   Follow a diet as told by your doctor. You may need to avoid foods and drinks such as: ? Coffee and tea (with or without caffeine). ? Drinks that contain alcohol. ? Energy drinks and sports drinks. ? Bubbly (carbonated) drinks or sodas. ? Chocolate and cocoa. ? Peppermint and mint flavorings. ? Garlic and onions. ? Horseradish. ? Spicy and acidic foods. These include peppers, chili powder, curry powder, vinegar, hot sauces, and BBQ sauce. ? Citrus fruit juices and citrus fruits, such as oranges, lemons, and limes. ? Tomato-based foods. These include red sauce, chili, salsa, and pizza with red sauce. ? Fried and fatty foods. These include donuts, french fries, potato chips, and high-fat dressings. ? High-fat meats. These include hot dogs, rib eye steak, sausage, ham, and bacon. ? High-fat dairy items, such as whole milk, butter, and cream cheese.  Eat small meals often. Avoid eating large meals.  Avoid drinking large amounts of liquid with your meals.  Avoid eating meals during the 2-3 hours before bedtime.  Avoid lying down right after you eat.  Do not exercise right after you eat. Lifestyle   Do not use any products that contain nicotine or tobacco. These include cigarettes, e-cigarettes, and chewing tobacco. If you need help quitting, ask your doctor.  Try to lower your stress. If you need help doing this, ask your doctor.  If you are overweight, lose an amount of weight that is healthy for you. Ask your doctor about a safe weight loss goal. General instructions  Pay attention to any changes in your symptoms.  Take over-the-counter and prescription medicines only as told by your doctor. Do not take aspirin, ibuprofen, or other NSAIDs unless your doctor says it is okay.  Wear loose clothes. Do not wear anything tight around your waist.  Raise (elevate) the head of your bed about 6 inches (15 cm).  Avoid bending over if this makes your  symptoms worse.  Keep all follow-up visits as told by your doctor. This is important. Contact a doctor if:  You have new symptoms.  You lose weight and you do not know why.  You have trouble swallowing or it hurts to swallow.  You have wheezing or a cough that keeps happening.  Your symptoms do not get better with treatment.  You have a hoarse voice. Get help right away if:  You have pain in your arms, neck, jaw, teeth, or back.  You feel sweaty, dizzy, or light-headed.  You have chest pain or shortness of breath.  You throw up (vomit) and your throw-up looks like blood or coffee grounds.  You pass out (faint).  Your poop (stool) is bloody or black.  You cannot swallow, drink, or eat. Summary  If a person has gastroesophageal reflux disease (GERD), food and stomach acid move back up into the esophagus and cause symptoms or problems such as damage to the esophagus.  Treatment will depend on how bad your symptoms are.  Follow a diet as told by your doctor.  Take all medicines only as told by your doctor. This information is not intended to replace advice given to you by your health care provider. Make sure you discuss any questions you have with your health care provider. Document Released: 04/29/2008 Document Revised: 05/20/2018 Document Reviewed: 05/20/2018 Elsevier Interactive Patient Education  2019 [employer]    Food Choices for Gastroesophageal Reflux Disease, Adult When you have gastroesophageal reflux disease (GERD), the foods you eat and your eating habits are very important. Choosing the right foods can help ease your discomfort. Think about working with a nutrition specialist (dietitian) to help you make good choices. What are tips for following this plan?  Meals  Choose healthy foods that are low in fat, such as fruits, vegetables, whole grains, low-fat dairy products, and lean meat, fish, and poultry.  Eat small meals often instead of 3 large meals  a day. Eat your meals slowly, and in a place where you are relaxed. Avoid bending over or lying down until 2-3 hours after eating.  Avoid eating meals 2-3 hours before bed.  Avoid drinking a lot of liquid with meals.  Cook foods using methods other than frying. Bake, grill, or broil food instead.  Avoid or limit: ? Chocolate. ? Peppermint or spearmint. ? Alcohol. ? Pepper. ? Black and decaffeinated coffee. ? Black and decaffeinated tea. ? Bubbly (carbonated) soft drinks. ? Caffeinated energy drinks and soft drinks.  Limit high-fat foods such as: ? Fatty meat or fried foods. ? Whole milk, cream, butter, or ice cream. ? Nuts and nut butters. ? Pastries, donuts, and sweets made with butter or shortening.  Avoid foods that cause symptoms. These foods may be different for everyone. Common foods that cause symptoms include: ? Tomatoes. ? Oranges, lemons, and limes. ? Peppers. ? Spicy food. ? Onions and garlic. ? Vinegar. Lifestyle  Maintain a healthy weight. Ask your doctor what weight is healthy for you. If you need to lose weight, work with your doctor to do so safely.  Exercise for at least 30 minutes for 5 or more days each week, or as told by your doctor.  Wear loose-fitting clothes.  Do not smoke. If you need help quitting, ask your doctor.  Sleep with the head of your bed higher than your feet. Use a wedge under the mattress or blocks under the bed frame to raise the head of the bed. Summary  When you have gastroesophageal reflux disease (GERD), food and lifestyle choices are very important in easing your symptoms.  Eat small meals often instead of 3 large meals a day. Eat your meals slowly, and in a place where you are relaxed.  Limit high-fat foods such as fatty meat or fried foods.  Avoid bending over or lying down until 2-3 hours after eating.  Avoid peppermint and spearmint, caffeine, alcohol, and chocolate. This information is not intended to replace advice  given to you by your health care provider. Make sure you discuss any questions you have with your health care provider. Document Released: 05/12/2012 Document Revised: 12/17/2016 Document Reviewed: 12/17/2016 Elsevier Interactive Patient Education  2019 [employer]

## 2018-11-19 NOTE — Telephone Encounter (Signed)
Call made to patient, made aware of her labs per NP B Mack. Voiced understanding. While on phone patient states she continues to have chest pain that is getting worse. I spoek with NP B Mack, verbal order given to have patient get a STAT CXR or got to ED. Patient states she will go to ED. Voiced understanding. Nothing further is needed at this time.

## 2018-11-19 NOTE — Telephone Encounter (Signed)
Patient has r/s & "no showed" twice for a split night sleep study.  Rescheduled this appt for 01/03/19, the sleep center's next availalbe appointment.  LM on pt's VM notifying her of the appointment & mailed packet.

## 2018-11-19 NOTE — Assessment & Plan Note (Signed)
Protonix 20 mg tablet  >>>Please take 1 tablet daily 15 minutes to 30 minutes before your first meal of the day as well as before your other medications >>>Try to take at the same time each day >>>take this medication daily  GERD management: >>>Avoid laying flat until 2 hours after meals >>>Elevate head of the bed including entire chest >>>Reduce size of meals and amount of fat, acid, spices, caffeine and sweets >>>If you are smoking, Please stop! >>>Decrease alcohol consumption >>>Work on maintaining a healthy weight with normal BMI     

## 2018-11-20 ENCOUNTER — Emergency Department (HOSPITAL_COMMUNITY): Payer: Medicaid Other

## 2018-11-20 ENCOUNTER — Telehealth: Payer: Self-pay | Admitting: Pulmonary Disease

## 2018-11-20 ENCOUNTER — Encounter (HOSPITAL_COMMUNITY): Payer: Self-pay | Admitting: *Deleted

## 2018-11-20 ENCOUNTER — Other Ambulatory Visit: Payer: Self-pay

## 2018-11-20 ENCOUNTER — Inpatient Hospital Stay (HOSPITAL_COMMUNITY)
Admission: EM | Admit: 2018-11-20 | Discharge: 2018-11-22 | DRG: 311 | Disposition: A | Discharge status: Home / Self Care | Payer: Medicaid Other | Attending: Cardiovascular Disease | Admitting: Cardiovascular Disease

## 2018-11-20 DIAGNOSIS — R0789 Other chest pain: Secondary | ICD-10-CM

## 2018-11-20 DIAGNOSIS — J4540 Moderate persistent asthma, uncomplicated: Secondary | ICD-10-CM | POA: Diagnosis present

## 2018-11-20 DIAGNOSIS — Z6841 Body Mass Index (BMI) 40.0 and over, adult: Secondary | ICD-10-CM

## 2018-11-20 DIAGNOSIS — I249 Acute ischemic heart disease, unspecified: Principal | ICD-10-CM | POA: Diagnosis present

## 2018-11-20 DIAGNOSIS — G4733 Obstructive sleep apnea (adult) (pediatric): Secondary | ICD-10-CM | POA: Diagnosis present

## 2018-11-20 DIAGNOSIS — I493 Ventricular premature depolarization: Secondary | ICD-10-CM | POA: Diagnosis present

## 2018-11-20 DIAGNOSIS — Z8249 Family history of ischemic heart disease and other diseases of the circulatory system: Secondary | ICD-10-CM

## 2018-11-20 DIAGNOSIS — E039 Hypothyroidism, unspecified: Secondary | ICD-10-CM | POA: Diagnosis present

## 2018-11-20 DIAGNOSIS — D509 Iron deficiency anemia, unspecified: Secondary | ICD-10-CM | POA: Diagnosis present

## 2018-11-20 DIAGNOSIS — E785 Hyperlipidemia, unspecified: Secondary | ICD-10-CM | POA: Diagnosis present

## 2018-11-20 DIAGNOSIS — F909 Attention-deficit hyperactivity disorder, unspecified type: Secondary | ICD-10-CM | POA: Diagnosis present

## 2018-11-20 DIAGNOSIS — G43909 Migraine, unspecified, not intractable, without status migrainosus: Secondary | ICD-10-CM | POA: Diagnosis present

## 2018-11-20 DIAGNOSIS — Z881 Allergy status to other antibiotic agents status: Secondary | ICD-10-CM

## 2018-11-20 DIAGNOSIS — K219 Gastro-esophageal reflux disease without esophagitis: Secondary | ICD-10-CM | POA: Diagnosis present

## 2018-11-20 DIAGNOSIS — F329 Major depressive disorder, single episode, unspecified: Secondary | ICD-10-CM | POA: Diagnosis present

## 2018-11-20 DIAGNOSIS — Z888 Allergy status to other drugs, medicaments and biological substances status: Secondary | ICD-10-CM

## 2018-11-20 DIAGNOSIS — Z880 Allergy status to penicillin: Secondary | ICD-10-CM

## 2018-11-20 DIAGNOSIS — D563 Thalassemia minor: Secondary | ICD-10-CM | POA: Diagnosis present

## 2018-11-20 DIAGNOSIS — Z833 Family history of diabetes mellitus: Secondary | ICD-10-CM

## 2018-11-20 DIAGNOSIS — Z87891 Personal history of nicotine dependence: Secondary | ICD-10-CM

## 2018-11-20 DIAGNOSIS — Z825 Family history of asthma and other chronic lower respiratory diseases: Secondary | ICD-10-CM

## 2018-11-20 DIAGNOSIS — Z9049 Acquired absence of other specified parts of digestive tract: Secondary | ICD-10-CM

## 2018-11-20 DIAGNOSIS — I499 Cardiac arrhythmia, unspecified: Secondary | ICD-10-CM

## 2018-11-20 DIAGNOSIS — F419 Anxiety disorder, unspecified: Secondary | ICD-10-CM | POA: Diagnosis present

## 2018-11-20 LAB — BASIC METABOLIC PANEL
ANION GAP: 11 (ref 5–15)
BUN: 10 mg/dL (ref 6–20)
CO2: 22 mmol/L (ref 22–32)
CREATININE: 0.73 mg/dL (ref 0.44–1.00)
Calcium: 9.2 mg/dL (ref 8.9–10.3)
Chloride: 106 mmol/L (ref 98–111)
GFR calc non Af Amer: >60 mL/min (ref >60)
Glucose, Bld: 120 mg/dL — ABNORMAL HIGH (ref 70–99)
Potassium: 3.4 mmol/L — ABNORMAL LOW (ref 3.5–5.1)
Sodium: 139 mmol/L (ref 135–145)

## 2018-11-20 LAB — CBC
HEMATOCRIT: 40.2 % (ref 36.0–46.0)
HEMOGLOBIN: 11.8 g/dL — AB (ref 12.0–15.0)
MCH: 18.9 pg — ABNORMAL LOW (ref 26.0–34.0)
MCHC: 29.4 g/dL — ABNORMAL LOW (ref 30.0–36.0)
MCV: 64.4 fL — AB (ref 80.0–100.0)
NRBC: 0 % (ref 0.0–0.2)
Platelets: 305 10*3/uL (ref 150–400)
RBC: 6.24 MIL/uL — AB (ref 3.87–5.11)
RDW: 17.6 % — ABNORMAL HIGH (ref 11.5–15.5)
WBC: 8.1 10*3/uL (ref 4.0–10.5)

## 2018-11-20 LAB — POCT I-STAT TROPONIN I: Troponin i, poc: 0 ng/mL (ref 0.00–0.08)

## 2018-11-20 LAB — I-STAT BETA HCG BLOOD, ED (NOT ORDERABLE)

## 2018-11-20 MED ORDER — IOPAMIDOL (ISOVUE-370) INJECTION 76%
INTRAVENOUS | Status: AC
  Filled 2018-11-20: qty 100

## 2018-11-20 MED ORDER — SODIUM CHLORIDE (PF) 0.9 % IJ SOLN
INTRAMUSCULAR | Status: AC
  Filled 2018-11-20: qty 50

## 2018-11-20 MED ORDER — IOPAMIDOL (ISOVUE-370) INJECTION 76%
100.0000 mL | Freq: Once | INTRAVENOUS | Status: AC | PRN
  Administered 2018-11-20: 100 mL via INTRAVENOUS

## 2018-11-20 MED ORDER — SODIUM CHLORIDE 0.9 % IV BOLUS
1000.0000 mL | Freq: Once | INTRAVENOUS | Status: AC
  Administered 2018-11-20: 1000 mL via INTRAVENOUS

## 2018-11-20 NOTE — Telephone Encounter (Signed)
Called and spoke with patient, she is aware of results and verbalized understanding. Nothing further needed.  

## 2018-11-20 NOTE — ED Triage Notes (Signed)
Pt reports L upper chest pressure and heaviness x 1 week, which is non-radiating.  Pt reports mild SOB but denies any dizziness.  She has asthma and have been coughing at night.  Mostly non-productive cough.  She has used her albuterol inhaler x 15-16 times a day without relief.

## 2018-11-20 NOTE — ED Notes (Signed)
Patient transported to X-ray 

## 2018-11-20 NOTE — ED Provider Notes (Signed)
[facility]-EMERGENCY DEPT Provider Note   CSN: [id number] Arrival date & time: 11/20/18  1910     History   Chief Complaint Chief Complaint  Patient presents with  . Chest Pain    HPI Breckin M Ladona Ridgel is a 37 y.o. female.  The history is provided by the patient.     37 year old female with past medical history as below including asthma here with chest pain and shortness of breath.  The patient states that over the last several weeks, she has noticed progressively worsening left-sided chest pain.  The pain is primarily sharp, stabbing, and worse with movement and exertion.  The pain is associated with increased shortness of breath.  She thought it was due to her asthma and has been taking her inhalers frequently, without significant relief.  She was seen at her pulmonologist yesterday, and told to come to the ED but due to family issues, she was unable to do so.  Of note, she also notes increased swelling of her right lower extremity over the last several weeks, which preceded her current chest pain.  She is on birth control pills.  Denies recent immobilization or trauma.  She does endorse a family history of blood clots.  She has had mostly dry cough, but occasional sputum production.  No hemoptysis.  No history of coronary disease.  She has not noticed any significant alleviating factors.  Past Medical History:  Diagnosis Date  . ADHD   . Anemia   . Anxiety   . Asthma   . Chlamydia   . Depression   . Headache     Patient Active Problem List   Diagnosis Date Noted  . Acute coronary syndrome (HCC) 11/21/2018  . Chest pain 11/19/2018  . GERD (gastroesophageal reflux disease) 11/19/2018  . OSA (obstructive sleep apnea) 05/13/2018  . Dysphagia 05/13/2018  . Perennial and seasonal allergic rhinitis 07/08/2016  . Allergic conjunctivitis 07/08/2016  . Moderate persistent asthma 07/08/2016  . Generalized pruritus 07/08/2016  . Depression 12/24/2014  . Acute  calculous cholecystitis s/p lap chole 12/24/2014 12/23/2014  . Migraine 02/11/2013    Past Surgical History:  Procedure Laterality Date  . ADENOIDECTOMY    . APPENDECTOMY    . CERVICAL BIOPSY  W/ LOOP ELECTRODE EXCISION    . CERVICAL POLYPECTOMY    . CESAREAN SECTION    . CESAREAN SECTION     x4  . CHOLECYSTECTOMY N/A 12/24/2014   Procedure: LAPAROSCOPIC CHOLECYSTECTOMY WITH INTRAOPERATIVE CHOLANGIOGRAM;  Surgeon: Glenna Fellows, MD;  Location: [facility]S;  Service: General;  Laterality: N/A;  . DILATION AND CURETTAGE OF UTERUS    . LYMPHADENECTOMY    . TONSILLECTOMY    . TUBAL LIGATION       OB History    Gravida  5   Para  5   Term  3   Preterm  2   AB      Living  3     SAB      TAB      Ectopic      Multiple      Live Births  4            Home Medications    Prior to Admission medications   Medication Sig Start Date End Date Taking? Authorizing Provider  albuterol (PROAIR HFA) 108 (90 Base) MCG/ACT inhaler Inhale 2 puffs into the lungs every 4 (four) hours as needed for wheezing or shortness of breath. 08/19/18  Yes Oretha Milch,  MD  citalopram (CELEXA) 20 MG tablet Take 20 mg by mouth daily.    Yes [provider]  EPINEPHrine (EPIPEN 2-PAK) 0.3 mg/0.3 mL IJ SOAJ injection Inject 1 Dose as directed as needed (for allergic reaction).  08/19/14  Yes [provider]  guanFACINE (INTUNIV) 1 MG TB24 ER tablet Take 1 mg by mouth daily. 05/13/18  Yes [provider]  ipratropium-albuterol (DUONEB) 0.5-2.5 (3) MG/3ML SOLN Take 3 mLs by nebulization every 2 (two) hours as needed. Patient taking differently: Take 3 mLs by nebulization every 2 (two) hours as needed (for wheezing).  10/03/15  Yes Barrett, Rolm Gala, PA-C  lamoTRIgine (LAMICTAL) 150 MG tablet Take 150 mg by mouth daily.    Yes [provider]  Levonorgestrel-Ethinyl Estradiol (CAMRESE) 0.15-0.03 &0.01 MG tablet Take 1 tablet by mouth daily. 05/14/18  Yes Constant, Peggy,  MD  levothyroxine (SYNTHROID, LEVOTHROID) 25 MCG tablet Take 25 mcg by mouth daily. 04/30/18  Yes [provider]  mometasone-formoterol (DULERA) 200-5 MCG/ACT AERO Inhale 2 puffs into the lungs 2 (two) times daily. 08/19/18  Yes Oretha Milch, MD  pantoprazole (PROTONIX) 20 MG tablet Take 20 mg by mouth daily.   Yes [provider]  VYVANSE 40 MG capsule Take 40 mg by mouth daily. 09/01/17  Yes [provider]  zolpidem (AMBIEN) 10 MG tablet Take 10 mg by mouth at bedtime.    Yes [provider]    Family History Family History  Problem Relation Age of Onset  . Diabetes Mother   . Hypertension Mother   . CAD Mother   . Allergic rhinitis Mother   . Asthma Sister     Social History Social History   Tobacco Use  . Smoking status: Former Smoker    Packs/day: 0.50    Types: Cigarettes    Last attempt to quit: 11/25/2014    Years since quitting: 3.9  . Smokeless tobacco: Never Used  Substance Use Topics  . Alcohol use: No    Alcohol/week: 0.0 standard drinks    Comment: former  . Drug use: No     Allergies   Ferrous gluconate; Chantix [varenicline]; Dust mite mixed allergen ext [mite (d. farinae)]; Other; Oxycodone; Amoxapine; Augmentin [amoxicillin-pot clavulanate]; and Penicillins   Review of Systems Review of Systems  Constitutional: Positive for fatigue. Negative for chills and fever.  HENT: Negative for congestion and rhinorrhea.   Eyes: Negative for visual disturbance.  Respiratory: Positive for cough, chest tightness and shortness of breath. Negative for wheezing.   Cardiovascular: Positive for chest pain. Negative for leg swelling.  Gastrointestinal: Negative for abdominal pain, diarrhea, nausea and vomiting.  Genitourinary: Negative for dysuria and flank pain.  Musculoskeletal: Negative for neck pain and neck stiffness.  Skin: Negative for rash and wound.  Allergic/Immunologic: Negative for immunocompromised state.  Neurological:  Negative for syncope, weakness and headaches.  All other systems reviewed and are negative.    Physical Exam Updated Vital Signs BP (!) 129/91 (BP Location: Right Arm)   Pulse 75   Temp 97.6 F (36.4 C) (Oral)   Resp 13   Ht 5\' 8"  (1.727 m)   Wt 122.9 kg   SpO2 98%   BMI 41.21 kg/m   Physical Exam Vitals signs and nursing note reviewed.  Constitutional:      General: She is not in acute distress.    Appearance: She is well-developed.  HENT:     Head: Normocephalic and atraumatic.  Eyes:     Conjunctiva/sclera: Conjunctivae  normal.  Neck:     Musculoskeletal: Neck supple.  Cardiovascular:     Rate and Rhythm: Tachycardia present. Rhythm irregular.     Heart sounds: Normal heart sounds. No murmur. No friction rub.  Pulmonary:     Effort: Pulmonary effort is normal. Tachypnea present. No respiratory distress.     Breath sounds: Normal breath sounds. No wheezing or rales.  Abdominal:     General: There is no distension.     Palpations: Abdomen is soft.     Tenderness: There is no abdominal tenderness.  Skin:    General: Skin is warm.     Capillary Refill: Capillary refill takes less than 2 seconds.  Neurological:     Mental Status: She is alert and oriented to person, place, and time.     Motor: No abnormal muscle tone.      ED Treatments / Results  Labs (all labs ordered are listed, but only abnormal results are displayed) Labs Reviewed  BASIC METABOLIC PANEL - Abnormal; Notable for the following components:      Result Value   Potassium 3.4 (*)    Glucose, Bld 120 (*)    All other components within normal limits  CBC - Abnormal; Notable for the following components:   RBC 6.24 (*)    Hemoglobin 11.8 (*)    MCV 64.4 (*)    MCH 18.9 (*)    MCHC 29.4 (*)    RDW 17.6 (*)    All other components within normal limits  TSH - Abnormal; Notable for the following components:   TSH 4.639 (*)    All other components within normal limits  T4, FREE - Abnormal;  Notable for the following components:   Free T4 0.80 (*)    All other components within normal limits  BASIC METABOLIC PANEL - Abnormal; Notable for the following components:   CO2 20 (*)    Glucose, Bld 117 (*)    Calcium 8.5 (*)    All other components within normal limits  CBC - Abnormal; Notable for the following components:   RBC 5.50 (*)    Hemoglobin 10.5 (*)    HCT 35.5 (*)    MCV 64.5 (*)    MCH 19.1 (*)    MCHC 29.6 (*)    RDW 16.5 (*)    All other components within normal limits  APTT - Abnormal; Notable for the following components:   aPTT 75 (*)    All other components within normal limits  LIPID PANEL - Abnormal; Notable for the following components:   Cholesterol 204 (*)    LDL Cholesterol 122 (*)    All other components within normal limits  HEPARIN LEVEL (UNFRACTIONATED) - Abnormal; Notable for the following components:   Heparin Unfractionated <0.10 (*)    All other components within normal limits  MRSA PCR SCREENING  MAGNESIUM  HEMOGLOBIN A1C  PROTIME-INR  TROPONIN I  CBC  IRON AND TIBC  FERRITIN  CBC  I-STAT TROPONIN, ED  I-STAT BETA HCG BLOOD, ED ([facility], [facility], [facility] ONLY)  POCT I-STAT TROPONIN I  I-STAT BETA HCG BLOOD, ED (NOT ORDERABLE)    EKG EKG Interpretation  Date/Time:  Friday November 20 2018 23:44:56 EST Ventricular Rate:  95 PR Interval:    QRS Duration: 103 QT Interval:  385 QTC Calculation: 484 R Axis:   25 Text Interpretation:  Sinus rhythm Multiform ventricular premature complexes Low voltage, precordial leads Since last EKG, no significant change Confirmed by Shaune PollackIsaacs, Zakar Brosch [phone])  on 11/21/2018 12:17:55 AM   Radiology Dg Chest 2 View  Result Date: 11/20/2018 CLINICAL DATA:  Chest pain, shortness of breath. EXAM: CHEST - 2 VIEW COMPARISON:  Radiographs of October 23, 2016. FINDINGS: The heart size and mediastinal contours are within normal limits. Both lungs are clear. No pneumothorax or pleural effusion is noted. The visualized  skeletal structures are unremarkable. IMPRESSION: No active cardiopulmonary disease. Electronically Signed   By: Lupita Raider, M.D.   On: 11/20/2018 19:50   Ct Angio Chest Pe W And/or Wo Contrast  Result Date: 11/21/2018 CLINICAL DATA:  Sharp chest pain and dyspnea for 2 weeks. EXAM: CT ANGIOGRAPHY CHEST WITH CONTRAST TECHNIQUE: Multidetector CT imaging of the chest was performed using the standard protocol during bolus administration of intravenous contrast. Multiplanar CT image reconstructions and MIPs were obtained to evaluate the vascular anatomy. CONTRAST:  ISOVUE-370 IOPAMIDOL (ISOVUE-370) INJECTION 76% COMPARISON:  June 03, 2015 FINDINGS: Cardiovascular: Satisfactory opacification of the pulmonary arteries to the segmental level. No evidence of pulmonary embolism. Normal heart size. No pericardial effusion. Mediastinum/Nodes: No enlarged mediastinal, hilar, or axillary lymph nodes. Thyroid gland, trachea, and esophagus demonstrate no significant findings. Lungs/Pleura: Lungs are clear. No pleural effusion or pneumothorax. Upper Abdomen: No acute abnormality. Patient status post prior cholecystectomy. Minimal hiatal hernia is noted. Musculoskeletal: No chest wall abnormality. No acute or significant osseous findings. Review of the MIP images confirms the above findings. IMPRESSION: No pulmonary embolus. No acute abnormality identified in the chest. Electronically Signed   By: Sherian Rein M.D.   On: 11/21/2018 00:20   Nm Myocar Multi W/spect W/wall Motion / Ef  Result Date: 11/21/2018 CLINICAL DATA:  37 year old who presented yesterday with an approximate 2 week history of sharp LEFT-sided chest pain associated with shortness of breath. Abnormal EKG demonstrating sinus rhythm with frequent PVCs. Negative troponins. EXAM: MYOCARDIAL IMAGING WITH SPECT (REST AND PHARMACOLOGIC-STRESS) GATED LEFT VENTRICULAR WALL MOTION STUDY LEFT VENTRICULAR EJECTION FRACTION TECHNIQUE: Standard myocardial  SPECT imaging was performed after resting intravenous injection of 10 mCi Tc-33m tetrofosmin. Subsequently, intravenous infusion of Lexiscan was performed under the supervision of the Cardiology staff. At peak effect of the drug, 30 mCi Tc-54m tetrofosmin was injected intravenously and standard myocardial SPECT imaging was performed. Quantitative gated imaging was also performed to evaluate left ventricular wall motion, and estimate left ventricular ejection fraction. COMPARISON:  None. FINDINGS: Perfusion: No focal perfusion defects at rest or after Lexiscan infusion. Wall Motion: Normal left ventricular wall motion. No left ventricular dilation. Left Ventricular Ejection Fraction: 54 % End diastolic volume 117 ml End systolic volume 55 ml IMPRESSION: 1. No reversible ischemia or infarction. 2. Normal left ventricular wall motion. 3. Left ventricular ejection fraction 54% 4. Non invasive risk stratification*: Low. *2012 Appropriate Use Criteria for Coronary Revascularization Focused Update: J Am Coll Cardiol. 2012;59(9):857-881. http://content.dementiazones.com.aspx?articleid=1201161. Electronically Signed   By: Hulan Saas M.D.   On: 11/21/2018 11:41    Procedures Procedures (including critical care time)  Medications Ordered in ED Medications  sodium chloride (PF) 0.9 % injection (has no administration in time range)  iopamidol (ISOVUE-370) 76 % injection (has no administration in time range)  aspirin EC tablet 81 mg (has no administration in time range)  nitroGLYCERIN (NITROSTAT) SL tablet 0.4 mg (has no administration in time range)  acetaminophen (TYLENOL) tablet 650 mg (has no administration in time range)  ondansetron (ZOFRAN) injection 4 mg (has no administration in time range)  atorvastatin (LIPITOR) tablet 40 mg (40 mg Oral Given 11/21/18  1752)  citalopram (CELEXA) tablet 20 mg (20 mg Oral Given 11/21/18 1456)  guanFACINE (INTUNIV) ER tablet 1 mg (1 mg Oral Given 11/21/18 1457)    ipratropium-albuterol (DUONEB) 0.5-2.5 (3) MG/3ML nebulizer solution 3 mL (has no administration in time range)  lamoTRIgine (LAMICTAL) tablet 150 mg (150 mg Oral Given 11/21/18 1457)  Levonorgestrel-Ethinyl Estradiol (AMETHIA,CAMRESE) 0.15-0.03 &0.01 MG tablet 1 tablet (1 tablet Oral Not Given 11/21/18 1514)  levothyroxine (SYNTHROID, LEVOTHROID) tablet 25 mcg (25 mcg Oral Given 11/21/18 1455)  pantoprazole (PROTONIX) EC tablet 20 mg (20 mg Oral Given 11/21/18 1456)  lisdexamfetamine (VYVANSE) capsule 40 mg (40 mg Oral Not Given 11/21/18 1514)  albuterol (PROVENTIL) (2.5 MG/3ML) 0.083% nebulizer solution 2.5 mg (has no administration in time range)  zolpidem (AMBIEN) tablet 5 mg (has no administration in time range)  mometasone-formoterol (DULERA) 200-5 MCG/ACT inhaler 2 puff (has no administration in time range)  sodium chloride 0.9 % bolus 1,000 mL (0 mLs Intravenous Stopped 11/21/18 0049)  iopamidol (ISOVUE-370) 76 % injection 100 mL (100 mLs Intravenous Contrast Given 11/20/18 2356)  morphine 4 MG/ML injection 4 mg (4 mg Intravenous Given 11/21/18 0058)  ketorolac (TORADOL) 15 MG/ML injection 15 mg (15 mg Intravenous Given 11/21/18 0056)  potassium chloride SA (K-DUR,KLOR-CON) CR tablet 40 mEq (40 mEq Oral Given 11/21/18 0155)  aspirin chewable tablet 324 mg (324 mg Oral Given 11/21/18 0351)    Or  aspirin suppository 300 mg ( Rectal See Alternative 11/21/18 0351)  heparin bolus via infusion 4,000 Units (4,000 Units Intravenous Bolus from Bag 11/21/18 0358)  regadenoson (LEXISCAN) injection SOLN 0.4 mg (0.4 mg Intravenous Given by Other 11/21/18 0943)     Initial Impression / Assessment and Plan / ED Course  I have reviewed the triage vital signs and the nursing notes.  Pertinent labs & imaging results that were available during my care of the patient were reviewed by me and considered in my medical decision making (see chart for details).    37 yo F here w/ intermittent chest  pain, SOB. On telemetry, pt noted to have very frequent PVCs (up to 20/min) with occasional bigeminy/trigeminy. Pt reportedly has a family h/o arrhythmias, unknown etiology. CT Angio neg, no signs of acute ischemic pathology or PE. Discussed with Dr. Algie CofferKadakia, will admit.  Final Clinical Impressions(s) / ED Diagnoses   Final diagnoses:  Atypical chest pain  Frequent PVCs  Cardiac arrhythmia, unspecified cardiac arrhythmia type    ED Discharge Orders    None       Shaune PollackIsaacs, Leonia Heatherly, MD 11/21/18 1850

## 2018-11-21 ENCOUNTER — Ambulatory Visit (HOSPITAL_COMMUNITY)
Admission: RE | Admit: 2018-11-21 | Discharge: 2018-11-21 | Disposition: A | Discharge status: Home / Self Care | Payer: Medicaid Other | Source: Ambulatory Visit | Attending: Cardiovascular Disease | Admitting: Cardiovascular Disease

## 2018-11-21 DIAGNOSIS — Z6841 Body Mass Index (BMI) 40.0 and over, adult: Secondary | ICD-10-CM | POA: Diagnosis not present

## 2018-11-21 DIAGNOSIS — K219 Gastro-esophageal reflux disease without esophagitis: Secondary | ICD-10-CM | POA: Diagnosis present

## 2018-11-21 DIAGNOSIS — Z833 Family history of diabetes mellitus: Secondary | ICD-10-CM | POA: Diagnosis not present

## 2018-11-21 DIAGNOSIS — Z880 Allergy status to penicillin: Secondary | ICD-10-CM | POA: Diagnosis not present

## 2018-11-21 DIAGNOSIS — G43909 Migraine, unspecified, not intractable, without status migrainosus: Secondary | ICD-10-CM | POA: Diagnosis not present

## 2018-11-21 DIAGNOSIS — I249 Acute ischemic heart disease, unspecified: Secondary | ICD-10-CM | POA: Diagnosis present

## 2018-11-21 DIAGNOSIS — D563 Thalassemia minor: Secondary | ICD-10-CM | POA: Diagnosis not present

## 2018-11-21 DIAGNOSIS — E039 Hypothyroidism, unspecified: Secondary | ICD-10-CM | POA: Diagnosis not present

## 2018-11-21 DIAGNOSIS — F419 Anxiety disorder, unspecified: Secondary | ICD-10-CM | POA: Diagnosis not present

## 2018-11-21 DIAGNOSIS — D509 Iron deficiency anemia, unspecified: Secondary | ICD-10-CM | POA: Diagnosis not present

## 2018-11-21 DIAGNOSIS — Z881 Allergy status to other antibiotic agents status: Secondary | ICD-10-CM | POA: Diagnosis not present

## 2018-11-21 DIAGNOSIS — E785 Hyperlipidemia, unspecified: Secondary | ICD-10-CM | POA: Diagnosis not present

## 2018-11-21 DIAGNOSIS — R0602 Shortness of breath: Secondary | ICD-10-CM | POA: Insufficient documentation

## 2018-11-21 DIAGNOSIS — R0789 Other chest pain: Secondary | ICD-10-CM | POA: Insufficient documentation

## 2018-11-21 DIAGNOSIS — Z888 Allergy status to other drugs, medicaments and biological substances status: Secondary | ICD-10-CM | POA: Diagnosis not present

## 2018-11-21 DIAGNOSIS — Z825 Family history of asthma and other chronic lower respiratory diseases: Secondary | ICD-10-CM | POA: Diagnosis not present

## 2018-11-21 DIAGNOSIS — J4540 Moderate persistent asthma, uncomplicated: Secondary | ICD-10-CM | POA: Diagnosis present

## 2018-11-21 DIAGNOSIS — R079 Chest pain, unspecified: Secondary | ICD-10-CM | POA: Diagnosis not present

## 2018-11-21 DIAGNOSIS — Z87891 Personal history of nicotine dependence: Secondary | ICD-10-CM | POA: Diagnosis not present

## 2018-11-21 DIAGNOSIS — F909 Attention-deficit hyperactivity disorder, unspecified type: Secondary | ICD-10-CM | POA: Diagnosis present

## 2018-11-21 DIAGNOSIS — Z8249 Family history of ischemic heart disease and other diseases of the circulatory system: Secondary | ICD-10-CM | POA: Diagnosis not present

## 2018-11-21 DIAGNOSIS — G4733 Obstructive sleep apnea (adult) (pediatric): Secondary | ICD-10-CM | POA: Diagnosis present

## 2018-11-21 DIAGNOSIS — Z9049 Acquired absence of other specified parts of digestive tract: Secondary | ICD-10-CM | POA: Diagnosis not present

## 2018-11-21 DIAGNOSIS — F329 Major depressive disorder, single episode, unspecified: Secondary | ICD-10-CM | POA: Diagnosis not present

## 2018-11-21 DIAGNOSIS — I493 Ventricular premature depolarization: Secondary | ICD-10-CM | POA: Diagnosis not present

## 2018-11-21 LAB — BASIC METABOLIC PANEL
ANION GAP: 12 (ref 5–15)
BUN: 8 mg/dL (ref 6–20)
CALCIUM: 8.5 mg/dL — AB (ref 8.9–10.3)
CO2: 20 mmol/L — ABNORMAL LOW (ref 22–32)
Chloride: 107 mmol/L (ref 98–111)
Creatinine, Ser: 0.68 mg/dL (ref 0.44–1.00)
GFR calc Af Amer: >60 mL/min (ref >60)
GFR calc non Af Amer: >60 mL/min (ref >60)
Glucose, Bld: 117 mg/dL — ABNORMAL HIGH (ref 70–99)
Potassium: 3.7 mmol/L (ref 3.5–5.1)
Sodium: 139 mmol/L (ref 135–145)

## 2018-11-21 LAB — CBC
HCT: 35.5 % — ABNORMAL LOW (ref 36.0–46.0)
Hemoglobin: 10.5 g/dL — ABNORMAL LOW (ref 12.0–15.0)
MCH: 19.1 pg — ABNORMAL LOW (ref 26.0–34.0)
MCHC: 29.6 g/dL — ABNORMAL LOW (ref 30.0–36.0)
MCV: 64.5 fL — ABNORMAL LOW (ref 80.0–100.0)
PLATELETS: 275 10*3/uL (ref 150–400)
RBC: 5.5 MIL/uL — ABNORMAL HIGH (ref 3.87–5.11)
RDW: 16.5 % — ABNORMAL HIGH (ref 11.5–15.5)
WBC: 9.7 10*3/uL (ref 4.0–10.5)
nRBC: 0 % (ref 0.0–0.2)

## 2018-11-21 LAB — HEMOGLOBIN A1C
Hgb A1c MFr Bld: 4.9 % (ref 4.8–5.6)
Mean Plasma Glucose: 93.93 mg/dL

## 2018-11-21 LAB — LIPID PANEL
Cholesterol: 204 mg/dL — ABNORMAL HIGH (ref 0–200)
HDL: 60 mg/dL (ref >40)
LDL Cholesterol: 122 mg/dL — ABNORMAL HIGH (ref 0–99)
Total CHOL/HDL Ratio: 3.4 RATIO
Triglycerides: 109 mg/dL (ref <150)
VLDL: 22 mg/dL (ref 0–40)

## 2018-11-21 LAB — MRSA PCR SCREENING: MRSA BY PCR: NEGATIVE

## 2018-11-21 LAB — APTT: aPTT: 75 seconds — ABNORMAL HIGH (ref 24–36)

## 2018-11-21 LAB — MAGNESIUM: Magnesium: 2.3 mg/dL (ref 1.7–2.4)

## 2018-11-21 LAB — T4, FREE: Free T4: 0.8 ng/dL — ABNORMAL LOW (ref 0.82–1.77)

## 2018-11-21 LAB — PROTIME-INR
INR: 1.12
PROTHROMBIN TIME: 14.3 s (ref 11.4–15.2)

## 2018-11-21 LAB — TROPONIN I: Troponin I: <0.03 ng/mL (ref <0.03)

## 2018-11-21 LAB — TSH: TSH: 4.639 u[IU]/mL — ABNORMAL HIGH (ref 0.350–4.500)

## 2018-11-21 LAB — HEPARIN LEVEL (UNFRACTIONATED): Heparin Unfractionated: <0.1 IU/mL — ABNORMAL LOW (ref 0.30–0.70)

## 2018-11-21 LAB — FERRITIN: Ferritin: 110 ng/mL (ref 11–307)

## 2018-11-21 MED ORDER — POTASSIUM CHLORIDE CRYS ER 20 MEQ PO TBCR
40.0000 meq | EXTENDED_RELEASE_TABLET | Freq: Once | ORAL | Status: AC
  Administered 2018-11-21: 40 meq via ORAL
  Filled 2018-11-21: qty 2

## 2018-11-21 MED ORDER — NITROGLYCERIN 0.4 MG SL SUBL: 0.4000 mg | SUBLINGUAL_TABLET | SUBLINGUAL | Status: DC | PRN

## 2018-11-21 MED ORDER — TECHNETIUM TC 99M TETROFOSMIN IV KIT
10.0000 | PACK | Freq: Once | INTRAVENOUS | Status: AC | PRN
  Administered 2018-11-21: 10 via INTRAVENOUS

## 2018-11-21 MED ORDER — ZOLPIDEM TARTRATE 10 MG PO TABS: 5.0000 mg | ORAL_TABLET | Freq: Every day | ORAL | Status: DC

## 2018-11-21 MED ORDER — ALBUTEROL SULFATE (2.5 MG/3ML) 0.083% IN NEBU: 2.5000 mg | INHALATION_SOLUTION | RESPIRATORY_TRACT | Status: DC | PRN

## 2018-11-21 MED ORDER — MOMETASONE FURO-FORMOTEROL FUM 200-5 MCG/ACT IN AERO
2.0000 | INHALATION_SPRAY | Freq: Two times a day (BID) | RESPIRATORY_TRACT | Status: DC
  Filled 2018-11-21: qty 8.8

## 2018-11-21 MED ORDER — ASPIRIN EC 81 MG PO TBEC
81.0000 mg | DELAYED_RELEASE_TABLET | Freq: Every day | ORAL | Status: DC
  Administered 2018-11-22: 81 mg via ORAL
  Filled 2018-11-21 (×2): qty 1

## 2018-11-21 MED ORDER — ALBUTEROL SULFATE HFA 108 (90 BASE) MCG/ACT IN AERS: 2.0000 | INHALATION_SPRAY | RESPIRATORY_TRACT | Status: DC | PRN

## 2018-11-21 MED ORDER — REGADENOSON 0.4 MG/5ML IV SOLN
0.4000 mg | Freq: Once | INTRAVENOUS | Status: AC
  Administered 2018-11-21: 0.4 mg via INTRAVENOUS

## 2018-11-21 MED ORDER — MORPHINE SULFATE (PF) 4 MG/ML IV SOLN
4.0000 mg | Freq: Once | INTRAVENOUS | Status: AC
  Administered 2018-11-21: 4 mg via INTRAVENOUS
  Filled 2018-11-21: qty 1

## 2018-11-21 MED ORDER — ZOLPIDEM TARTRATE 5 MG PO TABS
5.0000 mg | ORAL_TABLET | Freq: Every day | ORAL | Status: DC
  Administered 2018-11-21: 5 mg via ORAL
  Filled 2018-11-21: qty 1

## 2018-11-21 MED ORDER — LAMOTRIGINE 25 MG PO TABS
150.0000 mg | ORAL_TABLET | Freq: Every day | ORAL | Status: DC
  Administered 2018-11-21 – 2018-11-22 (×2): 150 mg via ORAL
  Filled 2018-11-21 (×2): qty 2

## 2018-11-21 MED ORDER — LEVOTHYROXINE SODIUM 25 MCG PO TABS
25.0000 ug | ORAL_TABLET | Freq: Every day | ORAL | Status: DC
  Administered 2018-11-21 – 2018-11-22 (×2): 25 ug via ORAL
  Filled 2018-11-21 (×2): qty 1

## 2018-11-21 MED ORDER — ASPIRIN 300 MG RE SUPP: 300.0000 mg | RECTAL | Status: AC

## 2018-11-21 MED ORDER — ASPIRIN 81 MG PO CHEW
324.0000 mg | CHEWABLE_TABLET | ORAL | Status: AC
  Administered 2018-11-21: 324 mg via ORAL
  Filled 2018-11-21: qty 4

## 2018-11-21 MED ORDER — ACETAMINOPHEN 325 MG PO TABS: 650.0000 mg | ORAL_TABLET | ORAL | Status: DC | PRN

## 2018-11-21 MED ORDER — MOMETASONE FURO-FORMOTEROL FUM 200-5 MCG/ACT IN AERO
2.0000 | INHALATION_SPRAY | Freq: Two times a day (BID) | RESPIRATORY_TRACT | Status: DC
  Administered 2018-11-21 – 2018-11-22 (×2): 2 via RESPIRATORY_TRACT
  Filled 2018-11-21: qty 8.8

## 2018-11-21 MED ORDER — LEVONORGEST-ETH ESTRAD 91-DAY 0.15-0.03 &0.01 MG PO TABS: 1.0000 | ORAL_TABLET | Freq: Every day | ORAL | Status: DC

## 2018-11-21 MED ORDER — GUANFACINE HCL ER 1 MG PO TB24
1.0000 mg | ORAL_TABLET | Freq: Every day | ORAL | Status: DC
  Administered 2018-11-21 – 2018-11-22 (×2): 1 mg via ORAL
  Filled 2018-11-21 (×2): qty 1

## 2018-11-21 MED ORDER — MOMETASONE FURO-FORMOTEROL FUM 200-5 MCG/ACT IN AERO: 2.0000 | INHALATION_SPRAY | Freq: Two times a day (BID) | RESPIRATORY_TRACT | Status: DC

## 2018-11-21 MED ORDER — ONDANSETRON HCL 4 MG/2ML IJ SOLN: 4.0000 mg | Freq: Four times a day (QID) | INTRAMUSCULAR | Status: DC | PRN

## 2018-11-21 MED ORDER — ATORVASTATIN CALCIUM 40 MG PO TABS
40.0000 mg | ORAL_TABLET | Freq: Every day | ORAL | Status: DC
  Administered 2018-11-21: 40 mg via ORAL
  Filled 2018-11-21: qty 1

## 2018-11-21 MED ORDER — CITALOPRAM HYDROBROMIDE 10 MG PO TABS
20.0000 mg | ORAL_TABLET | Freq: Every day | ORAL | Status: DC
  Administered 2018-11-21 – 2018-11-22 (×2): 20 mg via ORAL
  Filled 2018-11-21 (×2): qty 2

## 2018-11-21 MED ORDER — PANTOPRAZOLE SODIUM 20 MG PO TBEC
20.0000 mg | DELAYED_RELEASE_TABLET | Freq: Every day | ORAL | Status: DC
  Administered 2018-11-21 – 2018-11-22 (×2): 20 mg via ORAL
  Filled 2018-11-21 (×2): qty 1

## 2018-11-21 MED ORDER — HEPARIN BOLUS VIA INFUSION
4000.0000 [IU] | Freq: Once | INTRAVENOUS | Status: AC
  Administered 2018-11-21: 4000 [IU] via INTRAVENOUS
  Filled 2018-11-21: qty 4000

## 2018-11-21 MED ORDER — KETOROLAC TROMETHAMINE 15 MG/ML IJ SOLN
15.0000 mg | Freq: Once | INTRAMUSCULAR | Status: AC
  Administered 2018-11-21: 15 mg via INTRAVENOUS
  Filled 2018-11-21: qty 1

## 2018-11-21 MED ORDER — REGADENOSON 0.4 MG/5ML IV SOLN
INTRAVENOUS | Status: AC
  Filled 2018-11-21: qty 5

## 2018-11-21 MED ORDER — TECHNETIUM TC 99M TETROFOSMIN IV KIT
30.0000 | PACK | Freq: Once | INTRAVENOUS | Status: AC | PRN
  Administered 2018-11-21: 30 via INTRAVENOUS

## 2018-11-21 MED ORDER — HEPARIN (PORCINE) 25000 UT/250ML-% IV SOLN
1000.0000 [IU]/h | INTRAVENOUS | Status: DC
  Administered 2018-11-21: 1000 [IU]/h via INTRAVENOUS
  Filled 2018-11-21: qty 250

## 2018-11-21 MED ORDER — IPRATROPIUM-ALBUTEROL 0.5-2.5 (3) MG/3ML IN SOLN: 3.0000 mL | RESPIRATORY_TRACT | Status: DC | PRN

## 2018-11-21 MED ORDER — LISDEXAMFETAMINE DIMESYLATE 20 MG PO CAPS
40.0000 mg | ORAL_CAPSULE | Freq: Every day | ORAL | Status: DC
  Administered 2018-11-22: 40 mg via ORAL
  Filled 2018-11-21: qty 2

## 2018-11-21 NOTE — ED Notes (Signed)
PTT not drawn [facility] Will draw went patient return.

## 2018-11-21 NOTE — H&P (Signed)
Referring Physician:   Timothea M Ladona Ridgelaylor is an 37 y.o. female.                       Chief Complaint: Chest pain  HPI: 37 year old female with h/o ADHD, asthma, depression and migraine headaches has recurrent left sided chest pressure and heaviness x 1 week. Her chest x-ray and CT of chest is unremarkable. She also has recurrent right leg edema. EKG showed sinus rhythm with frequent PVCs. She denies fever or chills. She also has left sided chest pain reproducible on palpation over left side of sternum and 3rd rib junction.  Past Medical History:  Diagnosis Date  . ADHD   . Anemia   . Anxiety   . Asthma   . Chlamydia   . Depression   . Headache       Past Surgical History:  Procedure Laterality Date  . ADENOIDECTOMY    . APPENDECTOMY    . CERVICAL BIOPSY  W/ LOOP ELECTRODE EXCISION    . CERVICAL POLYPECTOMY    . CESAREAN SECTION    . CESAREAN SECTION     x4  . CHOLECYSTECTOMY N/A 12/24/2014   Procedure: LAPAROSCOPIC CHOLECYSTECTOMY WITH INTRAOPERATIVE CHOLANGIOGRAM;  Surgeon: Glenna FellowsBenjamin Hoxworth, MD;  Location: [facility];  Service: General;  Laterality: N/A;  . DILATION AND CURETTAGE OF UTERUS    . LYMPHADENECTOMY    . TONSILLECTOMY    . TUBAL LIGATION      Family History  Problem Relation Age of Onset  . Diabetes Mother   . Hypertension Mother   . CAD Mother   . Allergic rhinitis Mother   . Asthma Sister    Social History:  reports that she quit smoking about 3 years ago. Her smoking use included cigarettes. She smoked 0.50 packs per day. She has never used smokeless tobacco. She reports that she does not drink alcohol or use drugs.  Allergies:  Allergies  Allergen Reactions  . Ferrous Gluconate Nausea Only  . Chantix [Varenicline] Other (See Comments)    Nightmare   . Dust Mite Mixed Allergen Ext [Mite (D. Farinae)]   . Other     GRASS and POLLEN   . Oxycodone Itching  . Amoxapine Itching  . Augmentin [Amoxicillin-Pot Clavulanate] Itching    Has patient had a PCN  reaction causing immediate rash, facial/tongue/throat swelling, SOB or lightheadedness with hypotension: Yes Has patient had a PCN reaction causing severe rash involving mucus membranes or skin necrosis: Yes Has patient had a PCN reaction that required hospitalization No Has patient had a PCN reaction occurring within the last 10 years: No If all of the above answers are "NO", then may proceed with Cephalosporin use.   Marland Kitchen. Penicillins Rash    Has patient had a PCN reaction causing immediate rash, facial/tongue/throat swelling, SOB or lightheadedness with hypotension: No Has patient had a PCN reaction causing severe rash involving mucus membranes or skin necrosis: No Has patient had a PCN reaction that required hospitalization No Has patient had a PCN reaction occurring within the last 10 years: No If all of the above answers are "NO", then may proceed with Cephalosporin use.     (Not in a hospital admission)   Results for orders placed or performed during the hospital encounter of 11/20/18 (from the past 48 hour(s))  Basic metabolic panel     Status: Abnormal   Collection Time: 11/20/18  8:19 PM  Result Value Ref Range   Sodium 139 135 -  145 mmol/L   Potassium 3.4 (L) 3.5 - 5.1 mmol/L   Chloride 106 98 - 111 mmol/L   CO2 22 22 - 32 mmol/L   Glucose, Bld 120 (H) 70 - 99 mg/dL   BUN 10 6 - 20 mg/dL   Creatinine, Ser 0.73 0.44 - 1.00 mg/dL   Calcium 9.2 8.9 - 09.610.3 mg/dL   GFR calc non Af Amer >60 >60 mL/min   GFR calc Af Amer >60 >60 mL/min   Anion gap 11 5 - 15    Comment: Performed at [facility], [address] [place], [place] [address]  CBC     Status: Abnormal   Collection Time: 11/20/18  8:19 PM  Result Value Ref Range   WBC 8.1 4.0 - 10.5 K/uL   RBC 6.24 (H) 3.87 - 5.11 MIL/uL   Hemoglobin 11.8 (L) 12.0 - 15.0 g/dL   HCT 98.140.2 19.136.0 - 47.846.0 %   MCV 64.4 (L) 80.0 - 100.0 fL   MCH 18.9 (L) 26.0 - 34.0 pg   MCHC 29.4 (L) 30.0 - 36.0 g/dL   RDW 29.517.6 (H)  62.111.5 - 15.5 %   Platelets 305 150 - 400 K/uL    Comment: REPEATED TO VERIFY   nRBC 0.0 0.0 - 0.2 %    Comment: Performed at [facility], [address] [place], [place] [address]  POCT i-Stat troponin I     Status: None   Collection Time: 11/20/18  8:23 PM  Result Value Ref Range   Troponin i, poc 0.00 0.00 - 0.08 ng/mL   Comment 3            Comment: Due to the release kinetics of cTnI, a negative result within the first hours of the onset of symptoms does not rule out myocardial infarction with certainty. If myocardial infarction is still suspected, repeat the test at appropriate intervals.   I-Stat beta hCG blood, ED     Status: None   Collection Time: 11/20/18  8:24 PM  Result Value Ref Range   I-stat hCG, quantitative <5.0 <5 mIU/mL   Comment 3            Comment:   GEST. AGE      CONC.  (mIU/mL)   <=1 WEEK        5 - 50     2 WEEKS       50 - 500     3 WEEKS       100 - 10,000     4 WEEKS     1,000 - 30,000        FEMALE AND NON-PREGNANT FEMALE:     LESS THAN 5 mIU/mL   TSH     Status: Abnormal   Collection Time: 11/21/18 12:26 AM  Result Value Ref Range   TSH 4.639 (H) 0.350 - 4.500 uIU/mL    Comment: Performed by a 3rd Generation assay with a functional sensitivity of <=0.01 uIU/mL. Performed at [facility], [address] [place], [place] [address]   Magnesium     Status: None   Collection Time: 11/21/18  2:00 AM  Result Value Ref Range   Magnesium 2.3 1.7 - 2.4 mg/dL    Comment: Performed at [facility], [address] [place], [place] [address]   Dg Chest 2 View  Result Date: 11/20/2018 CLINICAL DATA:  Chest pain, shortness of breath. EXAM: CHEST - 2 VIEW COMPARISON:  Radiographs of October 23, 2016. FINDINGS: The heart size and mediastinal contours are within normal limits. Both lungs are clear. No pneumothorax or pleural effusion is noted. The visualized skeletal structures are unremarkable.  IMPRESSION: No active cardiopulmonary disease. Electronically Signed   By: Lupita Raider, M.D.   On: 11/20/2018 19:50   Ct Angio Chest Pe W And/or Wo Contrast  Result Date: 11/21/2018 CLINICAL DATA:  Sharp chest pain and dyspnea for 2 weeks. EXAM: CT ANGIOGRAPHY CHEST WITH CONTRAST TECHNIQUE: Multidetector CT imaging of the chest was performed using the standard protocol during bolus administration of intravenous contrast. Multiplanar CT image reconstructions and MIPs were obtained to evaluate the vascular anatomy. CONTRAST:  ISOVUE-370 IOPAMIDOL (ISOVUE-370) INJECTION 76% COMPARISON:  June 03, 2015 FINDINGS: Cardiovascular: Satisfactory opacification of the pulmonary arteries to the segmental level. No evidence of pulmonary embolism. Normal heart size. No pericardial effusion. Mediastinum/Nodes: No enlarged mediastinal, hilar, or axillary lymph nodes. Thyroid gland, trachea, and esophagus demonstrate no significant findings. Lungs/Pleura: Lungs are clear. No pleural effusion or pneumothorax. Upper Abdomen: No acute abnormality. Patient status post prior cholecystectomy. Minimal hiatal hernia is noted. Musculoskeletal: No chest wall abnormality. No acute or significant osseous findings. Review of the MIP images confirms the above findings. IMPRESSION: No pulmonary embolus. No acute abnormality identified in the chest. Electronically Signed   By: Sherian Rein M.D.   On: 11/21/2018 00:20    Review Of Systems Constitutional: No fever, chills, weight loss or gain. Eyes: No vision change, wears glasses. No discharge or pain. Ears: No hearing loss, No tinnitus. Respiratory: Positive asthma, no COPD, pneumonias. Positive shortness of breath. No hemoptysis. Cardiovascular: Positive chest pain, palpitation, leg edema. Gastrointestinal: No nausea, vomiting, diarrhea, constipation. No GI bleed. No hepatitis. Genitourinary: No dysuria, hematuria, kidney stone. No incontinance. Neurological: Positive  headache, stroke, seizures.  Psychiatry: No psych facility admission for anxiety, depression, suicide. No detox. Skin: No rash. Musculoskeletal: No joint pain, positive fibromyalgia. No neck pain, back pain. Lymphadenopathy: No lymphadenopathy. Hematology: No anemia or easy bruising.   Blood pressure 127/72, pulse (!) 48, temperature 99.1 F (37.3 C), temperature source Oral, resp. rate 13, SpO2 98 %. There is no height or weight on file to calculate BMI. General appearance: alert, cooperative, appears stated age and no distress Head: Normocephalic, atraumatic. Eyes: Blue eyes, pink conjunctiva, corneas clear. PERRL, EOM's intact. Neck: No adenopathy, no carotid bruit, no JVD, supple, symmetrical, trachea midline and thyroid not enlarged. Resp: Clear to auscultation bilaterally. Chest wall tender on palpation over left of sternum over 2nd and 3rd rib. Cardio: Regular rate and rhythm, S1, S2 normal, II/VI systolic murmur, no click, rub or gallop GI: Soft, non-tender; bowel sounds normal; no organomegaly. Extremities: Trace right leg edema, no cyanosis or clubbing. Skin: Warm and dry.  Neurologic: Alert and oriented X 3, normal strength. Normal coordination and slow gait.  Assessment/Plan Acute coronary syndrome Chest pain Obesity, Morbid Asthma ADHD Hypothyroidism  Admit R/O MI. NM myocardial perfusion stress test.  Ricki Rodriguez, MD  11/21/2018, 2:50 AM

## 2018-11-21 NOTE — Progress Notes (Signed)
ANTICOAGULATION CONSULT NOTE - Initial Consult  Pharmacy Consult for IV heparin Indication: chest pain/ACS  Allergies  Allergen Reactions  . Ferrous Gluconate Nausea Only  . Chantix [Varenicline] Other (See Comments)    Nightmare   . Dust Mite Mixed Allergen Ext [Mite (D. Farinae)]   . Other     GRASS and POLLEN   . Oxycodone Itching  . Amoxapine Itching  . Augmentin [Amoxicillin-Pot Clavulanate] Itching    Has patient had a PCN reaction causing immediate rash, facial/tongue/throat swelling, SOB or lightheadedness with hypotension: Yes Has patient had a PCN reaction causing severe rash involving mucus membranes or skin necrosis: Yes Has patient had a PCN reaction that required hospitalization No Has patient had a PCN reaction occurring within the last 10 years: No If all of the above answers are "NO", then may proceed with Cephalosporin use.   Marland Kitchen. Penicillins Rash    Has patient had a PCN reaction causing immediate rash, facial/tongue/throat swelling, SOB or lightheadedness with hypotension: No Has patient had a PCN reaction causing severe rash involving mucus membranes or skin necrosis: No Has patient had a PCN reaction that required hospitalization No Has patient had a PCN reaction occurring within the last 10 years: No If all of the above answers are "NO", then may proceed with Cephalosporin use.     Patient Measurements:   Heparin Dosing Weight: 81 kg  Vital Signs: Temp: 99.1 F (37.3 C) (12/27 1924) Temp Source: Oral (12/27 1924) BP: 127/72 (12/27 2330) Pulse Rate: 48 (12/27 2330)  Labs: Recent Labs    11/19/18 1234 11/20/18 2019  HGB 11.4* 11.8*  HCT 36.6 40.2  PLT 286.0 305  CREATININE 0.68 0.73    Estimated Creatinine Clearance: 131.7 mL/min (by C-G formula based on SCr of 0.73 mg/dL).   Medical History: Past Medical History:  Diagnosis Date  . ADHD   . Anemia   . Anxiety   . Asthma   . Chlamydia   . Depression   . Headache     Medications:   Scheduled:  . aspirin  324 mg Oral NOW   Or  . aspirin  300 mg Rectal NOW  . [START ON 11/22/2018] aspirin EC  81 mg Oral Daily  . atorvastatin  40 mg Oral q1800  . citalopram  20 mg Oral Daily  . guanFACINE  1 mg Oral Daily  . iopamidol      . lamoTRIgine  150 mg Oral Daily  . Levonorgestrel-Ethinyl Estradiol  1 tablet Oral Daily  . levothyroxine  25 mcg Oral Daily  . lisdexamfetamine  40 mg Oral Daily  . mometasone-formoterol  2 puff Inhalation BID  . pantoprazole  20 mg Oral Daily  . sodium chloride (PF)      . zolpidem  10 mg Oral QHS   Infusions:    Assessment: [age] yoF c/o recurrent left side chest pressure and heaviness x1 week.  Baseline labs:  Coags pending plts = 305 H/H = 11.8/40.2  Goal of Therapy:  Heparin level 0.3-0.7 units/ml Monitor platelets by anticoagulation protocol: Yes   Plan:  Baseline aPtt, PT/INR STAT Heparin 4000 unit IV bolus x1 Start drip at 1000 units/hr Daily CBC/HL Check 1st HL in 6 hours  Susanne GreenhouseGreen, Medford Staheli R 11/21/2018,2:56 AM

## 2018-11-22 ENCOUNTER — Other Ambulatory Visit: Payer: Self-pay

## 2018-11-22 LAB — CBC
HCT: 34.9 % — ABNORMAL LOW (ref 36.0–46.0)
Hemoglobin: 10.5 g/dL — ABNORMAL LOW (ref 12.0–15.0)
MCH: 19.1 pg — ABNORMAL LOW (ref 26.0–34.0)
MCHC: 30.1 g/dL (ref 30.0–36.0)
MCV: 63.3 fL — ABNORMAL LOW (ref 80.0–100.0)
Platelets: 250 10*3/uL (ref 150–400)
RBC: 5.51 MIL/uL — ABNORMAL HIGH (ref 3.87–5.11)
RDW: 16.3 % — ABNORMAL HIGH (ref 11.5–15.5)
WBC: 6.7 10*3/uL (ref 4.0–10.5)
nRBC: 0 % (ref 0.0–0.2)

## 2018-11-22 MED ORDER — METOPROLOL SUCCINATE ER 25 MG PO TB24: 25.0000 mg | ORAL_TABLET | Freq: Every day | ORAL | 3 refills | Status: DC

## 2018-11-22 MED ORDER — ATORVASTATIN CALCIUM 40 MG PO TABS: 40.0000 mg | ORAL_TABLET | Freq: Every day | ORAL | 3 refills | Status: DC

## 2018-11-22 MED ORDER — METOPROLOL SUCCINATE ER 25 MG PO TB24
25.0000 mg | ORAL_TABLET | Freq: Every day | ORAL | Status: DC
  Administered 2018-11-22: 25 mg via ORAL
  Filled 2018-11-22: qty 1

## 2018-11-22 NOTE — Plan of Care (Signed)
   Problem: Education: Goal: Understanding of cardiac disease, CV risk reduction, and recovery process will improve Outcome: Progressing   Problem: Activity: Goal: Ability to tolerate increased activity will improve Outcome: Progressing     

## 2018-11-22 NOTE — Discharge Summary (Signed)
Physician Discharge Summary  Patient ID: Crystal Myers MRN: [id number] DOB/AGE: Mar 28, 1981 37 y.o.  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 She underwent nuclear stress test with no reversible ischemia and EF of 54 %. Her CT chest was negative for PE. EKG showed NSR, frequent VPCs and low voltage.  She was advised to use over the counter analgesic as needed, increase activity, take atorvastatin for cholesterol control, home medications and see primary doctor in 1 week and me in 1 month.  Consults: cardiology  Significant Diagnostic Studies: labs: Normal WBC and platelets count, chronically low hgb of 10.5 to 11.8 g/dL and MCV of 60 to 64 fL.  BMET near normal. Low Hgb A1C. Magnesium level of 2.3 mg/dL. Lipid panel showed total cholesterol of 204, HDl cholesterol of 60 mg. and LDL cholesterol of 122 mg. Troponin I - normal.  EKG- NSR, Freq. VPCs, low voltage.  Chest x-ray: Unremarkable.  CT chest : negative for PE.  NM myocardial perfusion stress test: No reversible ischemia and EF 54 %.  Treatments: cardiac meds: metoprolol and Atorvastatin.  Discharge Exam: Blood pressure 107/83, pulse 80, temperature 98.2 F (36.8 C), temperature source Oral, resp. rate 16, height 5\' 8"  (1.727 m), weight 122.9 kg, SpO2 97 %. General appearance: alert, cooperative and  appears stated age. Head: Normocephalic, atraumatic. Eyes: Blue eyes, pink conjunctiva, corneas clear. PERRL, EOM's intact.  Neck: No adenopathy, no carotid bruit, no JVD, supple, symmetrical, trachea midline and thyroid not enlarged. Resp: Clear to auscultation bilaterally. Chest wall tender on palpation over upper left precordium. Cardio: Regular rate and rhythm, S1, S2 normal, II/VI systolic murmur, no click, rub or gallop. GI: Soft, non-tender; bowel sounds normal; no organomegaly. Extremities: Trace edema, no cyanosis or clubbing. Skin: Warm and dry.  Neurologic: Alert and oriented X 3, normal strength and tone. Normal coordination and gait.  Disposition: Discharge disposition: 01-Home or Self Care        Allergies as of 11/22/2018      Reactions   Ferrous Gluconate Nausea Only   Chantix [varenicline] Other (See Comments)   Nightmare   Dust Mite Mixed Allergen Ext [mite (d. Farinae)]    Other    GRASS and POLLEN    Oxycodone Itching   Amoxapine Itching   Augmentin [amoxicillin-pot Clavulanate] Itching   Has patient had a PCN reaction causing immediate rash, facial/tongue/throat swelling, SOB or lightheadedness with hypotension: Yes Has patient had a PCN reaction causing severe rash involving mucus membranes or skin necrosis: Yes Has patient had a PCN reaction that required hospitalization No Has patient had a PCN reaction occurring within the last 10 years: No If all of the above answers are "NO", then may proceed with Cephalosporin use.   Penicillins Rash   Has patient had a PCN reaction causing immediate rash, facial/tongue/throat swelling, SOB or lightheadedness with hypotension: No Has patient had a PCN reaction causing severe rash involving mucus membranes or skin necrosis: No Has  patient had a PCN reaction that required hospitalization No Has patient had a PCN reaction occurring within the last 10 years: No If all of the above answers are "NO", then may proceed with  Cephalosporin use.      Medication List    TAKE these medications   albuterol 108 (90 Base) MCG/ACT inhaler Commonly known as:  PROAIR HFA Inhale 2 puffs into the lungs every 4 (four) hours as needed for wheezing or shortness of breath.   AMBIEN 10 MG tablet Generic drug:  zolpidem Take 10 mg by mouth at bedtime.   atorvastatin 40 MG tablet Commonly known as:  LIPITOR Take 1 tablet (40 mg total) by mouth daily at 6 PM.   citalopram 20 MG tablet Commonly known as:  CELEXA Take 20 mg by mouth daily.   EPIPEN 2-PAK 0.3 mg/0.3 mL Soaj injection Generic drug:  EPINEPHrine Inject 1 Dose as directed as needed (for allergic reaction).   guanFACINE 1 MG Tb24 ER tablet Commonly known as:  INTUNIV Take 1 mg by mouth daily.   ipratropium-albuterol 0.5-2.5 (3) MG/3ML Soln Commonly known as:  DUONEB Take 3 mLs by nebulization every 2 (two) hours as needed. What changed:  reasons to take this   lamoTRIgine 150 MG tablet Commonly known as:  LAMICTAL Take 150 mg by mouth daily.   Levonorgestrel-Ethinyl Estradiol 0.15-0.03 &0.01 MG tablet Commonly known as:  CAMRESE Take 1 tablet by mouth daily.   levothyroxine 25 MCG tablet Commonly known as:  SYNTHROID, LEVOTHROID Take 25 mcg by mouth daily.   metoprolol succinate 25 MG 24 hr tablet Commonly known as:  TOPROL-XL Take 1 tablet (25 mg total) by mouth daily.   mometasone-formoterol 200-5 MCG/ACT Aero Commonly known as:  DULERA Inhale 2 puffs into the lungs 2 (two) times daily.   pantoprazole 20 MG tablet Commonly known as:  PROTONIX Take 20 mg by mouth daily.   VYVANSE 40 MG capsule Generic drug:  lisdexamfetamine Take 40 mg by mouth daily.      Follow-up Information    Ronney LionHazy, Marian, NP. Schedule an appointment as soon as possible for a visit in 1 week(s).   Specialty:  Family Medicine Contact information: [address] [place] [address] [phone]        Orpah CobbKadakia, Joaopedro Eschbach, MD. Schedule an  appointment as soon as possible for a visit in 1 month(s).   Specialty:  Cardiology Contact information: [address] [place] [address] [phone]           Signed: Ricki Rodriguezjay S Giavonna Pflum 11/22/2018, 10:43 AM

## 2018-12-15 ENCOUNTER — Other Ambulatory Visit: Payer: Self-pay | Admitting: Pulmonary Disease

## 2018-12-17 ENCOUNTER — Other Ambulatory Visit: Payer: Self-pay | Admitting: Pulmonary Disease

## 2019-01-02 ENCOUNTER — Ambulatory Visit (HOSPITAL_BASED_OUTPATIENT_CLINIC_OR_DEPARTMENT_OTHER): Payer: Medicaid Other | Attending: Pulmonary Disease

## 2019-01-14 ENCOUNTER — Ambulatory Visit: Payer: Medicaid Other | Admitting: Pulmonary Disease

## 2019-03-08 ENCOUNTER — Other Ambulatory Visit: Payer: Self-pay | Admitting: Pulmonary Disease

## 2019-05-22 ENCOUNTER — Other Ambulatory Visit: Payer: Self-pay

## 2019-05-22 ENCOUNTER — Emergency Department (HOSPITAL_COMMUNITY)
Admission: EM | Admit: 2019-05-22 | Discharge: 2019-05-22 | Disposition: A | Discharge status: Home / Self Care | Payer: Medicaid Other | Attending: Emergency Medicine | Admitting: Emergency Medicine

## 2019-05-22 ENCOUNTER — Emergency Department (HOSPITAL_COMMUNITY): Payer: Medicaid Other

## 2019-05-22 ENCOUNTER — Encounter (HOSPITAL_COMMUNITY): Payer: Self-pay

## 2019-05-22 DIAGNOSIS — I10 Essential (primary) hypertension: Secondary | ICD-10-CM | POA: Insufficient documentation

## 2019-05-22 DIAGNOSIS — R109 Unspecified abdominal pain: Secondary | ICD-10-CM | POA: Diagnosis present

## 2019-05-22 DIAGNOSIS — J45909 Unspecified asthma, uncomplicated: Secondary | ICD-10-CM | POA: Insufficient documentation

## 2019-05-22 DIAGNOSIS — Z79899 Other long term (current) drug therapy: Secondary | ICD-10-CM | POA: Insufficient documentation

## 2019-05-22 DIAGNOSIS — Z87891 Personal history of nicotine dependence: Secondary | ICD-10-CM | POA: Diagnosis not present

## 2019-05-22 DIAGNOSIS — K529 Noninfective gastroenteritis and colitis, unspecified: Secondary | ICD-10-CM

## 2019-05-22 HISTORY — DX: Essential (primary) hypertension: I10

## 2019-05-22 LAB — URINALYSIS, ROUTINE W REFLEX MICROSCOPIC
Bilirubin Urine: NEGATIVE
Glucose, UA: NEGATIVE mg/dL
Hgb urine dipstick: NEGATIVE
Ketones, ur: NEGATIVE mg/dL
Leukocytes,Ua: NEGATIVE
Nitrite: NEGATIVE
Protein, ur: NEGATIVE mg/dL
Specific Gravity, Urine: 1.016 (ref 1.005–1.030)
pH: 5 (ref 5.0–8.0)

## 2019-05-22 LAB — CBC WITH DIFFERENTIAL/PLATELET
Abs Immature Granulocytes: 0.05 10*3/uL (ref 0.00–0.07)
Basophils Absolute: 0.1 10*3/uL (ref 0.0–0.1)
Basophils Relative: 0 %
Eosinophils Absolute: 0.2 10*3/uL (ref 0.0–0.5)
Eosinophils Relative: 1 %
HCT: 39.9 % (ref 36.0–46.0)
Hemoglobin: 11.8 g/dL — ABNORMAL LOW (ref 12.0–15.0)
Immature Granulocytes: 0 %
Lymphocytes Relative: 12 %
Lymphs Abs: 1.6 10*3/uL (ref 0.7–4.0)
MCH: 18.6 pg — ABNORMAL LOW (ref 26.0–34.0)
MCHC: 29.6 g/dL — ABNORMAL LOW (ref 30.0–36.0)
MCV: 62.9 fL — ABNORMAL LOW (ref 80.0–100.0)
Monocytes Absolute: 0.4 10*3/uL (ref 0.1–1.0)
Monocytes Relative: 3 %
Neutro Abs: 11.3 10*3/uL — ABNORMAL HIGH (ref 1.7–7.7)
Neutrophils Relative %: 84 %
Platelets: 297 10*3/uL (ref 150–400)
RBC: 6.34 MIL/uL — ABNORMAL HIGH (ref 3.87–5.11)
RDW: 17.2 % — ABNORMAL HIGH (ref 11.5–15.5)
WBC: 13.6 10*3/uL — ABNORMAL HIGH (ref 4.0–10.5)
nRBC: 0 % (ref 0.0–0.2)

## 2019-05-22 LAB — COMPREHENSIVE METABOLIC PANEL
ALT: 14 U/L (ref 0–44)
AST: 18 U/L (ref 15–41)
Albumin: 4 g/dL (ref 3.5–5.0)
Alkaline Phosphatase: 86 U/L (ref 38–126)
Anion gap: 13 (ref 5–15)
BUN: 10 mg/dL (ref 6–20)
CO2: 20 mmol/L — ABNORMAL LOW (ref 22–32)
Calcium: 9.5 mg/dL (ref 8.9–10.3)
Chloride: 106 mmol/L (ref 98–111)
Creatinine, Ser: 0.77 mg/dL (ref 0.44–1.00)
GFR calc Af Amer: >60 mL/min (ref >60)
GFR calc non Af Amer: >60 mL/min (ref >60)
Glucose, Bld: 106 mg/dL — ABNORMAL HIGH (ref 70–99)
Potassium: 4 mmol/L (ref 3.5–5.1)
Sodium: 139 mmol/L (ref 135–145)
Total Bilirubin: 0.6 mg/dL (ref 0.3–1.2)
Total Protein: 7.4 g/dL (ref 6.5–8.1)

## 2019-05-22 LAB — I-STAT BETA HCG BLOOD, ED (MC, WL, AP ONLY): I-stat hCG, quantitative: <5 m[IU]/mL (ref <5)

## 2019-05-22 LAB — LIPASE, BLOOD: Lipase: 29 U/L (ref 11–51)

## 2019-05-22 MED ORDER — HYDROCODONE-ACETAMINOPHEN 5-325 MG PO TABS: 1.0000 | ORAL_TABLET | Freq: Four times a day (QID) | ORAL | 0 refills | Status: DC | PRN

## 2019-05-22 MED ORDER — SODIUM CHLORIDE 0.9 % IV BOLUS
1000.0000 mL | Freq: Once | INTRAVENOUS | Status: AC
  Administered 2019-05-22: 1000 mL via INTRAVENOUS

## 2019-05-22 MED ORDER — ONDANSETRON 4 MG PO TBDP: 4.0000 mg | ORAL_TABLET | Freq: Three times a day (TID) | ORAL | 0 refills | Status: DC | PRN

## 2019-05-22 MED ORDER — MORPHINE SULFATE (PF) 4 MG/ML IV SOLN
4.0000 mg | Freq: Once | INTRAVENOUS | Status: AC
  Administered 2019-05-22: 4 mg via INTRAVENOUS
  Filled 2019-05-22: qty 1

## 2019-05-22 MED ORDER — ONDANSETRON HCL 4 MG/2ML IJ SOLN
4.0000 mg | Freq: Once | INTRAMUSCULAR | Status: AC
  Administered 2019-05-22: 4 mg via INTRAVENOUS
  Filled 2019-05-22: qty 2

## 2019-05-22 MED ORDER — IOHEXOL 300 MG/ML  SOLN
100.0000 mL | Freq: Once | INTRAMUSCULAR | Status: AC | PRN
  Administered 2019-05-22: 100 mL via INTRAVENOUS

## 2019-05-22 MED ORDER — CIPROFLOXACIN HCL 500 MG PO TABS: 500.0000 mg | ORAL_TABLET | Freq: Two times a day (BID) | ORAL | 0 refills | Status: DC

## 2019-05-22 MED ORDER — CIPROFLOXACIN HCL 500 MG PO TABS
500.0000 mg | ORAL_TABLET | Freq: Once | ORAL | Status: AC
  Administered 2019-05-22: 500 mg via ORAL
  Filled 2019-05-22: qty 1

## 2019-05-22 MED ORDER — METRONIDAZOLE 500 MG PO TABS
500.0000 mg | ORAL_TABLET | Freq: Once | ORAL | Status: AC
  Administered 2019-05-22: 500 mg via ORAL
  Filled 2019-05-22: qty 1

## 2019-05-22 MED ORDER — METRONIDAZOLE 500 MG PO TABS: 500.0000 mg | ORAL_TABLET | Freq: Three times a day (TID) | ORAL | 0 refills | Status: AC

## 2019-05-22 NOTE — Discharge Instructions (Addendum)
Please read instructions below. Drink clear liquids until your stomach feels better. Then, slowly introduce bland foods into your diet as tolerated, such as bread, rice, apples, bananas. You can take zofran every 8 hours as needed for nausea. Take the antibiotics as prescribed until gone. If you have severe pain, you can take the hydrocodone every 6 hours.  This causes itching, you can take Benadryl with this. Follow up with your primary care.  Schedule an appointment with the gastroenterologist. Return to the ER for severe abdominal pain, fever, uncontrollable vomiting, or new or concerning symptoms.

## 2019-05-22 NOTE — ED Provider Notes (Signed)
[facility] Provider Note   CSN: [id number] Arrival date & time: 05/22/19  1157    History   Chief Complaint Chief Complaint  Patient presents with  . Abdominal Pain    HPI Crystal Myers is a 38 y.o. female with past medical history of hypertension, anxiety, GERD, CAD, presenting to the emergency department with complaint of gradual onset of worsening central abdominal pain that began last night.  Patient states pain feels somewhat similar to when she had gallstones.  She has associated nausea and loose stools.  Zofran given by EMS in route provided some relief of nausea.  She denies history of pancreatitis or recent alcohol use.  Previous abdominal surgeries include cholecystectomy and appendectomy.      The history is provided by the patient.    Past Medical History:  Diagnosis Date  . ADHD   . Anemia   . Anxiety   . Asthma   . Chlamydia   . Depression   . Headache   . Hypertension     Patient Active Problem List   Diagnosis Date Noted  . Acute coronary syndrome (HCC) 11/21/2018  . Chest pain 11/19/2018  . GERD (gastroesophageal reflux disease) 11/19/2018  . OSA (obstructive sleep apnea) 05/13/2018  . Dysphagia 05/13/2018  . Perennial and seasonal allergic rhinitis 07/08/2016  . Allergic conjunctivitis 07/08/2016  . Moderate persistent asthma 07/08/2016  . Generalized pruritus 07/08/2016  . Depression 12/24/2014  . Acute calculous cholecystitis s/p lap chole 12/24/2014 12/23/2014  . Migraine 02/11/2013    Past Surgical History:  Procedure Laterality Date  . ADENOIDECTOMY    . APPENDECTOMY    . CERVICAL BIOPSY  W/ LOOP ELECTRODE EXCISION    . CERVICAL POLYPECTOMY    . CESAREAN SECTION    . CESAREAN SECTION     x4  . CHOLECYSTECTOMY N/A 12/24/2014   Procedure: LAPAROSCOPIC CHOLECYSTECTOMY WITH INTRAOPERATIVE CHOLANGIOGRAM;  Surgeon: Glenna FellowsBenjamin Hoxworth, MD;  Location: [facility] OR[facility];  Service: General;  Laterality: N/A;  .  DILATION AND CURETTAGE OF UTERUS    . LYMPHADENECTOMY    . TONSILLECTOMY    . TUBAL LIGATION       OB History    Gravida  5   Para  5   Term  3   Preterm  2   AB      Living  3     SAB      TAB      Ectopic      Multiple      Live Births  4            Home Medications    Prior to Admission medications   Medication Sig Start Date End Date Taking? Authorizing Provider  albuterol (PROAIR HFA) 108 (90 Base) MCG/ACT inhaler Inhale 2 puffs into the lungs every 4 (four) hours as needed for wheezing or shortness of breath. 08/19/18   Oretha MilchAlva, Rakesh V, MD  atorvastatin (LIPITOR) 40 MG tablet Take 1 tablet (40 mg total) by mouth daily at 6 PM. 11/22/18   Orpah CobbKadakia, Ajay, MD  ciprofloxacin (CIPRO) 500 MG tablet Take 1 tablet (500 mg total) by mouth 2 (two) times daily. 05/22/19   , [place] N, PA-C  citalopram (CELEXA) 20 MG tablet Take 20 mg by mouth daily.     [provider]  EPINEPHrine (EPIPEN JR) 0.15 MG/0.3ML injection INJECT 0.3 MLS (0.15 MG TOTAL) INTO THE MUSCLE AS NEEDED FOR ANAPHYLAXIS. 12/16/18   Oretha MilchAlva, Rakesh V, MD  EPINEPHRINE 0.3 mg/0.3 mL IJ SOAJ injection INJECT 0.3 MG INTO THE MUSCLE AS NEEDED FOR ANAPHYLAXIS 03/08/19   Rigoberto Noel, MD  guanFACINE (INTUNIV) 1 MG TB24 ER tablet Take 1 mg by mouth daily. 05/13/18   [provider]  HYDROcodone-acetaminophen (NORCO/VICODIN) 5-325 MG tablet Take 1-2 tablets by mouth every 6 (six) hours as needed. 05/22/19   , [place] N, PA-C  ipratropium-albuterol (DUONEB) 0.5-2.5 (3) MG/3ML SOLN Take 3 mLs by nebulization every 2 (two) hours as needed. Patient taking differently: Take 3 mLs by nebulization every 2 (two) hours as needed (for wheezing).  10/03/15   Barrett, Lahoma Crocker, PA-C  lamoTRIgine (LAMICTAL) 150 MG tablet Take 150 mg by mouth daily.     [provider]  Levonorgestrel-Ethinyl Estradiol (CAMRESE) 0.15-0.03 &0.01 MG tablet Take 1 tablet by mouth daily. 05/14/18   Constant, Peggy, MD   levothyroxine (SYNTHROID, LEVOTHROID) 25 MCG tablet Take 25 mcg by mouth daily. 04/30/18   [provider]  metoprolol succinate (TOPROL-XL) 25 MG 24 hr tablet Take 1 tablet (25 mg total) by mouth daily. 11/22/18   Dixie Dials, MD  metroNIDAZOLE (FLAGYL) 500 MG tablet Take 1 tablet (500 mg total) by mouth 3 (three) times daily for 7 days. 05/22/19 05/29/19  , [place] N, PA-C  mometasone-formoterol (DULERA) 200-5 MCG/ACT AERO Inhale 2 puffs into the lungs 2 (two) times daily. 08/19/18   Rigoberto Noel, MD  ondansetron (ZOFRAN ODT) 4 MG disintegrating tablet Take 1 tablet (4 mg total) by mouth every 8 (eight) hours as needed for nausea or vomiting. 05/22/19   , [place] N, PA-C  pantoprazole (PROTONIX) 20 MG tablet Take 20 mg by mouth daily.    [provider]  VYVANSE 40 MG capsule Take 40 mg by mouth daily. 09/01/17   [provider]  zolpidem (AMBIEN) 10 MG tablet Take 10 mg by mouth at bedtime.     [provider]    Family History Family History  Problem Relation Age of Onset  . Diabetes Mother   . Hypertension Mother   . CAD Mother   . Allergic rhinitis Mother   . Asthma Sister     Social History Social History   Tobacco Use  . Smoking status: Former Smoker    Packs/day: 0.50    Types: Cigarettes    Quit date: 11/25/2014    Years since quitting: 4.4  . Smokeless tobacco: Never Used  Substance Use Topics  . Alcohol use: No    Alcohol/week: 0.0 standard drinks    Comment: former  . Drug use: No     Allergies   Ferrous gluconate, Chantix [varenicline], Dust mite mixed allergen ext [mite (d. farinae)], Other, Oxycodone, Amoxapine, Augmentin [amoxicillin-pot clavulanate], and Penicillins   Review of Systems Review of Systems  Constitutional: Negative for fever.  Gastrointestinal: Positive for abdominal pain and nausea. Negative for vomiting.  Genitourinary: Negative for dysuria.  All other systems reviewed and are negative.     Physical Exam Updated Vital Signs BP 136/73 (BP Location: Right Arm)   Pulse 95   Temp 98.1 F (36.7 C) (Oral)   Resp 20   Ht 5' 8.5" (1.74 m)   Wt 117.5 kg   LMP  (LMP Unknown)   SpO2 97%   BMI 38.81 kg/m   Physical Exam Vitals signs and nursing note reviewed.  Constitutional:      Appearance: She is well-developed. She is obese. She is not ill-appearing.  HENT:     Head: Normocephalic  and atraumatic.  Eyes:     Conjunctiva/sclera: Conjunctivae normal.  Cardiovascular:     Rate and Rhythm: Normal rate and regular rhythm.  Pulmonary:     Effort: Pulmonary effort is normal. No respiratory distress.     Breath sounds: Normal breath sounds.  Abdominal:     General: A surgical scar is present. Bowel sounds are normal.     Palpations: Abdomen is soft.     Tenderness: There is generalized abdominal tenderness. There is no guarding or rebound.  Skin:    General: Skin is warm.  Neurological:     Mental Status: She is alert.  Psychiatric:        Behavior: Behavior normal.      ED Treatments / Results  Labs (all labs ordered are listed, but only abnormal results are displayed) Labs Reviewed  COMPREHENSIVE METABOLIC PANEL - Abnormal; Notable for the following components:      Result Value   CO2 20 (*)    Glucose, Bld 106 (*)    All other components within normal limits  CBC WITH DIFFERENTIAL/PLATELET - Abnormal; Notable for the following components:   WBC 13.6 (*)    RBC 6.34 (*)    Hemoglobin 11.8 (*)    MCV 62.9 (*)    MCH 18.6 (*)    MCHC 29.6 (*)    RDW 17.2 (*)    Neutro Abs 11.3 (*)    All other components within normal limits  LIPASE, BLOOD  URINALYSIS, ROUTINE W REFLEX MICROSCOPIC  I-STAT BETA HCG BLOOD, ED ([facility], [facility], [facility] ONLY)    EKG None  Radiology Ct Abdomen Pelvis W Contrast  Result Date: 05/22/2019 CLINICAL DATA:  Generalized abdominal pain EXAM: CT ABDOMEN AND PELVIS WITH CONTRAST TECHNIQUE: Multidetector CT imaging of the abdomen and pelvis was  performed using the standard protocol following bolus administration of intravenous contrast. CONTRAST:  100mL OMNIPAQUE IOHEXOL 300 MG/ML  SOLN COMPARISON:  07/23/2018 FINDINGS: Lower chest: No acute abnormality. Hepatobiliary: No focal liver abnormality is seen. Status post cholecystectomy. No biliary dilatation. Pancreas: Unremarkable. No pancreatic ductal dilatation or surrounding inflammatory changes. Spleen: Normal in size without significant abnormality. Adrenals/Urinary Tract: Adrenal glands are unremarkable. Kidneys are normal, without renal calculi, solid lesion, or hydronephrosis. Bladder is unremarkable. Stomach/Bowel: Stomach is within normal limits. Appendix is not clearly visualized. There are multiple loops of thickened, inflamed appearing distal ileum in the low mid abdomen (series 3, image 77). Sigmoid diverticulosis. Vascular/Lymphatic: No significant vascular findings are present. No enlarged abdominal or pelvic lymph nodes. Reproductive: No mass or other significant abnormality. Other: No abdominal wall hernia or abnormality. Small volume ascites in the low pelvis. Musculoskeletal: No acute or significant osseous findings. IMPRESSION: 1. There are multiple loops of thickened, inflamed appearing distal ileum in the low mid abdomen (series 3, image 77). Findings are consistent with nonspecific infectious or inflammatory enteritis, particularly including Crohn's disease given this appearance. These findings are new in comparison to prior CT dated 07/23/2018. 2.  Small volume ascites in the low pelvis, which is likely reactive Electronically Signed   By: Lauralyn PrimesAlex  Bibbey M.D.   On: 05/22/2019 13:58    Procedures Procedures (including critical care time)  Medications Ordered in ED Medications  ondansetron (ZOFRAN) injection 4 mg (4 mg Intravenous Given 05/22/19 1225)  sodium chloride 0.9 % bolus 1,000 mL (0 mLs Intravenous Stopped 05/22/19 1456)  morphine 4 MG/ML injection 4 mg (4 mg Intravenous  Given 05/22/19 1225)  iohexol (OMNIPAQUE) 300 MG/ML solution 100 mL (  100 mLs Intravenous Contrast Given 05/22/19 1327)  morphine 4 MG/ML injection 4 mg (4 mg Intravenous Given 05/22/19 1425)  ciprofloxacin (CIPRO) tablet 500 mg (500 mg Oral Given 05/22/19 1452)  metroNIDAZOLE (FLAGYL) tablet 500 mg (500 mg Oral Given 05/22/19 1452)     Initial Impression / Assessment and Plan / ED Course  I have reviewed the triage vital signs and the nursing notes.  Pertinent labs & imaging results that were available during my care of the patient were reviewed by me and considered in my medical decision making (see chart for details).        Patient presenting with gradual onset of central abdominal pain that began last night. Associated symptoms include nausea without vomiting. Patient is nontoxic, nonseptic appearing. Abdomen is soft w generalized tenderness, no guarding or rebound.   Labs, imaging and vitals reviewed. Mild leukocytosis. CT abd with findings consistent with enteritis. No hx of crohn disease however pt states she has had similar sx in the past. Will tx with abx and provide GI referral.  Patient does not meet the SIRS or Sepsis criteria. Patient's pain and other symptoms adequately managed in emergency department. Patient discharged home with symptomatic treatment and given strict instructions for follow-up with their primary care physician.  Pt safe for discharge.  Discussed results, findings, treatment and follow up. Patient advised of return precautions. Patient verbalized understanding and agreed with plan.   Final Clinical Impressions(s) / ED Diagnoses   Final diagnoses:  Enteritis    ED Discharge Orders         Ordered    ciprofloxacin (CIPRO) 500 MG tablet  2 times daily     05/22/19 1435    metroNIDAZOLE (FLAGYL) 500 MG tablet  3 times daily     05/22/19 1435    HYDROcodone-acetaminophen (NORCO/VICODIN) 5-325 MG tablet  Every 6 hours PRN     05/22/19 1435    ondansetron  (ZOFRAN ODT) 4 MG disintegrating tablet  Every 8 hours PRN     05/22/19 1435           , [place] N, PA-C 05/22/19 1503    Cathren LaineSteinl, Kevin, MD 05/22/19 1726

## 2019-07-19 ENCOUNTER — Emergency Department (HOSPITAL_COMMUNITY)
Admission: EM | Admit: 2019-07-19 | Discharge: 2019-07-19 | Disposition: A | Discharge status: Home / Self Care | Payer: Medicaid Other | Attending: Emergency Medicine | Admitting: Emergency Medicine

## 2019-07-19 ENCOUNTER — Emergency Department (HOSPITAL_COMMUNITY): Payer: Medicaid Other

## 2019-07-19 ENCOUNTER — Other Ambulatory Visit: Payer: Self-pay

## 2019-07-19 ENCOUNTER — Encounter (HOSPITAL_COMMUNITY): Payer: Self-pay | Admitting: Emergency Medicine

## 2019-07-19 DIAGNOSIS — Z8709 Personal history of other diseases of the respiratory system: Secondary | ICD-10-CM | POA: Insufficient documentation

## 2019-07-19 DIAGNOSIS — W1830XA Fall on same level, unspecified, initial encounter: Secondary | ICD-10-CM | POA: Diagnosis not present

## 2019-07-19 DIAGNOSIS — I10 Essential (primary) hypertension: Secondary | ICD-10-CM | POA: Insufficient documentation

## 2019-07-19 DIAGNOSIS — M79645 Pain in left finger(s): Secondary | ICD-10-CM | POA: Diagnosis present

## 2019-07-19 DIAGNOSIS — S8392XA Sprain of unspecified site of left knee, initial encounter: Secondary | ICD-10-CM

## 2019-07-19 DIAGNOSIS — Y92511 Restaurant or cafe as the place of occurrence of the external cause: Secondary | ICD-10-CM | POA: Insufficient documentation

## 2019-07-19 DIAGNOSIS — Y9301 Activity, walking, marching and hiking: Secondary | ICD-10-CM | POA: Diagnosis not present

## 2019-07-19 DIAGNOSIS — R0789 Other chest pain: Secondary | ICD-10-CM | POA: Diagnosis not present

## 2019-07-19 DIAGNOSIS — W19XXXA Unspecified fall, initial encounter: Secondary | ICD-10-CM

## 2019-07-19 DIAGNOSIS — F909 Attention-deficit hyperactivity disorder, unspecified type: Secondary | ICD-10-CM | POA: Insufficient documentation

## 2019-07-19 DIAGNOSIS — Z79899 Other long term (current) drug therapy: Secondary | ICD-10-CM | POA: Insufficient documentation

## 2019-07-19 DIAGNOSIS — G8929 Other chronic pain: Secondary | ICD-10-CM | POA: Insufficient documentation

## 2019-07-19 DIAGNOSIS — Z87891 Personal history of nicotine dependence: Secondary | ICD-10-CM | POA: Insufficient documentation

## 2019-07-19 DIAGNOSIS — S63602A Unspecified sprain of left thumb, initial encounter: Secondary | ICD-10-CM | POA: Insufficient documentation

## 2019-07-19 DIAGNOSIS — M545 Low back pain, unspecified: Secondary | ICD-10-CM

## 2019-07-19 DIAGNOSIS — M25562 Pain in left knee: Secondary | ICD-10-CM | POA: Diagnosis present

## 2019-07-19 MED ORDER — METOPROLOL SUCCINATE ER 25 MG PO TB24: 25.0000 mg | ORAL_TABLET | Freq: Every day | ORAL | 0 refills | Status: AC

## 2019-07-19 MED ORDER — ATORVASTATIN CALCIUM 40 MG PO TABS: 40.0000 mg | ORAL_TABLET | Freq: Every day | ORAL | 0 refills | Status: AC

## 2019-07-19 NOTE — Discharge Instructions (Signed)
Rest - please stay off left leg as much as possible Ice - ice for 20 minutes at a time, several times a day Compression - wear brace to provide support Elevate - elevate left leg above level of heart Ibuprofen - take with food. Take up to 3-4 times daily

## 2019-07-19 NOTE — ED Triage Notes (Signed)
Pt with slip and fall on wet floor while at [employer] today. Right knee pain and right thumb swelling. No Loc.

## 2019-07-19 NOTE — ED Notes (Signed)
Pt verbalizes understanding of d/c instructions. Prescriptions reviewed with patient. Pt ambulatory at d/c on crutches with all belongings.

## 2019-07-19 NOTE — ED Provider Notes (Signed)
[facility] Provider [facility]   CSN: [id number] Arrival date & time: 07/19/19  1635     History   Chief Complaint Chief Complaint  Patient presents with  . Knee Pain  . Fall  . Finger Injury    HPI Crystal Myers is a 38 y.o. female who presents with a fall. PMH significant for asthma, obesity, PVCs, chronic chest pain. She [facility] [employer] [employer] and the employees were pulling items out of the cooler and she turned suddenly and slipped on the floor. She can't remember how she fell but states she thinks she fell on to her left side because that's where her pain is. She was able to get up and states she initially didn't have a lot of pain was just more embarrassed. She was able to walk but then by the time she went to check out she was having a lot of pain in her L knee, low back, and entire left arm especially over the thumb. She went home and then decided to come to the ED to be checked out because she was very worried. She also notes she's been out of her "heart medications" and has been unable to get in touch with her PCP or her cardiologist. She denies head injury, LOC, chest pain that's different from normal, SOB, abdominal pain, right arm or leg pain.      HPI  Past Medical History:  Diagnosis Date  . ADHD   . Anemia   . Anxiety   . Asthma   . Chlamydia   . Depression   . Headache   . Hypertension     Patient Active Problem List   Diagnosis Date Noted  . Acute coronary syndrome (HCC) 11/21/2018  . Chest pain 11/19/2018  . GERD (gastroesophageal reflux disease) 11/19/2018  . OSA (obstructive sleep apnea) 05/13/2018  . Dysphagia 05/13/2018  . Perennial and seasonal allergic rhinitis 07/08/2016  . Allergic conjunctivitis 07/08/2016  . Moderate persistent asthma 07/08/2016  . Generalized pruritus 07/08/2016  . Depression 12/24/2014  . Acute calculous cholecystitis s/p lap chole 12/24/2014 12/23/2014  . Migraine 02/11/2013     Past Surgical History:  Procedure Laterality Date  . ADENOIDECTOMY    . APPENDECTOMY    . CERVICAL BIOPSY  W/ LOOP ELECTRODE EXCISION    . CERVICAL POLYPECTOMY    . CESAREAN SECTION    . CESAREAN SECTION     x4  . CHOLECYSTECTOMY N/A 12/24/2014   Procedure: LAPAROSCOPIC CHOLECYSTECTOMY WITH INTRAOPERATIVE CHOLANGIOGRAM;  Surgeon: Glenna FellowsBenjamin Hoxworth, MD;  Location: [facility];  Service: General;  Laterality: N/A;  . DILATION AND CURETTAGE OF UTERUS    . LYMPHADENECTOMY    . TONSILLECTOMY    . TUBAL LIGATION       OB History    Gravida  5   Para  5   Term  3   Preterm  2   AB      Living  3     SAB      TAB      Ectopic      Multiple      Live Births  4            Home Medications    Prior to Admission medications   Medication Sig Start Date End Date Taking? Authorizing Provider  albuterol (PROAIR HFA) 108 (90 Base) MCG/ACT inhaler Inhale 2 puffs into the lungs every 4 (four) hours as needed for wheezing or shortness  of breath. 08/19/18   Oretha MilchAlva, Rakesh V, MD  atorvastatin (LIPITOR) 40 MG tablet Take 1 tablet (40 mg total) by mouth daily at 6 PM. 11/22/18   Orpah CobbKadakia, Ajay, MD  ciprofloxacin (CIPRO) 500 MG tablet Take 1 tablet (500 mg total) by mouth 2 (two) times daily. 05/22/19   Robinson, [place] N, PA-C  citalopram (CELEXA) 20 MG tablet Take 20 mg by mouth daily.     [provider]  EPINEPHrine (EPIPEN JR) 0.15 MG/0.3ML injection INJECT 0.3 MLS (0.15 MG TOTAL) INTO THE MUSCLE AS NEEDED FOR ANAPHYLAXIS. 12/16/18   Oretha MilchAlva, Rakesh V, MD  EPINEPHRINE 0.3 mg/0.3 mL IJ SOAJ injection INJECT 0.3 MG INTO THE MUSCLE AS NEEDED FOR ANAPHYLAXIS 03/08/19   Oretha MilchAlva, Rakesh V, MD  guanFACINE (INTUNIV) 1 MG TB24 ER tablet Take 1 mg by mouth daily. 05/13/18   [provider]  HYDROcodone-acetaminophen (NORCO/VICODIN) 5-325 MG tablet Take 1-2 tablets by mouth every 6 (six) hours as needed. 05/22/19   Robinson, [place] N, PA-C  ipratropium-albuterol (DUONEB) 0.5-2.5 (3)  MG/3ML SOLN Take 3 mLs by nebulization every 2 (two) hours as needed. Patient taking differently: Take 3 mLs by nebulization every 2 (two) hours as needed (for wheezing).  10/03/15   Barrett, Rolm GalaStevi, PA-C  lamoTRIgine (LAMICTAL) 150 MG tablet Take 150 mg by mouth daily.     [provider]  Levonorgestrel-Ethinyl Estradiol (CAMRESE) 0.15-0.03 &0.01 MG tablet Take 1 tablet by mouth daily. 05/14/18   Constant, Peggy, MD  levothyroxine (SYNTHROID, LEVOTHROID) 25 MCG tablet Take 25 mcg by mouth daily. 04/30/18   [provider]  metoprolol succinate (TOPROL-XL) 25 MG 24 hr tablet Take 1 tablet (25 mg total) by mouth daily. 11/22/18   Orpah CobbKadakia, Ajay, MD  mometasone-formoterol (DULERA) 200-5 MCG/ACT AERO Inhale 2 puffs into the lungs 2 (two) times daily. 08/19/18   Oretha MilchAlva, Rakesh V, MD  ondansetron (ZOFRAN ODT) 4 MG disintegrating tablet Take 1 tablet (4 mg total) by mouth every 8 (eight) hours as needed for nausea or vomiting. 05/22/19   Robinson, [place] N, PA-C  pantoprazole (PROTONIX) 20 MG tablet Take 20 mg by mouth daily.    [provider]  VYVANSE 40 MG capsule Take 40 mg by mouth daily. 09/01/17   [provider]  zolpidem (AMBIEN) 10 MG tablet Take 10 mg by mouth at bedtime.     [provider]    Family History Family History  Problem Relation Age of Onset  . Diabetes Mother   . Hypertension Mother   . CAD Mother   . Allergic rhinitis Mother   . Asthma Sister     Social History Social History   Tobacco Use  . Smoking status: Former Smoker    Packs/day: 0.50    Types: Cigarettes    Quit date: 11/25/2014    Years since quitting: 4.6  . Smokeless tobacco: Never Used  Substance Use Topics  . Alcohol use: No    Alcohol/week: 0.0 standard drinks    Comment: former  . Drug use: No     Allergies   Ferrous gluconate, Chantix [varenicline], Dust mite mixed allergen ext [mite (d. farinae)], Other, Oxycodone, Amoxapine, Augmentin [amoxicillin-pot  clavulanate], and Penicillins   Review of Systems Review of Systems  Respiratory: Negative for shortness of breath.   Cardiovascular: Positive for chest pain (chronic).  Gastrointestinal: Negative for abdominal pain.  Musculoskeletal: Positive for arthralgias, back pain and joint swelling. Negative for neck pain.  Skin: Negative for wound.  Neurological: Negative for headaches.  Physical Exam Updated Vital Signs BP (!) 142/110   Pulse (!) 113   Temp 98.9 F (37.2 C)   Resp 17   SpO2 100%   Physical Exam Vitals signs and nursing note reviewed.  Constitutional:      General: She is not in acute distress.    Appearance: She is well-developed. She is obese. She is not ill-appearing.  HENT:     Head: Normocephalic and atraumatic.  Eyes:     General: No scleral icterus.       Right eye: No discharge.        Left eye: No discharge.     Conjunctiva/sclera: Conjunctivae normal.     Pupils: Pupils are equal, round, and reactive to light.  Neck:     Musculoskeletal: Normal range of motion.  Cardiovascular:     Rate and Rhythm: Normal rate and regular rhythm.  Pulmonary:     Effort: Pulmonary effort is normal. No respiratory distress.     Breath sounds: Normal breath sounds.  Abdominal:     General: There is no distension.     Palpations: Abdomen is soft.     Tenderness: There is no abdominal tenderness.  Musculoskeletal:     Comments: LUE: No obvious swelling, deformity, or warmth. Tenderness over the L thumb at the MCP joint. FROM of the shoulder, elbow, wrist, fingers. N/V intact.  LLE: Swelling over the medial side of the knee with associated tenderness. Decreased ROM. Normal DP pulse  Back: Diffuse tenderness across the lower back      Skin:    General: Skin is warm and dry.  Neurological:     Mental Status: She is alert and oriented to person, place, and time.  Psychiatric:        Behavior: Behavior normal.      ED Treatments / Results  Labs (all labs  ordered are listed, but only abnormal results are displayed) Labs Reviewed - No data to display  EKG None  Radiology Dg Lumbar Spine 2-3 Views  Result Date: 07/19/2019 CLINICAL DATA:  Fall on wet floor.  Lower back pain EXAM: LUMBAR SPINE - 2-3 VIEW COMPARISON:  Radiograph May 05, 2015. FINDINGS: Rudimentary ribs at T12. 5 non-rib-bearing lumbar type vertebral bodies. No acute fracture or vertebral body height loss. Maintained intervertebral disc heights. Minimal facet changes at L5-S1. Cholecystectomy clips in the right upper quadrant. Remaining soft tissues are unremarkable. IMPRESSION: No acute fracture, vertebral body height loss or traumatic listhesis. Minimal facet degenerative changes at L5-S1. Electronically Signed   By: Kreg ShropshirePrice  DeHay M.D.   On: 07/19/2019 17:43   Dg Knee 2 Views Left  Result Date: 07/19/2019 CLINICAL DATA:  Fall on wet floor, pain and stiffness in the knee EXAM: LEFT KNEE - 1-2 VIEW COMPARISON:  None. FINDINGS: No evidence of fracture, dislocation, or joint effusion. No evidence of arthropathy or other focal bone abnormality. Soft tissues are unremarkable. IMPRESSION: Negative. Electronically Signed   By: Kreg ShropshirePrice  DeHay M.D.   On: 07/19/2019 17:40   Dg Hand Complete Left  Result Date: 07/19/2019 CLINICAL DATA:  Fall on wet floor pain along the radial aspect the hand EXAM: LEFT HAND - COMPLETE 3+ VIEW COMPARISON:  None. FINDINGS: There is no evidence of fracture or dislocation. Mild first carpometacarpal and first metacarpophalangeal arthrosis. No other focal bone abnormality. Soft tissues are unremarkable. IMPRESSION: No acute osseous abnormality. Electronically Signed   By: Kreg ShropshirePrice  DeHay M.D.   On: 07/19/2019 17:41    Procedures Procedures (including  critical care time)  Medications Ordered in ED Medications - No data to display   Initial Impression / Assessment and Plan / ED Course  I have reviewed the triage vital signs and the nursing notes.  Pertinent labs &  imaging results that were available during my care of the patient were reviewed by me and considered in my medical decision making (see chart for details  38 year old female presents with a mechanical fall and left thumb, left knee, low back pain.  She is mildly hypertensive and tachycardic which is likely from pain.  She is also been out of her metoprolol which could be contributing as well.  X-rays were obtained in triage and are all negative.  She does have some swelling of the left knee.  We will put her in a knee immobilizer and give crutches.  She is offered medication for pain and declined.  She is encouraged to follow-up with her doctor if she is not improving.  She is given prescription for cholesterol medicine and her metoprolol.  Final Clinical Impressions(s) / ED Diagnoses   Final diagnoses:  Fall, initial encounter  Sprain of left thumb, unspecified site of finger, initial encounter  Sprain of left knee, unspecified ligament, initial encounter  Acute midline low back pain without sciatica    ED Discharge Orders    None       Recardo Evangelist, PA-C 07/19/19 1936    Maudie Flakes, MD 07/20/19 1309

## 2019-08-10 ENCOUNTER — Ambulatory Visit (HOSPITAL_COMMUNITY): Payer: Medicaid Other | Admitting: Psychiatry

## 2019-08-10 ENCOUNTER — Other Ambulatory Visit: Payer: Self-pay

## 2019-08-12 ENCOUNTER — Other Ambulatory Visit: Payer: Self-pay

## 2019-08-12 ENCOUNTER — Ambulatory Visit (HOSPITAL_COMMUNITY): Payer: Medicaid Other | Admitting: Psychiatry

## 2019-09-12 ENCOUNTER — Observation Stay (HOSPITAL_COMMUNITY)
Admission: EM | Admit: 2019-09-12 | Discharge: 2019-09-14 | Disposition: A | Discharge status: Home / Self Care | Payer: Medicaid Other | Attending: Internal Medicine | Admitting: Internal Medicine

## 2019-09-12 ENCOUNTER — Other Ambulatory Visit: Payer: Self-pay

## 2019-09-12 ENCOUNTER — Emergency Department (HOSPITAL_COMMUNITY): Payer: Medicaid Other

## 2019-09-12 ENCOUNTER — Encounter (HOSPITAL_COMMUNITY): Payer: Self-pay | Admitting: Emergency Medicine

## 2019-09-12 DIAGNOSIS — F329 Major depressive disorder, single episode, unspecified: Secondary | ICD-10-CM | POA: Insufficient documentation

## 2019-09-12 DIAGNOSIS — I119 Hypertensive heart disease without heart failure: Secondary | ICD-10-CM | POA: Diagnosis not present

## 2019-09-12 DIAGNOSIS — Z8249 Family history of ischemic heart disease and other diseases of the circulatory system: Secondary | ICD-10-CM | POA: Diagnosis not present

## 2019-09-12 DIAGNOSIS — R9439 Abnormal result of other cardiovascular function study: Secondary | ICD-10-CM | POA: Insufficient documentation

## 2019-09-12 DIAGNOSIS — F909 Attention-deficit hyperactivity disorder, unspecified type: Secondary | ICD-10-CM | POA: Insufficient documentation

## 2019-09-12 DIAGNOSIS — Z885 Allergy status to narcotic agent status: Secondary | ICD-10-CM | POA: Diagnosis not present

## 2019-09-12 DIAGNOSIS — Z793 Long term (current) use of hormonal contraceptives: Secondary | ICD-10-CM | POA: Insufficient documentation

## 2019-09-12 DIAGNOSIS — R739 Hyperglycemia, unspecified: Secondary | ICD-10-CM | POA: Diagnosis not present

## 2019-09-12 DIAGNOSIS — I10 Essential (primary) hypertension: Secondary | ICD-10-CM | POA: Diagnosis present

## 2019-09-12 DIAGNOSIS — K589 Irritable bowel syndrome without diarrhea: Secondary | ICD-10-CM | POA: Insufficient documentation

## 2019-09-12 DIAGNOSIS — Z79890 Hormone replacement therapy (postmenopausal): Secondary | ICD-10-CM | POA: Diagnosis not present

## 2019-09-12 DIAGNOSIS — Z881 Allergy status to other antibiotic agents status: Secondary | ICD-10-CM | POA: Diagnosis not present

## 2019-09-12 DIAGNOSIS — K219 Gastro-esophageal reflux disease without esophagitis: Secondary | ICD-10-CM | POA: Diagnosis not present

## 2019-09-12 DIAGNOSIS — Z888 Allergy status to other drugs, medicaments and biological substances status: Secondary | ICD-10-CM | POA: Insufficient documentation

## 2019-09-12 DIAGNOSIS — Z880 Allergy status to penicillin: Secondary | ICD-10-CM | POA: Insufficient documentation

## 2019-09-12 DIAGNOSIS — R079 Chest pain, unspecified: Secondary | ICD-10-CM | POA: Diagnosis not present

## 2019-09-12 DIAGNOSIS — Z7951 Long term (current) use of inhaled steroids: Secondary | ICD-10-CM | POA: Diagnosis not present

## 2019-09-12 DIAGNOSIS — Z87891 Personal history of nicotine dependence: Secondary | ICD-10-CM | POA: Insufficient documentation

## 2019-09-12 DIAGNOSIS — I493 Ventricular premature depolarization: Secondary | ICD-10-CM | POA: Diagnosis not present

## 2019-09-12 DIAGNOSIS — I249 Acute ischemic heart disease, unspecified: Secondary | ICD-10-CM | POA: Diagnosis present

## 2019-09-12 DIAGNOSIS — J4540 Moderate persistent asthma, uncomplicated: Secondary | ICD-10-CM | POA: Insufficient documentation

## 2019-09-12 DIAGNOSIS — Z20828 Contact with and (suspected) exposure to other viral communicable diseases: Secondary | ICD-10-CM | POA: Diagnosis not present

## 2019-09-12 DIAGNOSIS — Z825 Family history of asthma and other chronic lower respiratory diseases: Secondary | ICD-10-CM | POA: Insufficient documentation

## 2019-09-12 DIAGNOSIS — Z79899 Other long term (current) drug therapy: Secondary | ICD-10-CM | POA: Insufficient documentation

## 2019-09-12 DIAGNOSIS — F419 Anxiety disorder, unspecified: Secondary | ICD-10-CM | POA: Insufficient documentation

## 2019-09-12 HISTORY — DX: Chest pain, unspecified: R07.9

## 2019-09-12 LAB — BASIC METABOLIC PANEL
Anion gap: 12 (ref 5–15)
BUN: 8 mg/dL (ref 6–20)
CO2: 22 mmol/L (ref 22–32)
Calcium: 9.2 mg/dL (ref 8.9–10.3)
Chloride: 104 mmol/L (ref 98–111)
Creatinine, Ser: 0.88 mg/dL (ref 0.44–1.00)
GFR calc Af Amer: >60 mL/min (ref >60)
GFR calc non Af Amer: >60 mL/min (ref >60)
Glucose, Bld: 112 mg/dL — ABNORMAL HIGH (ref 70–99)
Potassium: 3.6 mmol/L (ref 3.5–5.1)
Sodium: 138 mmol/L (ref 135–145)

## 2019-09-12 LAB — CBC
HCT: 41.4 % (ref 36.0–46.0)
Hemoglobin: 12.5 g/dL (ref 12.0–15.0)
MCH: 19.4 pg — ABNORMAL LOW (ref 26.0–34.0)
MCHC: 30.2 g/dL (ref 30.0–36.0)
MCV: 64.3 fL — ABNORMAL LOW (ref 80.0–100.0)
Platelets: 337 10*3/uL (ref 150–400)
RBC: 6.44 MIL/uL — ABNORMAL HIGH (ref 3.87–5.11)
RDW: 17.5 % — ABNORMAL HIGH (ref 11.5–15.5)
WBC: 7.8 10*3/uL (ref 4.0–10.5)
nRBC: 0 % (ref 0.0–0.2)

## 2019-09-12 LAB — TROPONIN I (HIGH SENSITIVITY)
Troponin I (High Sensitivity): <2 ng/L (ref <18)
Troponin I (High Sensitivity): <2 ng/L (ref <18)

## 2019-09-12 LAB — MAGNESIUM: Magnesium: 2.3 mg/dL (ref 1.7–2.4)

## 2019-09-12 LAB — TSH: TSH: 2.952 u[IU]/mL (ref 0.350–4.500)

## 2019-09-12 LAB — I-STAT BETA HCG BLOOD, ED (MC, WL, AP ONLY): I-stat hCG, quantitative: <5 m[IU]/mL (ref <5)

## 2019-09-12 LAB — D-DIMER, QUANTITATIVE: D-Dimer, Quant: 0.47 ug/mL-FEU (ref 0.00–0.50)

## 2019-09-12 MED ORDER — ENOXAPARIN SODIUM 40 MG/0.4ML ~~LOC~~ SOLN
40.0000 mg | SUBCUTANEOUS | Status: DC
  Filled 2019-09-12: qty 0.4

## 2019-09-12 MED ORDER — NITROGLYCERIN 0.4 MG SL SUBL: 0.4000 mg | SUBLINGUAL_TABLET | SUBLINGUAL | Status: DC | PRN

## 2019-09-12 MED ORDER — SODIUM CHLORIDE 0.9% FLUSH: 3.0000 mL | Freq: Once | INTRAVENOUS | Status: DC

## 2019-09-12 MED ORDER — ALBUTEROL SULFATE (2.5 MG/3ML) 0.083% IN NEBU: 3.0000 mL | INHALATION_SOLUTION | RESPIRATORY_TRACT | Status: DC | PRN

## 2019-09-12 MED ORDER — METOPROLOL SUCCINATE ER 25 MG PO TB24
25.0000 mg | ORAL_TABLET | Freq: Every day | ORAL | Status: DC
  Administered 2019-09-13 – 2019-09-14 (×2): 25 mg via ORAL
  Filled 2019-09-12 (×2): qty 1

## 2019-09-12 MED ORDER — ASPIRIN 81 MG PO CHEW
324.0000 mg | CHEWABLE_TABLET | Freq: Once | ORAL | Status: AC
  Administered 2019-09-12: 324 mg via ORAL
  Filled 2019-09-12: qty 4

## 2019-09-12 MED ORDER — ATORVASTATIN CALCIUM 40 MG PO TABS
40.0000 mg | ORAL_TABLET | Freq: Every day | ORAL | Status: DC
  Administered 2019-09-13: 40 mg via ORAL
  Filled 2019-09-12: qty 1

## 2019-09-12 MED ORDER — ASPIRIN EC 81 MG PO TBEC
81.0000 mg | DELAYED_RELEASE_TABLET | Freq: Every day | ORAL | Status: DC
  Administered 2019-09-13 – 2019-09-14 (×2): 81 mg via ORAL
  Filled 2019-09-12 (×2): qty 1

## 2019-09-12 MED ORDER — HEPARIN BOLUS VIA INFUSION
4000.0000 [IU] | Freq: Once | INTRAVENOUS | Status: AC
  Administered 2019-09-12: 4000 [IU] via INTRAVENOUS
  Filled 2019-09-12: qty 4000

## 2019-09-12 MED ORDER — ACETAMINOPHEN 325 MG PO TABS
650.0000 mg | ORAL_TABLET | ORAL | Status: DC | PRN
  Administered 2019-09-13: 650 mg via ORAL
  Filled 2019-09-12: qty 2

## 2019-09-12 MED ORDER — ACETAMINOPHEN 325 MG PO TABS
650.0000 mg | ORAL_TABLET | Freq: Once | ORAL | Status: AC
  Administered 2019-09-12: 650 mg via ORAL
  Filled 2019-09-12: qty 2

## 2019-09-12 MED ORDER — MORPHINE SULFATE (PF) 2 MG/ML IV SOLN
2.0000 mg | Freq: Once | INTRAVENOUS | Status: AC
  Administered 2019-09-12: 2 mg via INTRAVENOUS
  Filled 2019-09-12: qty 1

## 2019-09-12 MED ORDER — HEPARIN (PORCINE) 25000 UT/250ML-% IV SOLN
1400.0000 [IU]/h | INTRAVENOUS | Status: DC
  Administered 2019-09-12: 1000 [IU]/h via INTRAVENOUS
  Filled 2019-09-12: qty 250

## 2019-09-12 MED ORDER — BACLOFEN 5 MG HALF TABLET
10.0000 mg | ORAL_TABLET | Freq: Three times a day (TID) | ORAL | Status: DC | PRN
  Administered 2019-09-13: 10 mg via ORAL
  Filled 2019-09-12 (×2): qty 1

## 2019-09-12 MED ORDER — ONDANSETRON HCL 4 MG/2ML IJ SOLN
4.0000 mg | Freq: Four times a day (QID) | INTRAMUSCULAR | Status: DC | PRN
  Administered 2019-09-13: 4 mg via INTRAVENOUS
  Filled 2019-09-12: qty 2

## 2019-09-12 MED ORDER — HYDROMORPHONE HCL 1 MG/ML IJ SOLN
0.5000 mg | INTRAMUSCULAR | Status: AC | PRN
  Administered 2019-09-12 – 2019-09-13 (×3): 0.5 mg via INTRAVENOUS
  Filled 2019-09-12 (×3): qty 1

## 2019-09-12 NOTE — H&P (Signed)
History and Physical    Crystal Myers YQM:[id number] DOB: 05-Oct-1981 DOA: 09/12/2019  PCP: Levin Bacon, NP  Patient coming from: Home  I have personally briefly reviewed patient's old medical records in [facility]  Chief Complaint: Chest pain  HPI: Crystal Myers is a 38 y.o. female with medical history significant for asthma, hypertension, frequent PVCs, migraines who presents to the ED for evaluation of chest pain.  Patient states she has been having ongoing issues with intermittent chest pain and palpitations for about 1 year now.She had a NM myocardial perfusion stress test on 11/21/2018 which was low risk without reversible ischemia or infarction.  She has recently been seen by cardiology at [facility] and underwent transthoracic echocardiogram on 09/06/2019 which showed EF 60-65%, aortic valve sclerosis, no significant mitral valve stenosis or regurgitation, no pericardial effusion, and normal left ventricular wall motion.  She says yesterday while grocery shopping she developed a sudden onset central sharp chest pain which had transiently radiated to her left arm.  She felt a heaviness in her left arm.  The central chest pain has been a constant presence since then with intermittent episodes of worsening.  Symptoms worsen with activity and slightly improved with rest.  She has been having continued palpitations and occasional shortness of breath.  She reports radiation of the pain to her neck and jaw in the past.  She denies any associated fevers, chills, diaphoresis, nausea, vomiting.  She reports drinking up to 12 sodas per day earlier this year and she has been cutting back and is down to 2 sodas per day.  She reports a history of snoring and a remote sleep study which did not show significant sleep apnea.  She does say she is scheduled for repeat sleep study next month.  She denies any alcohol use.  She is a former smoker with approximately 20 pack years and quit 5-6 years ago.   She reports multiple family members with history of heart disease including a cousin who passed in his sleep apparently from heart disease and an aunt who has required a heart transplant.  She says the only medications she is currently taking her metoprolol, vitamin D supplement once weekly, and an unspecified medication for IBS.  In the past she was taking Lamictal and Vyvanse for what was considered bipolar disorder and/or ADHD however states that she is no longer requiring these.  ED Course:  Initial vitals showed BP 150/96, pulse 124, RR 19, temp 99.4 Fahrenheit, SPO2 98% on room air.  Labs notable for sodium 138, potassium 3.6, bicarb 22, BUN 8, creatinine 0.88, WBC 7.8, hemoglobin 12.5, platelets 337,000, i-STAT beta-hCG <5.0,, D-dimer 0.47, high-sensitivity troponin I <2 x 2.  SARS-CoV-2 test was obtained and pending.  2 view chest x-ray was negative for focal consolidation, edema, or effusion.  EKG showed sinus tachycardia with frequent PVCs, right faster when compared to prior.  Patient was given aspirin 324 mg, Tylenol, and IV morphine.  EDP discussed the case with patient's cardiologist, Dr. Doylene Canard, who will consult.  Due to heart score of 5 the hospitalist service was consulted to admit for further evaluation management.  Review of Systems: All systems reviewed and are negative except as documented in history of present illness above.   Past Medical History:  Diagnosis Date   ADHD    Anemia    Anxiety    Asthma    Chlamydia    Depression    Headache    Hypertension  Past Surgical History:  Procedure Laterality Date   ADENOIDECTOMY     APPENDECTOMY     CERVICAL BIOPSY  W/ LOOP ELECTRODE EXCISION     CERVICAL POLYPECTOMY     CESAREAN SECTION     CESAREAN SECTION     x4   CHOLECYSTECTOMY N/A 12/24/2014   Procedure: LAPAROSCOPIC CHOLECYSTECTOMY WITH INTRAOPERATIVE CHOLANGIOGRAM;  Surgeon: Glenna FellowsBenjamin Hoxworth, MD;  Location: [facility];  Service: General;   Laterality: N/A;   DILATION AND CURETTAGE OF UTERUS     LYMPHADENECTOMY     TONSILLECTOMY     TUBAL LIGATION      Social History:  reports that she quit smoking about 4 years ago. Her smoking use included cigarettes. She smoked 0.50 packs per day. She has never used smokeless tobacco. She reports that she does not drink alcohol or use drugs.  Allergies  Allergen Reactions   Ferrous Gluconate Nausea Only   Chantix [Varenicline] Other (See Comments)    Nightmare    Dust Mite Mixed Allergen Ext [Mite (D. Farinae)]    Other     GRASS and POLLEN    Oxycodone Itching   Amoxapine Itching   Augmentin [Amoxicillin-Pot Clavulanate] Itching    Has patient had a PCN reaction causing immediate rash, facial/tongue/throat swelling, SOB or lightheadedness with hypotension: Yes Has patient had a PCN reaction causing severe rash involving mucus membranes or skin necrosis: Yes Has patient had a PCN reaction that required hospitalization No Has patient had a PCN reaction occurring within the last 10 years: No If all of the above answers are "NO", then may proceed with Cephalosporin use.    Penicillins Rash    Has patient had a PCN reaction causing immediate rash, facial/tongue/throat swelling, SOB or lightheadedness with hypotension: No Has patient had a PCN reaction causing severe rash involving mucus membranes or skin necrosis: No Has patient had a PCN reaction that required hospitalization No Has patient had a PCN reaction occurring within the last 10 years: No If all of the above answers are "NO", then may proceed with Cephalosporin use.     Family History  Problem Relation Age of Onset   Diabetes Mother    Hypertension Mother    CAD Mother    Allergic rhinitis Mother    Asthma Sister      Prior to Admission medications   Medication Sig Start Date End Date Taking? Authorizing Provider  albuterol (PROAIR HFA) 108 (90 Base) MCG/ACT inhaler Inhale 2 puffs into the lungs  every 4 (four) hours as needed for wheezing or shortness of breath. 08/19/18   Oretha MilchAlva, Rakesh V, MD  atorvastatin (LIPITOR) 40 MG tablet Take 1 tablet (40 mg total) by mouth daily at 6 PM. 07/19/19   Jefm BryantGekas, Jory SimsKelly Marie, PA-C  ciprofloxacin (CIPRO) 500 MG tablet Take 1 tablet (500 mg total) by mouth 2 (two) times daily. 05/22/19   Robinson, [place] N, PA-C  citalopram (CELEXA) 20 MG tablet Take 20 mg by mouth daily.     [provider]  EPINEPHrine (EPIPEN JR) 0.15 MG/0.3ML injection INJECT 0.3 MLS (0.15 MG TOTAL) INTO THE MUSCLE AS NEEDED FOR ANAPHYLAXIS. 12/16/18   Oretha MilchAlva, Rakesh V, MD  EPINEPHRINE 0.3 mg/0.3 mL IJ SOAJ injection INJECT 0.3 MG INTO THE MUSCLE AS NEEDED FOR ANAPHYLAXIS 03/08/19   Oretha MilchAlva, Rakesh V, MD  guanFACINE (INTUNIV) 1 MG TB24 ER tablet Take 1 mg by mouth daily. 05/13/18   [provider]  HYDROcodone-acetaminophen (NORCO/VICODIN) 5-325 MG tablet Take 1-2 tablets by mouth  every 6 (six) hours as needed. 05/22/19   Robinson, [place] N, PA-C  ipratropium-albuterol (DUONEB) 0.5-2.5 (3) MG/3ML SOLN Take 3 mLs by nebulization every 2 (two) hours as needed. Patient taking differently: Take 3 mLs by nebulization every 2 (two) hours as needed (for wheezing).  10/03/15   Barrett, Rolm Gala, PA-C  lamoTRIgine (LAMICTAL) 150 MG tablet Take 150 mg by mouth daily.     [provider]  Levonorgestrel-Ethinyl Estradiol (CAMRESE) 0.15-0.03 &0.01 MG tablet Take 1 tablet by mouth daily. 05/14/18   Constant, Peggy, MD  levothyroxine (SYNTHROID, LEVOTHROID) 25 MCG tablet Take 25 mcg by mouth daily. 04/30/18   [provider]  metoprolol succinate (TOPROL-XL) 25 MG 24 hr tablet Take 1 tablet (25 mg total) by mouth daily. 07/19/19   Bethel Born, PA-C  mometasone-formoterol (DULERA) 200-5 MCG/ACT AERO Inhale 2 puffs into the lungs 2 (two) times daily. 08/19/18   Oretha Milch, MD  ondansetron (ZOFRAN ODT) 4 MG disintegrating tablet Take 1 tablet (4 mg total) by mouth every 8 (eight)  hours as needed for nausea or vomiting. 05/22/19   Robinson, [place] N, PA-C  pantoprazole (PROTONIX) 20 MG tablet Take 20 mg by mouth daily.    [provider]  VYVANSE 40 MG capsule Take 40 mg by mouth daily. 09/01/17   [provider]  zolpidem (AMBIEN) 10 MG tablet Take 10 mg by mouth at bedtime.     [provider]    Physical Exam: Vitals:   09/12/19 1634 09/12/19 1700 09/12/19 1715 09/12/19 1945  BP: (!) 141/66 (!) 106/53 122/74 110/85  Pulse: 88 (!) 41 94 (!) 52  Resp: (!) 26 (!) 22 (!) 21 (!) 25  Temp:      TempSrc:      SpO2: 98% 97% 100% 98%  Weight:      Height:        Constitutional: Obese woman resting supine in bed, NAD, calm, comfortable Eyes: PERRL, lids and conjunctivae normal ENMT: Mucous membranes are moist. Posterior pharynx clear of any exudate or lesions.Normal dentition.  Neck: normal, supple, no masses. Respiratory: clear to auscultation bilaterally, no wheezing, no crackles. Normal respiratory effort. No accessory muscle use.  Cardiovascular: Irregularly irregular, no murmurs / rubs / gallops. No extremity edema. 2+ pedal pulses. Abdomen: no tenderness, no masses palpated. No hepatosplenomegaly. Bowel sounds positive.  Musculoskeletal: Tender palpation over the sternum, no clubbing / cyanosis. No joint deformity upper and lower extremities. Good ROM, no contractures. Normal muscle tone.  Skin: no rashes, lesions, ulcers. No induration Neurologic: CN 2-12 grossly intact. Sensation intact,Strength 5/5 in all 4.  Psychiatric: Normal judgment and insight. Alert and oriented x 3. Normal mood.     Labs on Admission: I have personally reviewed following labs and imaging studies  CBC: Recent Labs  Lab 09/12/19 1458  WBC 7.8  HGB 12.5  HCT 41.4  MCV 64.3*  PLT 337   Basic Metabolic Panel: Recent Labs  Lab 09/12/19 1458  NA 138  K 3.6  CL 104  CO2 22  GLUCOSE 112*  BUN 8  CREATININE 0.88  CALCIUM 9.2   GFR: Estimated  Creatinine Clearance: 118 mL/min (by C-G formula based on SCr of 0.88 mg/dL). Liver Function Tests: No results for input(s): AST, ALT, ALKPHOS, BILITOT, PROT, ALBUMIN in the last 168 hours. No results for input(s): LIPASE, AMYLASE in the last 168 hours. No results for input(s): AMMONIA in the last 168 hours. Coagulation Profile: No results for input(s): INR, PROTIME in  the last 168 hours. Cardiac Enzymes: No results for input(s): CKTOTAL, CKMB, CKMBINDEX, TROPONINI in the last 168 hours. BNP (last 3 results) Recent Labs    11/19/18 1234  PROBNP 41.0   HbA1C: No results for input(s): HGBA1C in the last 72 hours. CBG: No results for input(s): GLUCAP in the last 168 hours. Lipid Profile: No results for input(s): CHOL, HDL, LDLCALC, TRIG, CHOLHDL, LDLDIRECT in the last 72 hours. Thyroid Function Tests: No results for input(s): TSH, T4TOTAL, FREET4, T3FREE, THYROIDAB in the last 72 hours. Anemia Panel: No results for input(s): VITAMINB12, FOLATE, FERRITIN, TIBC, IRON, RETICCTPCT in the last 72 hours. Urine analysis:    Component Value Date/Time   COLORURINE YELLOW 05/22/2019 1230   APPEARANCEUR CLEAR 05/22/2019 1230   LABSPEC 1.016 05/22/2019 1230   PHURINE 5.0 05/22/2019 1230   GLUCOSEU NEGATIVE 05/22/2019 1230   HGBUR NEGATIVE 05/22/2019 1230   BILIRUBINUR NEGATIVE 05/22/2019 1230   KETONESUR NEGATIVE 05/22/2019 1230   PROTEINUR NEGATIVE 05/22/2019 1230   UROBILINOGEN 0.2 07/07/2015 0124   NITRITE NEGATIVE 05/22/2019 1230   LEUKOCYTESUR NEGATIVE 05/22/2019 1230    Radiological Exams on Admission: Dg Chest 2 View  Result Date: 09/12/2019 CLINICAL DATA:  Sharp chest pain.  Left arm numbness. EXAM: CHEST - 2 VIEW COMPARISON:  None. FINDINGS: The heart size and mediastinal contours are within normal limits. Both lungs are clear. The visualized skeletal structures are unremarkable. IMPRESSION: No active cardiopulmonary disease. Electronically Signed   By: Gerome Sam III M.D    On: 09/12/2019 15:39    EKG: Independently reviewed.  Sinus tachycardia with frequent PVCs, rate is faster when compared to prior.  Assessment/Plan Principal Problem:   Chest pain Active Problems:   Moderate persistent asthma   Essential hypertension  Crystal Myers is a 38 y.o. female with medical history significant for asthma, hypertension, frequent PVCs, migraines who is admitted for chest pain rule out.  Chest pain: Patient with typical anginal type symptoms with negative troponins x2.  EKG shows sinus tachycardia with frequent PVCs.  Recent echocardiogram was unremarkable.  Prior stress test December 2019 was low risk.  She does have reproducible musculoskeletal chest pain.  Suspect also some component of stress/anxiety. -Monitor on telemetry -Continue on metoprolol, continue aspirin -Cardiology to see for further recommendations  Frequent PVCs: Recent echo without significant abnormality.  He has report significant thing intake.  Suspect some component of underlying sleep apnea.  She denies alcohol use. -Continue metoprolol -Check TSH, magnesium  Hypertension: Continue metoprolol.  Asthma: No active wheezing or respiratory distress.  Continue albuterol as needed.  DVT prophylaxis: Lovenox Code Status: Full code, confirmed with patient Family Communication: Patient has discussed with family Disposition Plan: Likely discharge to home in 1-2 days Consults called: Cardiology, Dr. Algie Coffer to see Admission status: Observation   Darreld Mclean MD [facility]  If 7PM-7AM, please contact night-coverage [email or web address]  09/12/2019, 8:31 PM

## 2019-09-12 NOTE — ED Triage Notes (Signed)
Pt reports sharp chest pain and left arm numbness since yesterday. States she sees a cardiologist at [place] lightheadedness.

## 2019-09-12 NOTE — ED Provider Notes (Signed)
[facility] EMERGENCY DEPARTMENT Provider Note   CSN: [id number] Arrival date & time: 09/12/19  1448     History   Chief Complaint Chief Complaint  Patient presents with  . Chest Pain    HPI Crystal Myers is a 38 y.o. female with history of ACS, GERD, OSA, asthma, migraines, hypertension, hyperlipidemia presents today for chest pain.  Chest pain beginning yesterday, constant squeezing sensation in center of chest radiating to left arm worse with exertion without clear aggravating symptoms, no medications prior to arrival, associated with nausea without vomiting and shortness of breath.  Additionally patient reports tingling in the left hand when pain is at its greatest.  Denies fever/chills, headache/vision changes, abdominal pain, vomiting/diarrhea, fall/injury, extremity pain/swelling or any additional concerns.    HPI  Past Medical History:  Diagnosis Date  . ADHD   . Anemia   . Anxiety   . Asthma   . Chlamydia   . Depression   . Headache   . Hypertension     Patient Active Problem List   Diagnosis Date Noted  . Essential hypertension 09/12/2019  . Acute coronary syndrome ([place]) 11/21/2018  . Chest pain 11/19/2018  . GERD (gastroesophageal reflux disease) 11/19/2018  . OSA (obstructive sleep apnea) 05/13/2018  . Dysphagia 05/13/2018  . Perennial and seasonal allergic rhinitis 07/08/2016  . Allergic conjunctivitis 07/08/2016  . Moderate persistent asthma 07/08/2016  . Generalized pruritus 07/08/2016  . Depression 12/24/2014  . Acute calculous cholecystitis s/p lap chole 12/24/2014 12/23/2014  . Migraine 02/11/2013    Past Surgical History:  Procedure Laterality Date  . ADENOIDECTOMY    . APPENDECTOMY    . CERVICAL BIOPSY  W/ LOOP ELECTRODE EXCISION    . CERVICAL POLYPECTOMY    . CESAREAN SECTION    . CESAREAN SECTION     x4  . CHOLECYSTECTOMY N/A 12/24/2014   Procedure: LAPAROSCOPIC CHOLECYSTECTOMY WITH INTRAOPERATIVE CHOLANGIOGRAM;   Surgeon: Excell Seltzer, MD;  Location: [facility];  Service: General;  Laterality: N/A;  . DILATION AND CURETTAGE OF UTERUS    . LYMPHADENECTOMY    . TONSILLECTOMY    . TUBAL LIGATION       OB History    Gravida  5   Para  5   Term  3   Preterm  2   AB      Living  3     SAB      TAB      Ectopic      Multiple      Live Births  4            Home Medications    Prior to Admission medications   Medication Sig Start Date End Date Taking? Authorizing Provider  albuterol (PROAIR HFA) 108 (90 Base) MCG/ACT inhaler Inhale 2 puffs into the lungs every 4 (four) hours as needed for wheezing or shortness of breath. 08/19/18   Rigoberto Noel, MD  atorvastatin (LIPITOR) 40 MG tablet Take 1 tablet (40 mg total) by mouth daily at 6 PM. 07/19/19   Ruthann Cancer, Jesse Fall, PA-C  ciprofloxacin (CIPRO) 500 MG tablet Take 1 tablet (500 mg total) by mouth 2 (two) times daily. 05/22/19   Robinson, [place] N, PA-C  citalopram (CELEXA) 20 MG tablet Take 20 mg by mouth daily.     [provider]  EPINEPHrine (EPIPEN JR) 0.15 MG/0.3ML injection INJECT 0.3 MLS (0.15 MG TOTAL) INTO THE MUSCLE AS NEEDED FOR ANAPHYLAXIS. 12/16/18   Rigoberto Noel,  MD  EPINEPHRINE 0.3 mg/0.3 mL IJ SOAJ injection INJECT 0.3 MG INTO THE MUSCLE AS NEEDED FOR ANAPHYLAXIS 03/08/19   Oretha Milch, MD  guanFACINE (INTUNIV) 1 MG TB24 ER tablet Take 1 mg by mouth daily. 05/13/18   [provider]  HYDROcodone-acetaminophen (NORCO/VICODIN) 5-325 MG tablet Take 1-2 tablets by mouth every 6 (six) hours as needed. 05/22/19   Robinson, [place] N, PA-C  ipratropium-albuterol (DUONEB) 0.5-2.5 (3) MG/3ML SOLN Take 3 mLs by nebulization every 2 (two) hours as needed. Patient taking differently: Take 3 mLs by nebulization every 2 (two) hours as needed (for wheezing).  10/03/15   Barrett, Rolm Gala, PA-C  lamoTRIgine (LAMICTAL) 150 MG tablet Take 150 mg by mouth daily.     [provider]  Levonorgestrel-Ethinyl Estradiol  (CAMRESE) 0.15-0.03 &0.01 MG tablet Take 1 tablet by mouth daily. 05/14/18   Constant, Peggy, MD  levothyroxine (SYNTHROID, LEVOTHROID) 25 MCG tablet Take 25 mcg by mouth daily. 04/30/18   [provider]  metoprolol succinate (TOPROL-XL) 25 MG 24 hr tablet Take 1 tablet (25 mg total) by mouth daily. 07/19/19   Bethel Born, PA-C  mometasone-formoterol (DULERA) 200-5 MCG/ACT AERO Inhale 2 puffs into the lungs 2 (two) times daily. 08/19/18   Oretha Milch, MD  ondansetron (ZOFRAN ODT) 4 MG disintegrating tablet Take 1 tablet (4 mg total) by mouth every 8 (eight) hours as needed for nausea or vomiting. 05/22/19   Robinson, [place] N, PA-C  pantoprazole (PROTONIX) 20 MG tablet Take 20 mg by mouth daily.    [provider]  VYVANSE 40 MG capsule Take 40 mg by mouth daily. 09/01/17   [provider]  zolpidem (AMBIEN) 10 MG tablet Take 10 mg by mouth at bedtime.     [provider]    Family History Family History  Problem Relation Age of Onset  . Diabetes Mother   . Hypertension Mother   . CAD Mother   . Allergic rhinitis Mother   . Asthma Sister     Social History Social History   Tobacco Use  . Smoking status: Former Smoker    Packs/day: 0.50    Types: Cigarettes    Quit date: 11/25/2014    Years since quitting: 4.8  . Smokeless tobacco: Never Used  Substance Use Topics  . Alcohol use: No    Alcohol/week: 0.0 standard drinks    Comment: former  . Drug use: No     Allergies   Ferrous gluconate, Chantix [varenicline], Dust mite mixed allergen ext [mite (d. farinae)], Other, Oxycodone, Amoxapine, Augmentin [amoxicillin-pot clavulanate], and Penicillins   Review of Systems Review of Systems Ten systems are reviewed and are negative for acute change except as noted in the HPI   Physical Exam Updated Vital Signs BP 110/85   Pulse (!) 52   Temp 99.4 F (37.4 C) (Oral)   Resp (!) 25   Ht  (1.727 m)   Wt 122 kg   SpO2 98%   BMI 40.90  kg/m   Physical Exam Constitutional:      General: She is not in acute distress.    Appearance: Normal appearance. She is well-developed. She is not ill-appearing or diaphoretic.  HENT:     Head: Normocephalic and atraumatic.     Right Ear: External ear normal.     Left Ear: External ear normal.     Nose: Nose normal.  Eyes:     General: Vision grossly intact. Gaze aligned appropriately.  Pupils: Pupils are equal, round, and reactive to light.  Neck:     Musculoskeletal: Normal range of motion.     Trachea: Trachea and phonation normal. No tracheal deviation.  Cardiovascular:     Rate and Rhythm: Normal rate and regular rhythm.     Pulses:          Radial pulses are 2+ on the right side and 2+ on the left side.       Dorsalis pedis pulses are 2+ on the right side and 2+ on the left side.     Heart sounds: Normal heart sounds.  Pulmonary:     Effort: Pulmonary effort is normal. No respiratory distress.     Breath sounds: Normal breath sounds.  Chest:     Chest wall: No deformity, tenderness or crepitus.  Abdominal:     General: There is no distension.     Palpations: Abdomen is soft.     Tenderness: There is no abdominal tenderness. There is no guarding or rebound.  Musculoskeletal: Normal range of motion.     Right lower leg: She exhibits no tenderness. No edema.     Left lower leg: She exhibits no tenderness. No edema.  Skin:    General: Skin is warm and dry.  Neurological:     Mental Status: She is alert.     GCS: GCS eye subscore is 4. GCS verbal subscore is 5. GCS motor subscore is 6.     Comments: Speech is clear and goal oriented, follows commands Major Cranial nerves without deficit, no facial droop Moves extremities without ataxia, coordination intact  Psychiatric:        Behavior: Behavior normal.    ED Treatments / Results  Labs (all labs ordered are listed, but only abnormal results are displayed) Labs Reviewed  BASIC METABOLIC PANEL - Abnormal;  Notable for the following components:      Result Value   Glucose, Bld 112 (*)    All other components within normal limits  CBC - Abnormal; Notable for the following components:   RBC 6.44 (*)    MCV 64.3 (*)    MCH 19.4 (*)    RDW 17.5 (*)    All other components within normal limits  SARS CORONAVIRUS 2 (TAT 6-24 HRS)  D-DIMER, QUANTITATIVE (NOT AT [facility])  I-STAT BETA HCG BLOOD, ED ([facility], [facility], [facility] ONLY)  TROPONIN I (HIGH SENSITIVITY)  TROPONIN I (HIGH SENSITIVITY)    EKG EKG Interpretation  Date/Time:  Sunday September 12 2019 14:56:42 EDT Ventricular Rate:  113 PR Interval:  124 QRS Duration: 90 QT Interval:  348 QTC Calculation: 477 R Axis:   43 Text Interpretation:  Sinus tachycardia with frequent Premature ventricular complexes Inferior infarct , age undetermined Anterolateral infarct , age undetermined Abnormal ECG Confirmed by Raeford Razor [phone]) on 09/12/2019 4:42:39 PM   Radiology Dg Chest 2 View  Result Date: 09/12/2019 CLINICAL DATA:  Sharp chest pain.  Left arm numbness. EXAM: CHEST - 2 VIEW COMPARISON:  None. FINDINGS: The heart size and mediastinal contours are within normal limits. Both lungs are clear. The visualized skeletal structures are unremarkable. IMPRESSION: No active cardiopulmonary disease. Electronically Signed   By: Gerome Sam III M.D   On: 09/12/2019 15:39    Procedures Procedures (including critical care time)  Medications Ordered in ED Medications  sodium chloride flush (NS) 0.9 % injection 3 mL (has no administration in time range)  acetaminophen (TYLENOL) tablet 650 mg (650 mg Oral Given 09/12/19 1733)  aspirin chewable tablet 324 mg (324 mg Oral Given 09/12/19 1733)  morphine 2 MG/ML injection 2 mg (2 mg Intravenous Given 09/12/19 1951)     Initial Impression / Assessment and Plan / ED Course  I have reviewed the triage vital signs and the nursing notes.  Pertinent labs & imaging results that were available during my care of the  patient were reviewed by me and considered in my medical decision making (see chart for details).  Clinical Course as of Sep 11 1954  Wynelle LinkSun Sep 12, 2019  [id number] Cardiology   [BM]    Clinical Course User Index [BM] Bill SalinasMorelli, Brandon A, PA-C   Transthoracic echocardiogram 09/06/2019:  SUMMARY  The left ventricular size is normal.  LV ejection fraction = 60-65%.  The right ventricle is normal in size and function.  There is aortic valve sclerosis.  The mitral valve is normal in structure and function.  No significant valvular stenosis or regurgitation.  There was insufficient TR detected to calculate RV systolic pressure.  The aortic sinus is normal size.  The inferior vena cava is mildly dilated with a decrease in inspiratory   collapse.  There is no pericardial effusion.  The left ventricular wall motion is normal.  There is no comparison study available.  ----------------------------------------------------------- Initial and delta troponin negative D-dimer within normal limits Beta-hCG negative BMP nonacute CBC nonacute Chest x-ray: IMPRESSION:  No active cardiopulmonary disease.   EKG: Sinus tachycardia with frequent Premature ventricular complexes Inferior infarct , age undetermined Anterolateral infarct , age undetermined Abnormal ECG Confirmed by Raeford RazorKohut, Stephen [phone]) on 09/12/2019 4:42:39 PM - Heart score elevated at 5 will consult hospitalist for admission for chest pain work-up.  On reevaluation patient is resting comfortably and in no acute distress.  She states understanding of care plan and is agreeable for admission. - Discussed case with Dr. Allena KatzPatel from hospitalist service who is asked for cardiology input and recommendations. - Discussed case with on-call cardiology Dr. Gwenevere AbbotMorton, discussed potential of stress test, she is contacting Dr. Allena KatzPatel. - I contacted Dr. Allena KatzPatel, has not yet heard from cardiology, Dr. Allena KatzPatel seeing patient for evaluation. - 7:38  PM: Received call from cardiology Dr. Gwenevere AbbotMorton who advises that this is a Dr. Algie CofferKadakia patient and to contact his group.,  Consult placed. - 7:50 PM: Received call from Dr. Algie CofferKadakia who will be seeing the patient.  I then spoke to Dr. Allena KatzPatel and informed him that I spoke to Dr. Algie CofferKadakia, Dr. Allena KatzPatel is seeing patient for admission. - Patient has been admitted to hospital service for further evaluation management.   Note: Portions of this report may have been transcribed using voice recognition software. Every effort was made to ensure accuracy; however, inadvertent computerized transcription errors may still be present. Final Clinical Impressions(s) / ED Diagnoses   Final diagnoses:  Chest pain, unspecified type    ED Discharge Orders    None       Elizabeth PalauMorelli, Brandon A, PA-C 09/12/19 Lurene Shadow1955    Kohut, Stephen, MD 09/13/19 [phone]

## 2019-09-12 NOTE — Consult Note (Signed)
Referring Physician: V.Patel/Marion Hazy  Crystal Myers is an 38 y.o. female.                       Chief Complaint: Chest pain  HPI: 38 years old white female with PMH of asthma, hypertension, palpitations, migraines has intermittent substernal and left precordial chest pain increased by palpation. She also has exertional dyspnea and left sided tingling and numbness and heavy feeling mostly in left arm but today heavy feeling in left leg without vision or speech problem and without headache. She did not use SL NTG. [facility] normal echocardiogram. 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  She also has h/o microcytic anemia with Thalassemia minor. She denies h/o of GI or GU bleed.  Past Medical History:  Diagnosis Date  . ADHD   . Anemia   . Anxiety   . Asthma   . Chlamydia   . Depression   . Headache   . Hypertension       Past Surgical History:  Procedure Laterality Date  . ADENOIDECTOMY    . APPENDECTOMY    . CERVICAL BIOPSY  W/ LOOP ELECTRODE EXCISION    . CERVICAL POLYPECTOMY    . CESAREAN SECTION    . CESAREAN SECTION     x4  . CHOLECYSTECTOMY N/A 12/24/2014   Procedure: LAPAROSCOPIC CHOLECYSTECTOMY WITH INTRAOPERATIVE CHOLANGIOGRAM;  Surgeon: Glenna FellowsBenjamin Hoxworth, MD;  Location: [facility];  Service: General;  Laterality: N/A;  . DILATION AND CURETTAGE OF UTERUS    . LYMPHADENECTOMY    . TONSILLECTOMY    . TUBAL LIGATION      Family History  Problem Relation Age of Onset  . Diabetes Mother   . Hypertension Mother   . CAD Mother   . Allergic rhinitis Mother   . Asthma Sister    Social History:  reports that she quit smoking about 4 years ago. Her smoking use included cigarettes. She smoked 0.50 packs per day. She has never used smokeless tobacco. She reports that she does not drink alcohol or use drugs.  Allergies:  Allergies  Allergen Reactions  . Ferrous Gluconate Nausea Only  . Adhesive [Tape]  Itching    EKG leads and clear, "plastic" tape = SEVERE ITCHING  . Penicillins Rash    Has patient had a PCN reaction causing immediate rash, facial/tongue/throat swelling, SOB or lightheadedness with hypotension: No Has patient had a PCN reaction causing severe rash involving mucus membranes or skin necrosis: No Has patient had a PCN reaction that required hospitalization No Has patient had a PCN reaction occurring within the last 10 years: No If all of the above answers are "NO", then may proceed with Cephalosporin use.   . Chantix [Varenicline] Other (See Comments)    Nightmare   . Dust Mite Mixed Allergen Ext [Mite (D. Farinae)] Itching  . Other     GRASS and POLLEN   . Oxycodone Itching  . Amoxapine Itching  . Augmentin [Amoxicillin-Pot Clavulanate] Itching    Has patient had a PCN reaction causing immediate rash, facial/tongue/throat swelling, SOB or lightheadedness with hypotension: Yes Has patient had a PCN reaction causing severe rash involving mucus membranes or skin necrosis: Yes Has patient had a PCN reaction that required hospitalization No Has patient had a PCN reaction occurring within the last 10 years: No If all of the above answers are "NO", then may proceed with Cephalosporin use.     (  Not in a hospital admission)   Results for orders placed or performed during the hospital encounter of 09/12/19 (from the past 48 hour(s))  Basic metabolic panel     Status: Abnormal   Collection Time: 09/12/19  2:58 PM  Result Value Ref Range   Sodium 138 135 - 145 mmol/L   Potassium 3.6 3.5 - 5.1 mmol/L   Chloride 104 98 - 111 mmol/L   CO2 22 22 - 32 mmol/L   Glucose, Bld 112 (H) 70 - 99 mg/dL   BUN 8 6 - 20 mg/dL   Creatinine, Ser 5.46 0.44 - 1.00 mg/dL   Calcium 9.2 8.9 - 50.3 mg/dL   GFR calc non Af Amer >60 >60 mL/min   GFR calc Af Amer >60 >60 mL/min   Anion gap 12 5 - 15    Comment: Performed at [facility], [address] [place], [place] [address]  CBC      Status: Abnormal   Collection Time: 09/12/19  2:58 PM  Result Value Ref Range   WBC 7.8 4.0 - 10.5 K/uL   RBC 6.44 (H) 3.87 - 5.11 MIL/uL   Hemoglobin 12.5 12.0 - 15.0 g/dL   HCT 81.2 75.1 - 70.0 %   MCV 64.3 (L) 80.0 - 100.0 fL   MCH 19.4 (L) 26.0 - 34.0 pg   MCHC 30.2 30.0 - 36.0 g/dL   RDW 17.4 (H) 94.4 - 96.7 %   Platelets 337 150 - 400 K/uL    Comment: REPEATED TO VERIFY   nRBC 0.0 0.0 - 0.2 %    Comment: Performed at [facility], [address] [place], [place] [address]  Troponin I (High Sensitivity)     Status: None   Collection Time: 09/12/19  2:58 PM  Result Value Ref Range   Troponin I (High Sensitivity) <2 <18 ng/L    Comment: Performed at [facility], [address] [place], [place] [address]  I-Stat beta hCG blood, ED     Status: None   Collection Time: 09/12/19  3:15 PM  Result Value Ref Range   I-stat hCG, quantitative <5.0 <5 mIU/mL   Comment 3            Comment:   GEST. AGE      CONC.  (mIU/mL)   <=1 WEEK        5 - 50     2 WEEKS       50 - 500     3 WEEKS       100 - 10,000     4 WEEKS     1,000 - 30,000        FEMALE AND NON-PREGNANT FEMALE:     LESS THAN 5 mIU/mL   D-dimer, quantitative (not at [facility])     Status: None   Collection Time: 09/12/19  5:18 PM  Result Value Ref Range   D-Dimer, Quant 0.47 0.00 - 0.50 ug/mL-FEU    Comment: (NOTE) At the manufacturer cut-off of 0.50 ug/mL FEU, this assay has been documented to exclude PE with a sensitivity and negative predictive value of 97 to 99%.  At this time, this assay has not been approved by the FDA to exclude DVT/VTE. Results should be correlated with clinical presentation. Performed at [facility], [address] [place], [place] [address]   Troponin I (High Sensitivity)     Status: None   Collection Time: 09/12/19  5:18 PM  Result  Value Ref Range   Troponin I (High Sensitivity) <2 <18 ng/L    Comment: Performed at [facility] [address], [place],  [place] [address]   Dg Chest 2 View  Result Date: 09/12/2019 CLINICAL DATA:  Sharp chest pain.  Left arm numbness. EXAM: CHEST - 2 VIEW COMPARISON:  None. FINDINGS: The heart size and mediastinal contours are within normal limits. Both lungs are clear. The visualized skeletal structures are unremarkable. IMPRESSION: No active cardiopulmonary disease. Electronically Signed   By: Dorise Bullion III M.D   On: 09/12/2019 15:39    Review Of Systems Constitutional: No fever, chills, weight loss or gain. Eyes: No vision change, wears glasses. No discharge or pain. Ears: No hearing loss, No tinnitus. Respiratory: Possible asthma, COPD, pneumonias. Possible shortness of breath. No hemoptysis. Cardiovascular: Possible chest pain, palpitation, leg edema. Gastrointestinal: No nausea, vomiting, diarrhea, constipation. No GI bleed. No hepatitis. Genitourinary: No dysuria, hematuria, kidney stone. No incontinance. Neurological: Positive headache, no stroke, seizures.  Psychiatry: No psych facility admission for anxiety, depression, suicide. No detox. Skin: No rash. Musculoskeletal: No joint pain, positive fibromyalgia. No neck pain, back pain. Lymphadenopathy: No lymphadenopathy. Hematology: No anemia or easy bruising.   Blood pressure 110/85, pulse (!) 52, temperature 99.4 F (37.4 C), temperature source Oral, resp. rate (!) 25, height 5\' 8"  (1.727 m), weight 122 kg, SpO2 98 %. Body mass index is 40.9 kg/m. General appearance: alert, cooperative, appears stated age and mild distress Head: Normocephalic, atraumatic. Eyes: Blue eyes, pink conjunctiva, corneas clear. PERRL, EOM's intact. Neck: No adenopathy, no carotid bruit, no JVD, supple, symmetrical, trachea midline and thyroid not enlarged. Resp: Clear to auscultation bilaterally. Chest wall tender on palpation sternal and left precordial areas.  Cardio: Regular rate and rhythm, S1, S2 normal, II/VI systolic murmur, no click, rub or gallop GI: Soft,  non-tender; bowel sounds normal; no organomegaly. Extremities: No edema, cyanosis or clubbing. Skin: Warm and dry.  Neurologic: Alert and oriented X 3, normal strength. Normal coordination and slow gait.  Assessment/Plan Acute coronary syndrome Chest pain, musculoskletal Exertional dyspnea Asthma ADHD Morbid obesity  Discussed NM stress test v/s cardiac cath.  Time spent: Review of old records, Lab, x-rays, EKG, other cardiac tests, examination, discussion with patient, referring physician over 70 minutes.  Birdie Riddle, MD  09/12/2019, 9:26 PM

## 2019-09-12 NOTE — Progress Notes (Signed)
ANTICOAGULATION CONSULT NOTE - Initial Consult  Pharmacy Consult for heparin Indication: chest pain/ACS  Allergies  Allergen Reactions  . Ferrous Gluconate Nausea Only  . Adhesive [Tape] Itching    EKG leads and clear, "plastic" tape = SEVERE ITCHING  . Penicillins Rash    Has patient had a PCN reaction causing immediate rash, facial/tongue/throat swelling, SOB or lightheadedness with hypotension: No Has patient had a PCN reaction causing severe rash involving mucus membranes or skin necrosis: No Has patient had a PCN reaction that required hospitalization No Has patient had a PCN reaction occurring within the last 10 years: No If all of the above answers are "NO", then may proceed with Cephalosporin use.   . Chantix [Varenicline] Other (See Comments)    Nightmare   . Dust Mite Mixed Allergen Ext [Mite (D. Farinae)] Itching  . Other     GRASS and POLLEN   . Oxycodone Itching  . Amoxapine Itching  . Augmentin [Amoxicillin-Pot Clavulanate] Itching    Has patient had a PCN reaction causing immediate rash, facial/tongue/throat swelling, SOB or lightheadedness with hypotension: Yes Has patient had a PCN reaction causing severe rash involving mucus membranes or skin necrosis: Yes Has patient had a PCN reaction that required hospitalization No Has patient had a PCN reaction occurring within the last 10 years: No If all of the above answers are "NO", then may proceed with Cephalosporin use.     Patient Measurements: Height: 5\' 8"  (172.7 cm) Weight: 269 lb (122 kg) IBW/kg (Calculated) : 63.9 Heparin Dosing Weight: 92kg  Vital Signs: Temp: 99.4 F (37.4 C) (10/18 1458) Temp Source: Oral (10/18 1458) BP: 110/85 (10/18 1945) Pulse Rate: 52 (10/18 1945)  Labs: Recent Labs    09/12/19 1458 09/12/19 1718  HGB 12.5  --   HCT 41.4  --   PLT 337  --   CREATININE 0.88  --   TROPONINIHS <2 <2    Estimated Creatinine Clearance: 118 mL/min (by C-G formula based on SCr of 0.88  mg/dL).   Medical History: Past Medical History:  Diagnosis Date  . ADHD   . Anemia   . Anxiety   . Asthma   . Chlamydia   . Depression   . Headache   . Hypertension      Assessment: 34 yoF admitted with CP. Pharmacy to start IV heparin. Trops negative, CBC wnl, no AC PTA.  Goal of Therapy:  Heparin level 0.3-0.7 units/ml Monitor platelets by anticoagulation protocol: Yes   Plan:  -Heparin 4000 units x1 -Heparin 1000 units/hr -Check 6-hr heparin level -Monitor heparin level, CBC, S/Sx bleeding daily  Arrie Senate, PharmD, BCPS Clinical Pharmacist [phone] Please check AMION for all [facility] numbers 09/12/2019

## 2019-09-13 ENCOUNTER — Observation Stay (HOSPITAL_COMMUNITY): Payer: Medicaid Other

## 2019-09-13 ENCOUNTER — Encounter (HOSPITAL_COMMUNITY): Payer: Self-pay

## 2019-09-13 DIAGNOSIS — I10 Essential (primary) hypertension: Secondary | ICD-10-CM

## 2019-09-13 DIAGNOSIS — I249 Acute ischemic heart disease, unspecified: Secondary | ICD-10-CM | POA: Diagnosis not present

## 2019-09-13 DIAGNOSIS — R079 Chest pain, unspecified: Secondary | ICD-10-CM | POA: Diagnosis not present

## 2019-09-13 DIAGNOSIS — K219 Gastro-esophageal reflux disease without esophagitis: Secondary | ICD-10-CM

## 2019-09-13 DIAGNOSIS — J4540 Moderate persistent asthma, uncomplicated: Secondary | ICD-10-CM

## 2019-09-13 LAB — BASIC METABOLIC PANEL
Anion gap: 10 (ref 5–15)
BUN: 9 mg/dL (ref 6–20)
CO2: 23 mmol/L (ref 22–32)
Calcium: 8.5 mg/dL — ABNORMAL LOW (ref 8.9–10.3)
Chloride: 106 mmol/L (ref 98–111)
Creatinine, Ser: 0.72 mg/dL (ref 0.44–1.00)
GFR calc Af Amer: >60 mL/min (ref >60)
GFR calc non Af Amer: >60 mL/min (ref >60)
Glucose, Bld: 112 mg/dL — ABNORMAL HIGH (ref 70–99)
Potassium: 4.4 mmol/L (ref 3.5–5.1)
Sodium: 139 mmol/L (ref 135–145)

## 2019-09-13 LAB — CBC
HCT: 36.2 % (ref 36.0–46.0)
Hemoglobin: 10.6 g/dL — ABNORMAL LOW (ref 12.0–15.0)
MCH: 18.6 pg — ABNORMAL LOW (ref 26.0–34.0)
MCHC: 29.3 g/dL — ABNORMAL LOW (ref 30.0–36.0)
MCV: 63.5 fL — ABNORMAL LOW (ref 80.0–100.0)
Platelets: 277 10*3/uL (ref 150–400)
RBC: 5.7 MIL/uL — ABNORMAL HIGH (ref 3.87–5.11)
RDW: 15.9 % — ABNORMAL HIGH (ref 11.5–15.5)
WBC: 7.1 10*3/uL (ref 4.0–10.5)
nRBC: 0 % (ref 0.0–0.2)

## 2019-09-13 LAB — HEMOGLOBIN A1C
Hgb A1c MFr Bld: 5.1 % (ref 4.8–5.6)
Mean Plasma Glucose: 99.67 mg/dL

## 2019-09-13 LAB — LIPID PANEL
Cholesterol: 237 mg/dL — ABNORMAL HIGH (ref 0–200)
HDL: 66 mg/dL (ref >40)
LDL Cholesterol: 113 mg/dL — ABNORMAL HIGH (ref 0–99)
Total CHOL/HDL Ratio: 3.6 RATIO
Triglycerides: 292 mg/dL — ABNORMAL HIGH (ref <150)
VLDL: 58 mg/dL — ABNORMAL HIGH (ref 0–40)

## 2019-09-13 LAB — HEPARIN LEVEL (UNFRACTIONATED)
Heparin Unfractionated: <0.1 IU/mL — ABNORMAL LOW (ref 0.30–0.70)
Heparin Unfractionated: 0.26 IU/mL — ABNORMAL LOW (ref 0.30–0.70)
Heparin Unfractionated: 0.16 IU/mL — ABNORMAL LOW (ref 0.30–0.70)

## 2019-09-13 LAB — SARS CORONAVIRUS 2 (TAT 6-24 HRS): SARS Coronavirus 2: NEGATIVE

## 2019-09-13 LAB — HIV ANTIBODY (ROUTINE TESTING W REFLEX): HIV Screen 4th Generation wRfx: NONREACTIVE

## 2019-09-13 MED ORDER — TECHNETIUM TC 99M TETROFOSMIN IV KIT
30.0000 | PACK | Freq: Once | INTRAVENOUS | Status: AC | PRN
  Administered 2019-09-13: 30 via INTRAVENOUS

## 2019-09-13 MED ORDER — SODIUM CHLORIDE 0.9 % IV SOLN
INTRAVENOUS | Status: DC
  Administered 2019-09-13 – 2019-09-14 (×2): via INTRAVENOUS

## 2019-09-13 MED ORDER — SODIUM CHLORIDE 0.9 % IV SOLN: 250.0000 mL | INTRAVENOUS | Status: DC | PRN

## 2019-09-13 MED ORDER — HEPARIN BOLUS VIA INFUSION
3000.0000 [IU] | Freq: Once | INTRAVENOUS | Status: AC
  Administered 2019-09-13: 3000 [IU] via INTRAVENOUS
  Filled 2019-09-13: qty 3000

## 2019-09-13 MED ORDER — MAGNESIUM HYDROXIDE 400 MG/5ML PO SUSP
5.0000 mL | Freq: Every day | ORAL | Status: DC | PRN
  Administered 2019-09-13: 5 mL via ORAL
  Filled 2019-09-13: qty 30

## 2019-09-13 MED ORDER — REGADENOSON 0.4 MG/5ML IV SOLN
0.4000 mg | Freq: Once | INTRAVENOUS | Status: AC
  Administered 2019-09-13: 09:00:00 0.4 mg via INTRAVENOUS
  Filled 2019-09-13: qty 5

## 2019-09-13 MED ORDER — SODIUM CHLORIDE 0.9% FLUSH
3.0000 mL | Freq: Two times a day (BID) | INTRAVENOUS | Status: DC
  Administered 2019-09-13 – 2019-09-14 (×2): 3 mL via INTRAVENOUS

## 2019-09-13 MED ORDER — HEPARIN (PORCINE) 25000 UT/250ML-% IV SOLN
1700.0000 [IU]/h | INTRAVENOUS | Status: DC
  Administered 2019-09-13: 1500 [IU]/h via INTRAVENOUS
  Filled 2019-09-13: qty 250

## 2019-09-13 MED ORDER — TECHNETIUM TC 99M TETROFOSMIN IV KIT
10.0000 | PACK | Freq: Once | INTRAVENOUS | Status: AC | PRN
  Administered 2019-09-13: 10 via INTRAVENOUS

## 2019-09-13 MED ORDER — SODIUM CHLORIDE 0.9% FLUSH: 3.0000 mL | INTRAVENOUS | Status: DC | PRN

## 2019-09-13 MED ORDER — REGADENOSON 0.4 MG/5ML IV SOLN
INTRAVENOUS | Status: AC
  Administered 2019-09-13: 0.4 mg via INTRAVENOUS
  Filled 2019-09-13: qty 5

## 2019-09-13 NOTE — Discharge Summary (Signed)
Physician Discharge Summary  Crystal Myers ZOX:[id number] DOB: Mar 26, 1981 DOA: 09/12/2019  PCP: Ronney Lion, NP  Admit date: 09/12/2019 Discharge date: 09/14/2019  Time spent: 40 minutes  Recommendations for Outpatient Follow-up:   1. Follow up with PCP 1-2 weeks for evaluation of symptoms. Evaluate BP control  Discharge Diagnoses:  Principal Problem:   Chest pain Active Problems:   Acute coronary syndrome [facility])   Essential hypertension   Moderate persistent asthma   GERD (gastroesophageal reflux disease)   Discharge Condition: stable  Diet recommendation: heart healthy  Filed Weights   09/12/19 1455 09/13/19 0203  Weight: 122 kg 122.9 kg    History of present illness:  Crystal Myers is a 38 y.o. female with medical history significant for asthma, hypertension, frequent PVCs, migraines who presented 10/18 to the ED for evaluation of chest pain.  Patient stated she had been having ongoing issues with intermittent chest pain and palpitations for about 1 year. She had a NM myocardial perfusion stress test on 11/21/2018 which was low risk without reversible ischemia or infarction.  She has recently been seen by cardiology at [facility] and underwent transthoracic echocardiogram on 09/06/2019 which showed EF 60-65%, aortic valve sclerosis, no significant mitral valve stenosis or regurgitation, no pericardial effusion, and normal left ventricular wall motion.  She reported on 10/18 while grocery shopping she developed a sudden onset central sharp chest pain which had transiently radiated to her left arm.  She felt a heaviness in her left arm.  The central chest pain had been a constant presence since then with intermittent episodes of worsening.  Symptoms worsened with activity and slightly improved with rest.  She had been having continued palpitations and occasional shortness of breath.  She reported radiation of the pain to her neck and jaw in the past.  She denied any associated  fevers, chills, diaphoresis, nausea, vomiting.  She reported drinking up to 12 sodas per day earlier this year and she has been cutting back and is down to 2 sodas per day.  She reported a history of snoring and a remote sleep study which did not show significant sleep apnea.  She reported she is scheduled for repeat sleep study next month.  She denied any alcohol use.  She is a former smoker with approximately 20 pack years and quit 5-6 years ago.  She reported multiple family members with history of heart disease including a cousin who passed in his sleep apparently from heart disease and an aunt who has required a heart transplant.  She reported the only medications she  currently takes is metoprolol, vitamin D supplement once weekly, and an unspecified medication for IBS.  In the past she was taking Lamictal and Vyvanse for what was considered bipolar disorder and/or ADHD however stated that she no longer requires these.   Hospital Course:  Chest pain: Patient with typical anginal type symptoms with negative troponins x2.  EKG shows sinus tachycardia with frequent PVCs.  Recent echocardiogram was unremarkable.  Prior stress test December 2019 was low risk.  She does have reproducible musculoskeletal chest pain.  Suspected also some component of stress/anxiety. Evaluated by cards who opine ACS and recommend stress test. Stress test revealed abnormality so left heart cath done 09/14/19 revealing no CAD. No interventions. Cards recommendations medical treatment with lifestyle modifications and cholesterol level control.   Frequent PVCs: Recent echo without significant abnormality. Has significant caffeine intake. Also likely some component of underlying sleep apnea. TSH and magnesium levels within limits  of normal.  She denied alcohol use. Continue metoprolol  Hypertension: fair control. Continue metoprolol.  Asthma: No active wheezing or respiratory distress.  Continue albuterol as  needed.   Procedures: Stress test 10/19 Left heart cath and coronary angiography 10/20 Consultations:  Dr. Doylene Canard cardiology  Discharge Exam: Vitals:   09/14/19 1145 09/14/19 1406  BP: 133/87 120/76  Pulse: 81 77  Resp: (!) 22 20  Temp:  98.8 F (37.1 C)  SpO2: 97% 100%    General: obese awake alert no acute distress Cardiovascular: rrr no mgr no LE edema Respiratory: normal effort BS clear bilaterally  Discharge Instructions   Discharge Instructions    Call MD for:  difficulty breathing, headache or visual disturbances   Complete by: As directed    Call MD for:  persistant dizziness or light-headedness   Complete by: As directed    Call MD for:  persistant nausea and vomiting   Complete by: As directed    Diet - low sodium heart healthy   Complete by: As directed    Discharge instructions   Complete by: As directed    Take medications as prescribed Follow up with PCP 1-2 weeks for evaluation of symptoms   Increase activity slowly   Complete by: As directed      Allergies as of 09/14/2019      Reactions   Ferrous Gluconate Nausea Only   Adhesive [tape] Itching   EKG leads and clear, "plastic" tape = SEVERE ITCHING   Penicillins Rash   Has patient had a PCN reaction causing immediate rash, facial/tongue/throat swelling, SOB or lightheadedness with hypotension: No Has patient had a PCN reaction causing severe rash involving mucus membranes or skin necrosis: No Has patient had a PCN reaction that required hospitalization No Has patient had a PCN reaction occurring within the last 10 years: No If all of the above answers are "NO", then may proceed with Cephalosporin use.   Chantix [varenicline] Other (See Comments)   Nightmares   Dust Mite Mixed Allergen Ext [mite (d. Farinae)] Itching   Other Itching   GRASS and POLLEN    Oxycodone Itching   Amoxapine Itching   Augmentin [amoxicillin-pot Clavulanate] Itching   Has patient had a PCN reaction causing  immediate rash, facial/tongue/throat swelling, SOB or lightheadedness with hypotension: Yes Has patient had a PCN reaction causing severe rash involving mucus membranes or skin necrosis: Yes Has patient had a PCN reaction that required hospitalization No Has patient had a PCN reaction occurring within the last 10 years: No If all of the above answers are "NO", then may proceed with Cephalosporin use.      Medication List    STOP taking these medications   ciprofloxacin 500 MG tablet Commonly known as: CIPRO   ondansetron 4 MG disintegrating tablet Commonly known as: Zofran ODT     TAKE these medications   albuterol 108 (90 Base) MCG/ACT inhaler Commonly known as: ProAir HFA Inhale 2 puffs into the lungs every 4 (four) hours as needed for wheezing or shortness of breath.   atorvastatin 40 MG tablet Commonly known as: LIPITOR Take 1 tablet (40 mg total) by mouth daily at 6 PM.   citalopram 20 MG tablet Commonly known as: CELEXA Take 20 mg by mouth daily.   EPINEPHrine 0.15 MG/0.3ML injection Commonly known as: EPIPEN JR INJECT 0.3 MLS (0.15 MG TOTAL) INTO THE MUSCLE AS NEEDED FOR ANAPHYLAXIS. What changed: Another medication with the same name was changed. Make sure you understand how  and when to take each.   EPINEPHrine 0.3 mg/0.3 mL Soaj injection Commonly known as: EPI-PEN INJECT 0.3 MG INTO THE MUSCLE AS NEEDED FOR ANAPHYLAXIS What changed: See the new instructions.   guanFACINE 1 MG Tb24 ER tablet Commonly known as: INTUNIV Take 1 mg by mouth daily.   HYDROcodone-acetaminophen 5-325 MG tablet Commonly known as: NORCO/VICODIN Take 1-2 tablets by mouth every 6 (six) hours as needed.   ibuprofen 200 MG tablet Commonly known as: ADVIL Take 800 mg by mouth every 8 (eight) hours as needed for headache or mild pain.   ipratropium-albuterol 0.5-2.5 (3) MG/3ML Soln Commonly known as: DUONEB Take 3 mLs by nebulization every 2 (two) hours as needed.    Levonorgestrel-Ethinyl Estradiol 0.15-0.03 &0.01 MG tablet Commonly known as: Camrese Take 1 tablet by mouth daily.   levothyroxine 25 MCG tablet Commonly known as: SYNTHROID Take 25 mcg by mouth daily.   metoprolol succinate 25 MG 24 hr tablet Commonly known as: TOPROL-XL Take 1 tablet (25 mg total) by mouth daily.   mometasone-formoterol 200-5 MCG/ACT Aero Commonly known as: Dulera Inhale 2 puffs into the lungs 2 (two) times daily.   nitroGLYCERIN 0.4 MG SL tablet Commonly known as: NITROSTAT Place 0.4 mg under the tongue every 5 (five) minutes as needed for chest pain.   pantoprazole 20 MG tablet Commonly known as: PROTONIX Take 20 mg by mouth daily.   traZODone 50 MG tablet Commonly known as: DESYREL Take 50 mg by mouth at bedtime as needed for sleep.   Vitamin D (Ergocalciferol) 1.25 MG (50000 UT) Caps capsule Commonly known as: DRISDOL Take 50,000 Units by mouth every Saturday.   Vyvanse 40 MG capsule Generic drug: lisdexamfetamine Take 40 mg by mouth daily.      Allergies  Allergen Reactions  . Ferrous Gluconate Nausea Only  . Adhesive [Tape] Itching    EKG leads and clear, "plastic" tape = SEVERE ITCHING  . Penicillins Rash    Has patient had a PCN reaction causing immediate rash, facial/tongue/throat swelling, SOB or lightheadedness with hypotension: No Has patient had a PCN reaction causing severe rash involving mucus membranes or skin necrosis: No Has patient had a PCN reaction that required hospitalization No Has patient had a PCN reaction occurring within the last 10 years: No If all of the above answers are "NO", then may proceed with Cephalosporin use.   . Chantix [Varenicline] Other (See Comments)    Nightmares   . Dust Mite Mixed Allergen Ext [Mite (D. Farinae)] Itching  . Other Itching    GRASS and POLLEN   . Oxycodone Itching  . Amoxapine Itching  . Augmentin [Amoxicillin-Pot Clavulanate] Itching    Has patient had a PCN reaction causing  immediate rash, facial/tongue/throat swelling, SOB or lightheadedness with hypotension: Yes Has patient had a PCN reaction causing severe rash involving mucus membranes or skin necrosis: Yes Has patient had a PCN reaction that required hospitalization No Has patient had a PCN reaction occurring within the last 10 years: No If all of the above answers are "NO", then may proceed with Cephalosporin use.    Follow-up Information    Orpah Cobb, MD. Schedule an appointment as soon as possible for a visit in 1 week(s).   Specialty: Cardiology Contact information: [address] [place] [address] [phone]        [facility].   Contact information: [address] [place] [address] [phone]  The results of significant diagnostics from this hospitalization (including imaging, microbiology, ancillary and laboratory) are listed below for reference.    Significant Diagnostic Studies: Dg Chest 2 View  Result Date: 09/12/2019 CLINICAL DATA:  Sharp chest pain.  Left arm numbness. EXAM: CHEST - 2 VIEW COMPARISON:  None. FINDINGS: The heart size and mediastinal contours are within normal limits. Both lungs are clear. The visualized skeletal structures are unremarkable. IMPRESSION: No active cardiopulmonary disease. Electronically Signed   By: Gerome Samavid  Williams III M.D   On: 09/12/2019 15:39   Nm Myocar Multi W/spect Izetta DakinW/wall Motion / Ef  Result Date: 09/13/2019 CLINICAL DATA:  Possible acute coronary syndrome. Negative troponins. EXAM: MYOCARDIAL IMAGING WITH SPECT (REST AND PHARMACOLOGIC-STRESS) GATED LEFT VENTRICULAR WALL MOTION STUDY LEFT VENTRICULAR EJECTION FRACTION TECHNIQUE: Standard myocardial SPECT imaging was performed after resting intravenous injection of 10 mCi [id number] Myoview. Subsequently, intravenous infusion of Lexiscan was performed under the supervision of the Cardiology staff. At peak effect of the  drug, 30 mCi Tc-7194m Myoview was injected intravenously and standard myocardial SPECT imaging was performed. Quantitative gated imaging was also performed to evaluate left ventricular wall motion, and estimate left ventricular ejection fraction. COMPARISON:  11/21/2018 FINDINGS: Perfusion: There is mild decreased radiotracer uptake along the anterior wall on the stress images is identified compared with the rest images. Findings are equivocal for myocardial reversibility. No fixed defects identified to suggest infarct. Wall Motion: Septal hypokinesis is identified, new from previous exam. Left Ventricular Ejection Fraction: 48 %.  Previously 54% End diastolic volume 133 ml End systolic volume 69 ml IMPRESSION: 1. Mild reversibility involving the anterior wall on the stress images, equivocal for myocardial ischemia. 2. Marked left ventricular dilatation and septal hypokinesis. 3. Left ventricular ejection fraction 48% 4. Non invasive risk stratification*: High *2012 Appropriate Use Criteria for Coronary Revascularization Focused Update: J Am Coll Cardiol. 2012;59(9):857-881. http://content.dementiazones.comonlinejacc.org/article.aspx?articleid=1201161 Electronically Signed   By: Signa Kellaylor  Stroud M.D.   On: 09/13/2019 14:28    Microbiology: Recent Results (from the past 240 hour(s))  SARS CORONAVIRUS 2 (TAT 6-24 HRS) Nasopharyngeal Nasopharyngeal Swab     Status: None   Collection Time: 09/12/19  6:26 PM   Specimen: Nasopharyngeal Swab  Result Value Ref Range Status   SARS Coronavirus 2 NEGATIVE NEGATIVE Final    Comment: (NOTE) SARS-CoV-2 target nucleic acids are NOT DETECTED. The SARS-CoV-2 RNA is generally detectable in upper and lower respiratory specimens during the acute phase of infection. Negative results do not preclude SARS-CoV-2 infection, do not rule out co-infections with other pathogens, and should not be used as the sole basis for treatment or other patient management decisions. Negative results must be  combined with clinical observations, patient history, and epidemiological information. The expected result is Negative. Fact Sheet for Patients: HairSlick.nohttps://www.fda.gov/media/138098/download Fact Sheet for Healthcare Providers: quierodirigir.comhttps://www.fda.gov/media/138095/download This test is not yet approved or cleared by the [place] FDA and  has been authorized for detection and/or diagnosis of SARS-CoV-2 by FDA under an Emergency Use Authorization (EUA). This EUA will remain  in effect (meaning this test can be used) for the duration of the COVID-19 declaration under Section 56 4(b)(1) of the Act, 21 U.S.C. section 360bbb-3(b)(1), unless the authorization is terminated or revoked sooner. Performed at [facility], [address] [place], [place] [address]      Labs: Basic Metabolic Panel: Recent Labs  Lab 09/12/19 1458 09/12/19 2231 09/13/19 0447  NA 138  --  139  K 3.6  --  4.4  CL 104  --  106  CO2 22  --  23  GLUCOSE 112*  --  112*  BUN 8  --  9  CREATININE 0.88  --  0.72  CALCIUM 9.2  --  8.5*  MG  --  2.3  --    Liver Function Tests: No results for input(s): AST, ALT, ALKPHOS, BILITOT, PROT, ALBUMIN in the last 168 hours. No results for input(s): LIPASE, AMYLASE in the last 168 hours. No results for input(s): AMMONIA in the last 168 hours. CBC: Recent Labs  Lab 09/12/19 1458 09/13/19 0447 09/14/19 0610  WBC 7.8 7.1 5.7  HGB 12.5 10.6* 10.4*  HCT 41.4 36.2 35.3*  MCV 64.3* 63.5* 63.9*  PLT 337 277 245   Cardiac Enzymes: No results for input(s): CKTOTAL, CKMB, CKMBINDEX, TROPONINI in the last 168 hours. BNP: BNP (last 3 results) No results for input(s): BNP in the last 8760 hours.  ProBNP (last 3 results) Recent Labs    11/19/18 1234  PROBNP 41.0    CBG: No results for input(s): GLUCAP in the last 168 hours.     SignedGwenyth Bender NP [facility] 09/14/2019, 2:48 PM

## 2019-09-13 NOTE — Progress Notes (Signed)
[facility] PROGRESS NOTE  Crystal Myers WCB:[id number] DOB: Apr 09, 1981 DOA: 09/12/2019 PCP: Ronney Lion, NP  Assessment/Plan:  Chest pain: Patient with typical anginal type symptoms with negative troponins x2. EKG shows sinus tachycardia with frequent PVCs. Recent echocardiogram was unremarkable. Prior stress test December 2019 was low risk. She does have reproducible musculoskeletal chest pain. Suspect also some component of stress/anxiety. Evaluated by cards who opine ACS and recommend stress test that revealed EF 48%. Recommend cardiac cath tomorrow -npo past midnight -continue heparin gtt -monitor   Frequent PVCs: Recent echo without significant abnormality. Has significant caffeine intake. Also likely some component of underlying sleep apnea. She denied alcohol use. tsh 2.9. -Continue metoprolol -monitor  Hypertension: fair control. Continue metoprolol.  Asthma: No active wheezing or respiratory distress. Continue albuterol as needed.    Code Status: full Family Communication: patient Disposition Plan: home when ready   Consultants:  Dr Algie Coffer cards  Procedures:  Stress test 09/13/19  Antibiotics:    HPI/Subjective: Awake alert. Reports pain free.   Objective: Vitals:   09/13/19 0925 09/13/19 1205  BP: 127/78 104/66  Pulse:  76  Resp:  16  Temp:  98.4 F (36.9 C)  SpO2:  93%    Intake/Output Summary (Last 24 hours) at 09/13/2019 1429 Last data filed at 09/13/2019 0500 Gross per 24 hour  Intake 104.06 ml  Output -  Net 104.06 ml   Filed Weights   09/12/19 1455 09/13/19 0203  Weight: 122 kg 122.9 kg    Exam:   General:  Obese lying in bed no acute distress  Cardiovascular: rrr no mgr no LE edema  Respiratory: normal effort no increased work of breathing  Abdomen: obese soft +BS no guarding or rebounding  Musculoskeletal: joints without swelling/erythema   Data Reviewed: Basic Metabolic Panel: Recent Labs  Lab  09/12/19 1458 09/12/19 2231 09/13/19 0447  NA 138  --  139  K 3.6  --  4.4  CL 104  --  106  CO2 22  --  23  GLUCOSE 112*  --  112*  BUN 8  --  9  CREATININE 0.88  --  0.72  CALCIUM 9.2  --  8.5*  MG  --  2.3  --    Liver Function Tests: No results for input(s): AST, ALT, ALKPHOS, BILITOT, PROT, ALBUMIN in the last 168 hours. No results for input(s): LIPASE, AMYLASE in the last 168 hours. No results for input(s): AMMONIA in the last 168 hours. CBC: Recent Labs  Lab 09/12/19 1458 09/13/19 0447  WBC 7.8 7.1  HGB 12.5 10.6*  HCT 41.4 36.2  MCV 64.3* 63.5*  PLT 337 277   Cardiac Enzymes: No results for input(s): CKTOTAL, CKMB, CKMBINDEX, TROPONINI in the last 168 hours. BNP (last 3 results) No results for input(s): BNP in the last 8760 hours.  ProBNP (last 3 results) Recent Labs    11/19/18 1234  PROBNP 41.0    CBG: No results for input(s): GLUCAP in the last 168 hours.  Recent Results (from the past 240 hour(s))  SARS CORONAVIRUS 2 (TAT 6-24 HRS) Nasopharyngeal Nasopharyngeal Swab     Status: None   Collection Time: 09/12/19  6:26 PM   Specimen: Nasopharyngeal Swab  Result Value Ref Range Status   SARS Coronavirus 2 NEGATIVE NEGATIVE Final    Comment: (NOTE) SARS-CoV-2 target nucleic acids are NOT DETECTED. The SARS-CoV-2 RNA is generally detectable in upper and lower respiratory specimens during the acute phase of infection. Negative results do not  preclude SARS-CoV-2 infection, do not rule out co-infections with other pathogens, and should not be used as the sole basis for treatment or other patient management decisions. Negative results must be combined with clinical observations, patient history, and epidemiological information. The expected result is Negative. Fact Sheet for Patients: SugarRoll.be Fact Sheet for Healthcare Providers: https://www.woods-mathews.com/ This test is not yet approved or cleared by the  [place] FDA and  has been authorized for detection and/or diagnosis of SARS-CoV-2 by FDA under an Emergency Use Authorization (EUA). This EUA will remain  in effect (meaning this test can be used) for the duration of the COVID-19 declaration under Section 56 4(b)(1) of the Act, 21 U.S.C. section 360bbb-3(b)(1), unless the authorization is terminated or revoked sooner. Performed at [facility], [place] [address], [place], [place] [address]      Studies: Dg Chest 2 View  Result Date: 09/12/2019 CLINICAL DATA:  Sharp chest pain.  Left arm numbness. EXAM: CHEST - 2 VIEW COMPARISON:  None. FINDINGS: The heart size and mediastinal contours are within normal limits. Both lungs are clear. The visualized skeletal structures are unremarkable. IMPRESSION: No active cardiopulmonary disease. Electronically Signed   By: Dorise Bullion III M.D   On: 09/12/2019 15:39    Scheduled Meds: . aspirin EC  81 mg Oral Daily  . atorvastatin  40 mg Oral q1800  . metoprolol succinate  25 mg Oral Daily  . sodium chloride flush  3 mL Intravenous Once   Continuous Infusions:  Principal Problem:   Chest pain Active Problems:   Acute coronary syndrome (HCC)   Essential hypertension   Moderate persistent asthma   GERD (gastroesophageal reflux disease)    Time spent: 69 minutes    [place] NP [facility]  If 7PM-7AM, please contact night-coverage at [email or web address], password [facility] 09/13/2019, 2:29 PM  LOS: 0 days

## 2019-09-13 NOTE — Progress Notes (Signed)
Crystal Myers for heparin Indication: chest pain/ACS  Allergies  Allergen Reactions  . Ferrous Gluconate Nausea Only  . Adhesive [Tape] Itching    EKG leads and clear, "plastic" tape = SEVERE ITCHING  . Penicillins Rash    Has patient had a PCN reaction causing immediate rash, facial/tongue/throat swelling, SOB or lightheadedness with hypotension: No Has patient had a PCN reaction causing severe rash involving mucus membranes or skin necrosis: No Has patient had a PCN reaction that required hospitalization No Has patient had a PCN reaction occurring within the last 10 years: No If all of the above answers are "NO", then may proceed with Cephalosporin use.   . Chantix [Varenicline] Other (See Comments)    Nightmares   . Dust Mite Mixed Allergen Ext [Mite (D. Farinae)] Itching  . Other Itching    GRASS and POLLEN   . Oxycodone Itching  . Amoxapine Itching  . Augmentin [Amoxicillin-Pot Clavulanate] Itching    Has patient had a PCN reaction causing immediate rash, facial/tongue/throat swelling, SOB or lightheadedness with hypotension: Yes Has patient had a PCN reaction causing severe rash involving mucus membranes or skin necrosis: Yes Has patient had a PCN reaction that required hospitalization No Has patient had a PCN reaction occurring within the last 10 years: No If all of the above answers are "NO", then may proceed with Cephalosporin use.     Patient Measurements: Height: 5' 8.5" (174 cm) Weight: 270 lb 14.4 oz (122.9 kg) IBW/kg (Calculated) : 65.05 Heparin Dosing Weight: 92kg  Vital Signs: Temp: 98.4 F (36.9 C) (10/19 1205) Temp Source: Oral (10/19 1205) BP: 104/66 (10/19 1205) Pulse Rate: 76 (10/19 1205)  Labs: Recent Labs    09/12/19 1458 09/12/19 1718 09/13/19 0447 09/13/19 1212  HGB 12.5  --  10.6*  --   HCT 41.4  --  36.2  --   PLT 337  --  277  --   HEPARINUNFRC  --   --  <0.10* 0.26*  CREATININE 0.88  --  0.72  --    TROPONINIHS <2 <2  --   --     Estimated Creatinine Clearance: 131.5 mL/min (by C-G formula based on SCr of 0.72 mg/dL).   Medical History: Past Medical History:  Diagnosis Date  . ADHD   . Anemia   . Anxiety   . Asthma   . Chest pain   . Chlamydia   . Depression   . Headache   . Hypertension      Assessment: [name] admitted with CP. Pharmacy to start IV heparin. Trops negative, CBC wnl, no AC PTA.  Heparin level subtherapeutic at 0.26 s/p rate increase, no infusion issues per RN  Goal of Therapy:  Heparin level 0.3-0.7 units/ml Monitor platelets by anticoagulation protocol: Yes   Plan:  Increase heparin gtt to 1500 units/hr F/u 6 hour heparin level F/u cath plans per cards  Bertis Ruddy, PharmD Clinical Pharmacist Please check AMION for all [place] numbers 09/13/2019 2:35 PM

## 2019-09-13 NOTE — Progress Notes (Signed)
Ref: Hazy, Marian, NP   Subjective:  Awake. Afebrile. EKG negative, chest pain positive nuclear stress test with 48 % EF and subtle anterior ischemia.  Objective:  Vital Signs in the last 24 hours: Temp:  [97.8 F (36.6 C)-99.4 F (37.4 C)] 98.4 F (36.9 C) (10/19 1205) Pulse Rate:  [41-124] 76 (10/19 1205) Cardiac Rhythm: Normal sinus rhythm (10/19 0700) Resp:  [13-27] 16 (10/19 1205) BP: (90-150)/(53-96) 104/66 (10/19 1205) SpO2:  [93 %-100 %] 93 % (10/19 1205) Weight:  [122 kg-122.9 kg] 122.9 kg (10/19 0203)  Physical Exam: BP Readings from Last 1 Encounters:  09/13/19 104/66     Wt Readings from Last 1 Encounters:  09/13/19 122.9 kg    Weight change:  Body mass index is 40.59 kg/m. HEENT: [place]/AT, Eyes-Blue, PERL, EOMI, Conjunctiva-Pink, Sclera-Non-icteric Neck: No JVD, No bruit, Trachea midline. Lungs:  Clear, Bilateral. Cardiac:  Regular rhythm, normal S1 and S2, no S3. II/VI systolic murmur. Abdomen:  Soft, non-tender. BS present. Extremities:  No edema present. No cyanosis. No clubbing. CNS: AxOx3, Cranial nerves grossly intact, moves all 4 extremities.  Skin: Warm and dry.   Intake/Output from previous day: 10/18 0701 - 10/19 0700 In: 104.1 [I.V.:104.1] Out: -     Lab Results: BMET    Component Value Date/Time   NA 139 09/13/2019 0447   NA 138 09/12/2019 1458   NA 139 05/22/2019 1205   NA 140 04/27/2018 1117   K 4.4 09/13/2019 0447   K 3.6 09/12/2019 1458   K 4.0 05/22/2019 1205   CL 106 09/13/2019 0447   CL 104 09/12/2019 1458   CL 106 05/22/2019 1205   CO2 23 09/13/2019 0447   CO2 22 09/12/2019 1458   CO2 20 (L) 05/22/2019 1205   GLUCOSE 112 (H) 09/13/2019 0447   GLUCOSE 112 (H) 09/12/2019 1458   GLUCOSE 106 (H) 05/22/2019 1205   BUN 9 09/13/2019 0447   BUN 8 09/12/2019 1458   BUN 10 05/22/2019 1205   BUN 8 04/27/2018 1117   CREATININE 0.72 09/13/2019 0447   CREATININE 0.88 09/12/2019 1458   CREATININE 0.77 05/22/2019 1205   CALCIUM 8.5  (L) 09/13/2019 0447   CALCIUM 9.2 09/12/2019 1458   CALCIUM 9.5 05/22/2019 1205   GFRNONAA >60 09/13/2019 0447   GFRNONAA >60 09/12/2019 1458   GFRNONAA >60 05/22/2019 1205   GFRAA >60 09/13/2019 0447   GFRAA >60 09/12/2019 1458   GFRAA >60 05/22/2019 1205   CBC    Component Value Date/Time   WBC 7.1 09/13/2019 0447   RBC 5.70 (H) 09/13/2019 0447   HGB 10.6 (L) 09/13/2019 0447   HGB 11.1 04/27/2018 1117   HCT 36.2 09/13/2019 0447   HCT 36.2 04/27/2018 1117   PLT 277 09/13/2019 0447   PLT 301 04/27/2018 1117   MCV 63.5 (L) 09/13/2019 0447   MCV 63 (L) 04/27/2018 1117   MCH 18.6 (L) 09/13/2019 0447   MCHC 29.3 (L) 09/13/2019 0447   RDW 15.9 (H) 09/13/2019 0447   RDW 16.8 (H) 04/27/2018 1117   LYMPHSABS 1.6 05/22/2019 1205   MONOABS 0.4 05/22/2019 1205   EOSABS 0.2 05/22/2019 1205   BASOSABS 0.1 05/22/2019 1205   HEPATIC Function Panel Recent Labs    05/22/19 1205  PROT 7.4   HEMOGLOBIN A1C No components found for: HGA1C,  MPG CARDIAC ENZYMES Lab Results  Component Value Date   TROPONINI <0.03 11/21/2018   BNP Recent Labs    11/19/18 1234  PROBNP 41.0   TSH   Recent Labs    11/21/18 0026 09/12/19 2231  TSH 4.639* 2.952   CHOLESTEROL Recent Labs    11/21/18 0456 09/13/19 0447  CHOL 204* 237*    Scheduled Meds: . aspirin EC  81 mg Oral Daily  . atorvastatin  40 mg Oral q1800  . metoprolol succinate  25 mg Oral Daily  . sodium chloride flush  3 mL Intravenous Once   Continuous Infusions: . heparin     PRN Meds:.acetaminophen, albuterol, baclofen, nitroGLYCERIN, ondansetron (ZOFRAN) IV  Assessment/Plan: Acute coronary syndrome Exertional dyspnea Asthma ADHD Morbid obesity Hyperlipidemia Thalassemia minor with microcytic anemia.  Cardiac cath tomorrow.   LOS: 0 days   Time spent including chart review, lab review, examination, discussion with patient, nurse and referring docotor : 30 min   Dixie Dials  MD  09/13/2019, 2:45  PM

## 2019-09-13 NOTE — Progress Notes (Signed)
Loop for heparin Indication: chest pain/ACS  Allergies  Allergen Reactions  . Ferrous Gluconate Nausea Only  . Adhesive [Tape] Itching    EKG leads and clear, "plastic" tape = SEVERE ITCHING  . Penicillins Rash    Has patient had a PCN reaction causing immediate rash, facial/tongue/throat swelling, SOB or lightheadedness with hypotension: No Has patient had a PCN reaction causing severe rash involving mucus membranes or skin necrosis: No Has patient had a PCN reaction that required hospitalization No Has patient had a PCN reaction occurring within the last 10 years: No If all of the above answers are "NO", then may proceed with Cephalosporin use.   . Chantix [Varenicline] Other (See Comments)    Nightmares   . Dust Mite Mixed Allergen Ext [Mite (D. Farinae)] Itching  . Other Itching    GRASS and POLLEN   . Oxycodone Itching  . Amoxapine Itching  . Augmentin [Amoxicillin-Pot Clavulanate] Itching    Has patient had a PCN reaction causing immediate rash, facial/tongue/throat swelling, SOB or lightheadedness with hypotension: Yes Has patient had a PCN reaction causing severe rash involving mucus membranes or skin necrosis: Yes Has patient had a PCN reaction that required hospitalization No Has patient had a PCN reaction occurring within the last 10 years: No If all of the above answers are "NO", then may proceed with Cephalosporin use.     Patient Measurements: Height: 5' 8.5" (174 cm) Weight: 270 lb 14.4 oz (122.9 kg) IBW/kg (Calculated) : 65.05 Heparin Dosing Weight: 92kg  Vital Signs: Temp: 98.4 F (36.9 C) (10/19 1830) Temp Source: Oral (10/19 1830) BP: 96/54 (10/19 1830) Pulse Rate: 75 (10/19 1830)  Labs: Recent Labs    09/12/19 1458 09/12/19 1718 09/13/19 0447 09/13/19 1212 09/13/19 2020  HGB 12.5  --  10.6*  --   --   HCT 41.4  --  36.2  --   --   PLT 337  --  277  --   --   HEPARINUNFRC  --   --  <0.10* 0.26* 0.16*   CREATININE 0.88  --  0.72  --   --   TROPONINIHS <2 <2  --   --   --     Estimated Creatinine Clearance: 131.5 mL/min (by C-G formula based on SCr of 0.72 mg/dL).   Medical History: Past Medical History:  Diagnosis Date  . ADHD   . Anemia   . Anxiety   . Asthma   . Chest pain   . Chlamydia   . Depression   . Headache   . Hypertension      Assessment: 38 yo F admitted with CP. Pharmacy to start IV heparin. Trops negative, CBC wnl, no AC PTA.  Positive stress test with plans for cardiac cath 10/20.  Heparin level subtherapeutic at 0.16 despite rate increase, no infusion issues per RN.  Goal of Therapy:  Heparin level 0.3-0.7 units/ml Monitor platelets by anticoagulation protocol: Yes   Plan:  Increase heparin gtt to 1700 units/hr F/u 6 hour heparin level F/u cath plans per cards  [facility], Pharm.D., BCPS Clinical Pharmacist  **Pharmacist phone directory can now be found on [email or web address] ([email or web address]).  Listed under [place].  09/13/2019 9:14 PM

## 2019-09-13 NOTE — H&P (View-Only) (Signed)
Ref: Ronney Lion, NP   Subjective:  Awake. Afebrile. EKG negative, chest pain positive nuclear stress test with 48 % EF and subtle anterior ischemia.  Objective:  Vital Signs in the last 24 hours: Temp:  [97.8 F (36.6 C)-99.4 F (37.4 C)] 98.4 F (36.9 C) (10/19 1205) Pulse Rate:  [41-124] 76 (10/19 1205) Cardiac Rhythm: Normal sinus rhythm (10/19 0700) Resp:  [13-27] 16 (10/19 1205) BP: (90-150)/(53-96) 104/66 (10/19 1205) SpO2:  [93 %-100 %] 93 % (10/19 1205) Weight:  [122 kg-122.9 kg] 122.9 kg (10/19 0203)  Physical Exam: BP Readings from Last 1 Encounters:  09/13/19 104/66     Wt Readings from Last 1 Encounters:  09/13/19 122.9 kg    Weight change:  Body mass index is 40.59 kg/m. HEENT: [place]/AT, Eyes-Blue, PERL, EOMI, Conjunctiva-Pink, Sclera-Non-icteric Neck: No JVD, No bruit, Trachea midline. Lungs:  Clear, Bilateral. Cardiac:  Regular rhythm, normal S1 and S2, no S3. II/VI systolic murmur. Abdomen:  Soft, non-tender. BS present. Extremities:  No edema present. No cyanosis. No clubbing. CNS: AxOx3, Cranial nerves grossly intact, moves all 4 extremities.  Skin: Warm and dry.   Intake/Output from previous day: 10/18 0701 - 10/19 0700 In: 104.1 [I.V.:104.1] Out: -     Lab Results: BMET    Component Value Date/Time   NA 139 09/13/2019 0447   NA 138 09/12/2019 1458   NA 139 05/22/2019 1205   NA 140 04/27/2018 1117   K 4.4 09/13/2019 0447   K 3.6 09/12/2019 1458   K 4.0 05/22/2019 1205   CL 106 09/13/2019 0447   CL 104 09/12/2019 1458   CL 106 05/22/2019 1205   CO2 23 09/13/2019 0447   CO2 22 09/12/2019 1458   CO2 20 (L) 05/22/2019 1205   GLUCOSE 112 (H) 09/13/2019 0447   GLUCOSE 112 (H) 09/12/2019 1458   GLUCOSE 106 (H) 05/22/2019 1205   BUN 9 09/13/2019 0447   BUN 8 09/12/2019 1458   BUN 10 05/22/2019 1205   BUN 8 04/27/2018 1117   CREATININE 0.72 09/13/2019 0447   CREATININE 0.88 09/12/2019 1458   CREATININE 0.77 05/22/2019 1205   CALCIUM 8.5  (L) 09/13/2019 0447   CALCIUM 9.2 09/12/2019 1458   CALCIUM 9.5 05/22/2019 1205   GFRNONAA >60 09/13/2019 0447   GFRNONAA >60 09/12/2019 1458   GFRNONAA >60 05/22/2019 1205   GFRAA >60 09/13/2019 0447   GFRAA >60 09/12/2019 1458   GFRAA >60 05/22/2019 1205   CBC    Component Value Date/Time   WBC 7.1 09/13/2019 0447   RBC 5.70 (H) 09/13/2019 0447   HGB 10.6 (L) 09/13/2019 0447   HGB 11.1 04/27/2018 1117   HCT 36.2 09/13/2019 0447   HCT 36.2 04/27/2018 1117   PLT 277 09/13/2019 0447   PLT 301 04/27/2018 1117   MCV 63.5 (L) 09/13/2019 0447   MCV 63 (L) 04/27/2018 1117   MCH 18.6 (L) 09/13/2019 0447   MCHC 29.3 (L) 09/13/2019 0447   RDW 15.9 (H) 09/13/2019 0447   RDW 16.8 (H) 04/27/2018 1117   LYMPHSABS 1.6 05/22/2019 1205   MONOABS 0.4 05/22/2019 1205   EOSABS 0.2 05/22/2019 1205   BASOSABS 0.1 05/22/2019 1205   HEPATIC Function Panel Recent Labs    05/22/19 1205  PROT 7.4   HEMOGLOBIN A1C No components found for: HGA1C,  MPG CARDIAC ENZYMES Lab Results  Component Value Date   TROPONINI <0.03 11/21/2018   BNP Recent Labs    11/19/18 1234  PROBNP 41.0   TSH  Recent Labs    11/21/18 0026 09/12/19 2231  TSH 4.639* 2.952   CHOLESTEROL Recent Labs    11/21/18 0456 09/13/19 0447  CHOL 204* 237*    Scheduled Meds: . aspirin EC  81 mg Oral Daily  . atorvastatin  40 mg Oral q1800  . metoprolol succinate  25 mg Oral Daily  . sodium chloride flush  3 mL Intravenous Once   Continuous Infusions: . heparin     PRN Meds:.acetaminophen, albuterol, baclofen, nitroGLYCERIN, ondansetron (ZOFRAN) IV  Assessment/Plan: Acute coronary syndrome Exertional dyspnea Asthma ADHD Morbid obesity Hyperlipidemia Thalassemia minor with microcytic anemia.  Cardiac cath tomorrow.   LOS: 0 days   Time spent including chart review, lab review, examination, discussion with patient, nurse and referring docotor : 30 min   Dixie Dials  MD  09/13/2019, 2:45  PM

## 2019-09-13 NOTE — Progress Notes (Signed)
ANTICOAGULATION CONSULT NOTE - Follow Up Consult  Pharmacy Consult for heparin Indication: chest pain/ACS  Labs: Recent Labs    09/12/19 1458 09/12/19 1718 09/13/19 0447  HGB 12.5  --   --   HCT 41.4  --   --   PLT 337  --   --   HEPARINUNFRC  --   --  <0.10*  CREATININE 0.88  --   --   TROPONINIHS <2 <2  --     Assessment: 38yo female subtherapeutic on heparin with initial dosing for CP; no gtt issues or signs of bleeding per RN.  Goal of Therapy:  Heparin level 0.3-0.7 units/ml   Plan:  Will rebolus with heparin 3000 units and increase heparin gtt by 4 units/kgABW/hr to 1400 units/hr and check level in 6 hours.    Wynona Neat, PharmD, BCPS  09/13/2019,5:47 AM

## 2019-09-14 ENCOUNTER — Encounter (HOSPITAL_COMMUNITY): Payer: Self-pay | Admitting: Cardiovascular Disease

## 2019-09-14 ENCOUNTER — Encounter (HOSPITAL_COMMUNITY)
Admission: EM | Disposition: A | Discharge status: Home / Self Care | Payer: Self-pay | Source: Home / Self Care | Attending: Emergency Medicine

## 2019-09-14 DIAGNOSIS — R079 Chest pain, unspecified: Secondary | ICD-10-CM | POA: Diagnosis not present

## 2019-09-14 DIAGNOSIS — I249 Acute ischemic heart disease, unspecified: Secondary | ICD-10-CM | POA: Diagnosis not present

## 2019-09-14 DIAGNOSIS — K219 Gastro-esophageal reflux disease without esophagitis: Secondary | ICD-10-CM | POA: Diagnosis not present

## 2019-09-14 HISTORY — PX: LEFT HEART CATH AND CORONARY ANGIOGRAPHY: CATH118249

## 2019-09-14 LAB — CBC
HCT: 35.3 % — ABNORMAL LOW (ref 36.0–46.0)
Hemoglobin: 10.4 g/dL — ABNORMAL LOW (ref 12.0–15.0)
MCH: 18.8 pg — ABNORMAL LOW (ref 26.0–34.0)
MCHC: 29.5 g/dL — ABNORMAL LOW (ref 30.0–36.0)
MCV: 63.9 fL — ABNORMAL LOW (ref 80.0–100.0)
Platelets: 245 10*3/uL (ref 150–400)
RBC: 5.52 MIL/uL — ABNORMAL HIGH (ref 3.87–5.11)
RDW: 15.9 % — ABNORMAL HIGH (ref 11.5–15.5)
WBC: 5.7 10*3/uL (ref 4.0–10.5)
nRBC: 0 % (ref 0.0–0.2)

## 2019-09-14 LAB — HEPARIN LEVEL (UNFRACTIONATED): Heparin Unfractionated: >2.2 IU/mL — ABNORMAL HIGH (ref 0.30–0.70)

## 2019-09-14 SURGERY — LEFT HEART CATH AND CORONARY ANGIOGRAPHY
Anesthesia: LOCAL

## 2019-09-14 MED ORDER — HEPARIN SODIUM (PORCINE) 1000 UNIT/ML IJ SOLN
INTRAMUSCULAR | Status: DC | PRN
  Administered 2019-09-14: 6000 [IU] via INTRAVENOUS

## 2019-09-14 MED ORDER — MIDAZOLAM HCL 2 MG/2ML IJ SOLN
INTRAMUSCULAR | Status: AC
  Filled 2019-09-14: qty 2

## 2019-09-14 MED ORDER — SODIUM CHLORIDE 0.9% FLUSH: 3.0000 mL | Freq: Two times a day (BID) | INTRAVENOUS | Status: DC

## 2019-09-14 MED ORDER — FENTANYL CITRATE (PF) 100 MCG/2ML IJ SOLN
INTRAMUSCULAR | Status: AC
  Filled 2019-09-14: qty 2

## 2019-09-14 MED ORDER — SODIUM CHLORIDE 0.9 % IV SOLN: INTRAVENOUS | Status: AC

## 2019-09-14 MED ORDER — HYDRALAZINE HCL 20 MG/ML IJ SOLN: 10.0000 mg | INTRAMUSCULAR | Status: DC | PRN

## 2019-09-14 MED ORDER — IOHEXOL 350 MG/ML SOLN
INTRAVENOUS | Status: DC | PRN
  Administered 2019-09-14: 11:00:00 60 mL

## 2019-09-14 MED ORDER — LIDOCAINE HCL (PF) 1 % IJ SOLN
INTRAMUSCULAR | Status: AC
  Filled 2019-09-14: qty 30

## 2019-09-14 MED ORDER — HEPARIN (PORCINE) IN NACL 1000-0.9 UT/500ML-% IV SOLN
INTRAVENOUS | Status: AC
  Filled 2019-09-14: qty 1000

## 2019-09-14 MED ORDER — VERAPAMIL HCL 2.5 MG/ML IV SOLN
INTRAVENOUS | Status: DC | PRN
  Administered 2019-09-14: 10:00:00 10 mL via INTRA_ARTERIAL

## 2019-09-14 MED ORDER — LABETALOL HCL 5 MG/ML IV SOLN: 10.0000 mg | INTRAVENOUS | Status: DC | PRN

## 2019-09-14 MED ORDER — MIDAZOLAM HCL 2 MG/2ML IJ SOLN
INTRAMUSCULAR | Status: DC | PRN
  Administered 2019-09-14 (×2): 1 mg via INTRAVENOUS

## 2019-09-14 MED ORDER — HEPARIN (PORCINE) IN NACL 1000-0.9 UT/500ML-% IV SOLN
INTRAVENOUS | Status: DC | PRN
  Administered 2019-09-14 (×2): 500 mL

## 2019-09-14 MED ORDER — LIDOCAINE HCL (PF) 1 % IJ SOLN
INTRAMUSCULAR | Status: DC | PRN
  Administered 2019-09-14 (×2): 2 mL

## 2019-09-14 MED ORDER — HEPARIN (PORCINE) 25000 UT/250ML-% IV SOLN
1500.0000 [IU]/h | INTRAVENOUS | Status: DC
  Administered 2019-09-14: 1500 [IU]/h via INTRAVENOUS
  Filled 2019-09-14: qty 250

## 2019-09-14 MED ORDER — HEPARIN SODIUM (PORCINE) 1000 UNIT/ML IJ SOLN
INTRAMUSCULAR | Status: AC
  Filled 2019-09-14: qty 1

## 2019-09-14 MED ORDER — SODIUM CHLORIDE 0.9% FLUSH: 3.0000 mL | INTRAVENOUS | Status: DC | PRN

## 2019-09-14 MED ORDER — FENTANYL CITRATE (PF) 100 MCG/2ML IJ SOLN
INTRAMUSCULAR | Status: DC | PRN
  Administered 2019-09-14 (×2): 25 ug via INTRAVENOUS

## 2019-09-14 MED ORDER — SODIUM CHLORIDE 0.9 % IV SOLN: 250.0000 mL | INTRAVENOUS | Status: DC | PRN

## 2019-09-14 SURGICAL SUPPLY — 11 items
CATH 5FR JL3.5 JR4 ANG PIG MP (CATHETERS) ×1 IMPLANT
DEVICE RAD COMP TR BAND LRG (VASCULAR PRODUCTS) ×1 IMPLANT
GLIDESHEATH SLEND A-KIT 6F 22G (SHEATH) ×1 IMPLANT
GLIDESHEATH SLEND SS 6F .021 (SHEATH) ×1 IMPLANT
GUIDEWIRE INQWIRE 1.5J.035X260 (WIRE) IMPLANT
INQWIRE 1.5J .035X260CM (WIRE) ×4
KIT HEART LEFT (KITS) ×2 IMPLANT
PACK CARDIAC CATHETERIZATION (CUSTOM PROCEDURE TRAY) ×2 IMPLANT
SHEATH PROBE COVER 6X72 (BAG) ×1 IMPLANT
SYR MEDRAD MARK 7 150ML (SYRINGE) ×2 IMPLANT
TRANSDUCER W/STOPCOCK (MISCELLANEOUS) ×2 IMPLANT

## 2019-09-14 NOTE — Progress Notes (Signed)
TR Band Placement- right  Amount of air in band- 0   Time air was removed from band- 15  Dressing placed- Gauze and Tegaderm  Level prior to band removal- 0  Level after removal-  0  Post Education- Yes   Comments-

## 2019-09-14 NOTE — Progress Notes (Signed)
Received pt from Cathlab. R radial level 0. Per  report pt will be discharge to home today. Awaiting for primary MD to d/c pt.

## 2019-09-14 NOTE — Discharge Summary (Signed)
Physician Discharge Summary  Crystal Myers ZOX:[id number] DOB: December 06, 1980 DOA: 09/12/2019  PCP: Ronney LionHazy, Marian, NP  Admit date: 09/12/2019 Discharge date: 09/14/2019  Time spent: 35 minutes  Recommendations for Outpatient Follow-up:  PCP in 1 week, continue weight loss, follow-up with dietitian  Discharge Diagnoses:  Principal Problem:   Chest pain Morbid obesity   Moderate persistent asthma   GERD (gastroesophageal reflux disease)   Acute coronary syndrome [facility](HCC)   Essential hypertension   Discharge Condition: Stable  Diet recommendation: Heart healthy  Filed Weights   09/12/19 1455 09/13/19 0203  Weight: 122 kg 122.9 kg    History of present illness:  8/F with history of asthma, morbid obesity, intermittent chest pains since December 2019 presented to the emergency room yesterday with substernal chest pressure radiating down her left arm with some left arm tingling and numbness, she reports having intermittent left arm numbness for over a week  Hospital Course:   Chest pain -EKG was unremarkable, high-sensitivity troponin was negative x3, seen by cardiology Dr. Algie CofferKadakia in consultation underwent a Myoview today which showed mild reversibility in the anterior wall, slightly low EF of 48%, left heart catheterization was recommended, this was completed today and showed normal coronaries, discharged home in a stable condition subsequently  Morbid obesity -Educated regarding diet, lifestyle modification, she is under the guidance of a nutritionist and working with her PCP on this, also has considered weight loss surgery in the past  Asthma -Stable, continue Dulera and as needed albuterol  Mild hyperglycemia - hemoglobin A1c is 5.1   Discharge Exam: Vitals:   09/14/19 1145 09/14/19 1406  BP: 133/87 120/76  Pulse: 81 77  Resp: (!) 22 20  Temp:  98.8 F (37.1 C)  SpO2: 97% 100%    General: Awake oriented x3 Cardiovascular: S1 S2, regular rate  rhythm Respiratory: Clear  Discharge Instructions   Discharge Instructions    Call MD for:  difficulty breathing, headache or visual disturbances   Complete by: As directed    Call MD for:  persistant dizziness or light-headedness   Complete by: As directed    Call MD for:  persistant nausea and vomiting   Complete by: As directed    Diet - low sodium heart healthy   Complete by: As directed    Discharge instructions   Complete by: As directed    Take medications as prescribed Follow up with PCP 1-2 weeks for evaluation of symptoms   Increase activity slowly   Complete by: As directed      Allergies as of 09/14/2019      Reactions   Ferrous Gluconate Nausea Only   Adhesive [tape] Itching   EKG leads and clear, "plastic" tape = SEVERE ITCHING   Penicillins Rash   Has patient had a PCN reaction causing immediate rash, facial/tongue/throat swelling, SOB or lightheadedness with hypotension: No Has patient had a PCN reaction causing severe rash involving mucus membranes or skin necrosis: No Has patient had a PCN reaction that required hospitalization No Has patient had a PCN reaction occurring within the last 10 years: No If all of the above answers are "NO", then may proceed with Cephalosporin use.   Chantix [varenicline] Other (See Comments)   Nightmares   Dust Mite Mixed Allergen Ext [mite (d. Farinae)] Itching   Other Itching   GRASS and POLLEN    Oxycodone Itching   Amoxapine Itching   Augmentin [amoxicillin-pot Clavulanate] Itching   Has patient had a PCN reaction causing immediate rash, facial/tongue/throat  swelling, SOB or lightheadedness with hypotension: Yes Has patient had a PCN reaction causing severe rash involving mucus membranes or skin necrosis: Yes Has patient had a PCN reaction that required hospitalization No Has patient had a PCN reaction occurring within the last 10 years: No If all of the above answers are "NO", then may proceed with Cephalosporin use.       Medication List    STOP taking these medications   ciprofloxacin 500 MG tablet Commonly known as: CIPRO   HYDROcodone-acetaminophen 5-325 MG tablet Commonly known as: NORCO/VICODIN   ibuprofen 200 MG tablet Commonly known as: ADVIL   ondansetron 4 MG disintegrating tablet Commonly known as: Zofran ODT     TAKE these medications   albuterol 108 (90 Base) MCG/ACT inhaler Commonly known as: ProAir HFA Inhale 2 puffs into the lungs every 4 (four) hours as needed for wheezing or shortness of breath.   atorvastatin 40 MG tablet Commonly known as: LIPITOR Take 1 tablet (40 mg total) by mouth daily at 6 PM.   citalopram 20 MG tablet Commonly known as: CELEXA Take 20 mg by mouth daily.   EPINEPHrine 0.15 MG/0.3ML injection Commonly known as: EPIPEN JR INJECT 0.3 MLS (0.15 MG TOTAL) INTO THE MUSCLE AS NEEDED FOR ANAPHYLAXIS. What changed: Another medication with the same name was changed. Make sure you understand how and when to take each.   EPINEPHrine 0.3 mg/0.3 mL Soaj injection Commonly known as: EPI-PEN INJECT 0.3 MG INTO THE MUSCLE AS NEEDED FOR ANAPHYLAXIS What changed: See the new instructions.   guanFACINE 1 MG Tb24 ER tablet Commonly known as: INTUNIV Take 1 mg by mouth daily.   ipratropium-albuterol 0.5-2.5 (3) MG/3ML Soln Commonly known as: DUONEB Take 3 mLs by nebulization every 2 (two) hours as needed.   Levonorgestrel-Ethinyl Estradiol 0.15-0.03 &0.01 MG tablet Commonly known as: Camrese Take 1 tablet by mouth daily.   levothyroxine 25 MCG tablet Commonly known as: SYNTHROID Take 25 mcg by mouth daily.   metoprolol succinate 25 MG 24 hr tablet Commonly known as: TOPROL-XL Take 1 tablet (25 mg total) by mouth daily.   mometasone-formoterol 200-5 MCG/ACT Aero Commonly known as: Dulera Inhale 2 puffs into the lungs 2 (two) times daily.   nitroGLYCERIN 0.4 MG SL tablet Commonly known as: NITROSTAT Place 0.4 mg under the tongue every 5 (five)  minutes as needed for chest pain.   pantoprazole 20 MG tablet Commonly known as: PROTONIX Take 20 mg by mouth daily.   traZODone 50 MG tablet Commonly known as: DESYREL Take 50 mg by mouth at bedtime as needed for sleep.   Vitamin D (Ergocalciferol) 1.25 MG (50000 UT) Caps capsule Commonly known as: DRISDOL Take 50,000 Units by mouth every Saturday.   Vyvanse 40 MG capsule Generic drug: lisdexamfetamine Take 40 mg by mouth daily.      Allergies  Allergen Reactions  . Ferrous Gluconate Nausea Only  . Adhesive [Tape] Itching    EKG leads and clear, "plastic" tape = SEVERE ITCHING  . Penicillins Rash    Has patient had a PCN reaction causing immediate rash, facial/tongue/throat swelling, SOB or lightheadedness with hypotension: No Has patient had a PCN reaction causing severe rash involving mucus membranes or skin necrosis: No Has patient had a PCN reaction that required hospitalization No Has patient had a PCN reaction occurring within the last 10 years: No If all of the above answers are "NO", then may proceed with Cephalosporin use.   . Chantix [Varenicline] Other (See Comments)  Nightmares   . Dust Mite Mixed Allergen Ext [Mite (D. Farinae)] Itching  . Other Itching    GRASS and POLLEN   . Oxycodone Itching  . Amoxapine Itching  . Augmentin [Amoxicillin-Pot Clavulanate] Itching    Has patient had a PCN reaction causing immediate rash, facial/tongue/throat swelling, SOB or lightheadedness with hypotension: Yes Has patient had a PCN reaction causing severe rash involving mucus membranes or skin necrosis: Yes Has patient had a PCN reaction that required hospitalization No Has patient had a PCN reaction occurring within the last 10 years: No If all of the above answers are "NO", then may proceed with Cephalosporin use.    Follow-up Information    Orpah Cobb, MD. Go on 09/22/2019.   Specialty: Cardiology Why: :00 Noon Contact information: [address] [place] [address] [phone]        [facility]. Go on 09/23/2019.   Why: :50am Contact information: [address] [place] [address] [phone]           The results of significant diagnostics from this hospitalization (including imaging, microbiology, ancillary and laboratory) are listed below for reference.    Significant Diagnostic Studies: Dg Chest 2 View  Result Date: 09/12/2019 CLINICAL DATA:  Sharp chest pain.  Left arm numbness. EXAM: CHEST - 2 VIEW COMPARISON:  None. FINDINGS: The heart size and mediastinal contours are within normal limits. Both lungs are clear. The visualized skeletal structures are unremarkable. IMPRESSION: No active cardiopulmonary disease. Electronically Signed   By: Gerome Sam III M.D   On: 09/12/2019 15:39   Nm Myocar Multi W/spect Izetta Dakin Motion / Ef  Result Date: 09/13/2019 CLINICAL DATA:  Possible acute coronary syndrome. Negative troponins. EXAM: MYOCARDIAL IMAGING WITH SPECT (REST AND PHARMACOLOGIC-STRESS) GATED LEFT VENTRICULAR WALL MOTION STUDY LEFT VENTRICULAR EJECTION FRACTION TECHNIQUE: Standard myocardial SPECT imaging was performed after resting intravenous injection of 10 mCi Tc-54m Myoview. Subsequently, intravenous infusion of Lexiscan was performed under the supervision of the Cardiology staff. At peak effect of the drug, 30 mCi Tc-101m Myoview was injected intravenously and standard myocardial SPECT imaging was performed. Quantitative gated imaging was also performed to evaluate left ventricular wall motion, and estimate left ventricular ejection fraction. COMPARISON:  11/21/2018 FINDINGS: Perfusion: There is mild decreased radiotracer uptake along the anterior wall on the stress images is identified compared with the rest images. Findings are equivocal for myocardial reversibility. No fixed defects identified to suggest infarct. Wall Motion: Septal hypokinesis is  identified, new from previous exam. Left Ventricular Ejection Fraction: 48 %.  Previously 54% End diastolic volume 133 ml End systolic volume 69 ml IMPRESSION: 1. Mild reversibility involving the anterior wall on the stress images, equivocal for myocardial ischemia. 2. Marked left ventricular dilatation and septal hypokinesis. 3. Left ventricular ejection fraction 48% 4. Non invasive risk stratification*: High *2012 Appropriate Use Criteria for Coronary Revascularization Focused Update: J Am Coll Cardiol. 2012;59(9):857-881. http://content.dementiazones.com.aspx?articleid=1201161 Electronically Signed   By: Signa Kell M.D.   On: 09/13/2019 14:28    Microbiology: Recent Results (from the past 240 hour(s))  SARS CORONAVIRUS 2 (TAT 6-24 HRS) Nasopharyngeal Nasopharyngeal Swab     Status: None   Collection Time: 09/12/19  6:26 PM   Specimen: Nasopharyngeal Swab  Result Value Ref Range Status   SARS Coronavirus 2 NEGATIVE NEGATIVE Final    Comment: (NOTE) SARS-CoV-2 target nucleic acids are NOT DETECTED. The SARS-CoV-2 RNA is generally detectable in upper and lower respiratory specimens during the acute  phase of infection. Negative results do not preclude SARS-CoV-2 infection, do not rule out co-infections with other pathogens, and should not be used as the sole basis for treatment or other patient management decisions. Negative results must be combined with clinical observations, patient history, and epidemiological information. The expected result is Negative. Fact Sheet for Patients: HairSlick.no Fact Sheet for Healthcare Providers: quierodirigir.com This test is not yet approved or cleared by the [place] FDA and  has been authorized for detection and/or diagnosis of SARS-CoV-2 by FDA under an Emergency Use Authorization (EUA). This EUA will remain  in effect (meaning this test can be used) for the duration of the COVID-19  declaration under Section 56 4(b)(1) of the Act, 21 U.S.C. section 360bbb-3(b)(1), unless the authorization is terminated or revoked sooner. Performed at [facility], [address] [place], [place] [address]      Labs: Basic Metabolic Panel: Recent Labs  Lab 09/12/19 1458 09/12/19 2231 09/13/19 0447  NA 138  --  139  K 3.6  --  4.4  CL 104  --  106  CO2 22  --  23  GLUCOSE 112*  --  112*  BUN 8  --  9  CREATININE 0.88  --  0.72  CALCIUM 9.2  --  8.5*  MG  --  2.3  --    Liver Function Tests: No results for input(s): AST, ALT, ALKPHOS, BILITOT, PROT, ALBUMIN in the last 168 hours. No results for input(s): LIPASE, AMYLASE in the last 168 hours. No results for input(s): AMMONIA in the last 168 hours. CBC: Recent Labs  Lab 09/12/19 1458 09/13/19 0447 09/14/19 0610  WBC 7.8 7.1 5.7  HGB 12.5 10.6* 10.4*  HCT 41.4 36.2 35.3*  MCV 64.3* 63.5* 63.9*  PLT 337 277 245   Cardiac Enzymes: No results for input(s): CKTOTAL, CKMB, CKMBINDEX, TROPONINI in the last 168 hours. BNP: BNP (last 3 results) No results for input(s): BNP in the last 8760 hours.  ProBNP (last 3 results) Recent Labs    11/19/18 1234  PROBNP 41.0    CBG: No results for input(s): GLUCAP in the last 168 hours.     Signed:  Zannie Cove MD.  [facility] 09/14/2019, 3:36 PM

## 2019-09-14 NOTE — Interval H&P Note (Signed)
History and Physical Interval Note:  09/14/2019 9:41 AM  Crystal Myers  has presented today for surgery, with the diagnosis of chest pain.  The various methods of treatment have been discussed with the patient and family. After consideration of risks, benefits and other options for treatment, the patient has consented to  Procedure(s): LEFT HEART CATH AND CORONARY ANGIOGRAPHY (N/A) as a surgical intervention.  The patient's history has been reviewed, patient examined, no change in status, stable for surgery.  I have reviewed the patient's chart and labs.  Questions were answered to the patient's satisfaction.     Birdie Riddle

## 2019-09-14 NOTE — Progress Notes (Signed)
ANTICOAGULATION CONSULT NOTE Pharmacy Consult for heparin Indication: chest pain/ACS   Patient Measurements: Height: 5' 8.5" (174 cm) Weight: 270 lb 14.4 oz (122.9 kg) IBW/kg (Calculated) : 65.05 Heparin Dosing Weight: 92kg  Vital Signs: Temp: 97.8 F (36.6 C) (10/20 0500) Temp Source: Oral (10/20 0500) BP: 104/69 (10/20 0500) Pulse Rate: 76 (10/20 0500)  Labs: Recent Labs    09/12/19 1458 09/12/19 1718  09/13/19 0447 09/13/19 1212 09/13/19 2020 09/14/19 0610  HGB 12.5  --   --  10.6*  --   --  10.4*  HCT 41.4  --   --  36.2  --   --  35.3*  PLT 337  --   --  277  --   --  245  HEPARINUNFRC  --   --    < > <0.10* 0.26* 0.16* >2.20*  CREATININE 0.88  --   --  0.72  --   --   --   TROPONINIHS <2 <2  --   --   --   --   --    < > = values in this interval not displayed.    Estimated Creatinine Clearance: 131.5 mL/min (by C-G formula based on SCr of 0.72 mg/dL).   Assessment: 38 yo F admitted with CP. Pharmacy to start IV heparin. Trops negative, CBC wnl, no AC PTA.  Positive stress test with plans for cardiac cath 10/20.  Heparin level now >2.2.  Heparin level drawn from opposite arm.  Hg 10.4, PTLC 245  Goal of Therapy:  Heparin level 0.3-0.7 units/ml Monitor platelets by anticoagulation protocol: Yes   Plan:  Hold heparin for 1hr Decrease heparin gtt to 1500 units/hr F/u 6 hour heparin level  Thanks for allowing pharmacy to be a part of this patient's care.  Excell Seltzer, PharmD Clinical Pharmacist 09/14/2019 7:09 AM

## 2019-09-14 NOTE — Plan of Care (Signed)
Patient understand of plan

## 2019-09-14 NOTE — Progress Notes (Signed)
CSW consulted for patient's need for PCP, Alinda Sierras assisted with scheduling [facility] appointment for 10/29 at 8:50 am.   No further needs identified at this time.   [place], [place]

## 2019-09-22 NOTE — Progress Notes (Deleted)
Patient ID: Crystal Myers, female   DOB: 1980-12-31, 38 y.o.   MRN: [id number] Virtual Visit via Telephone Note  I connected with Crystal Myers on 09/23/19 at  8:50 AM EDT by telephone and verified that I am speaking with the correct person using two identifiers.   I discussed the limitations, risks, security and privacy concerns of performing an evaluation and management service by telephone and the availability of in person appointments. I also discussed with the patient that there may be a patient responsible charge related to this service. The patient expressed understanding and agreed to proceed.  Patient location:  home My Location:  [facility] office Persons on the call:  Me and the patient   History of Present Illness: After hospitalization 10/18-10/20/2020.  From discharge summary: History of present illness:  8/F with history of asthma, morbid obesity, intermittent chest pains since December 2019 presented to the emergency room yesterday with substernal chest pressure radiating down her left arm with some left arm tingling and numbness, she reports having intermittent left arm numbness for over a week  Hospital Course:   Chest pain -EKG was unremarkable, high-sensitivity troponin was negative x3, seen by cardiology Dr. Doylene Canard in consultation underwent a Myoview today which showed mild reversibility in the anterior wall, slightly low EF of 48%, left heart catheterization was recommended, this was completed today and showed normal coronaries, discharged home in a stable condition subsequently  Morbid obesity -Educated regarding diet, lifestyle modification, she is under the guidance of a nutritionist and working with her PCP on this, also has considered weight loss surgery in the past  Asthma -Stable, continue Dulera and as needed albuterol  Mild hyperglycemia - hemoglobin A1c is 5.1    Observations/Objective:   Assessment and Plan:   Follow Up Instructions:    I  discussed the assessment and treatment plan with the patient. The patient was provided an opportunity to ask questions and all were answered. The patient agreed with the plan and demonstrated an understanding of the instructions.   The patient was advised to call back or seek an in-person evaluation if the symptoms worsen or if the condition fails to improve as anticipated.  I provided *** minutes of non-face-to-face time during this encounter.   Freeman Caldron, PA-C

## 2019-09-23 ENCOUNTER — Ambulatory Visit: Payer: Medicaid Other | Attending: Family Medicine | Admitting: Physician Assistant

## 2019-09-23 ENCOUNTER — Other Ambulatory Visit: Payer: Self-pay

## 2019-11-06 ENCOUNTER — Emergency Department (HOSPITAL_COMMUNITY)
Admission: EM | Admit: 2019-11-06 | Discharge: 2019-11-07 | Disposition: A | Discharge status: Home / Self Care | Payer: Medicaid Other | Attending: Emergency Medicine | Admitting: Emergency Medicine

## 2019-11-06 ENCOUNTER — Other Ambulatory Visit: Payer: Self-pay

## 2019-11-06 ENCOUNTER — Encounter (HOSPITAL_COMMUNITY): Payer: Self-pay | Admitting: Emergency Medicine

## 2019-11-06 DIAGNOSIS — J45909 Unspecified asthma, uncomplicated: Secondary | ICD-10-CM | POA: Insufficient documentation

## 2019-11-06 DIAGNOSIS — I10 Essential (primary) hypertension: Secondary | ICD-10-CM | POA: Diagnosis not present

## 2019-11-06 DIAGNOSIS — R2242 Localized swelling, mass and lump, left lower limb: Secondary | ICD-10-CM | POA: Insufficient documentation

## 2019-11-06 DIAGNOSIS — Z87891 Personal history of nicotine dependence: Secondary | ICD-10-CM | POA: Insufficient documentation

## 2019-11-06 DIAGNOSIS — Z79899 Other long term (current) drug therapy: Secondary | ICD-10-CM | POA: Diagnosis not present

## 2019-11-06 DIAGNOSIS — R000 Tachycardia, unspecified: Secondary | ICD-10-CM | POA: Insufficient documentation

## 2019-11-06 DIAGNOSIS — R0602 Shortness of breath: Secondary | ICD-10-CM | POA: Diagnosis present

## 2019-11-06 DIAGNOSIS — M7989 Other specified soft tissue disorders: Secondary | ICD-10-CM

## 2019-11-06 NOTE — ED Triage Notes (Signed)
Pt in POV. Reports L foot swelling X1 day and L calf tightness. States her feet normally swell but subsides with elevation. Recent tattoo to L calf but appears to be healing well, no S/S of infection.

## 2019-11-07 ENCOUNTER — Emergency Department (HOSPITAL_COMMUNITY): Payer: Medicaid Other

## 2019-11-07 ENCOUNTER — Ambulatory Visit (HOSPITAL_COMMUNITY)
Admission: RE | Admit: 2019-11-07 | Discharge: 2019-11-07 | Disposition: A | Discharge status: Home / Self Care | Payer: Medicaid Other | Source: Ambulatory Visit | Attending: Family Medicine | Admitting: Family Medicine

## 2019-11-07 DIAGNOSIS — M7989 Other specified soft tissue disorders: Secondary | ICD-10-CM | POA: Insufficient documentation

## 2019-11-07 LAB — BASIC METABOLIC PANEL
Anion gap: 10 (ref 5–15)
BUN: 5 mg/dL — ABNORMAL LOW (ref 6–20)
CO2: 23 mmol/L (ref 22–32)
Calcium: 9.1 mg/dL (ref 8.9–10.3)
Chloride: 106 mmol/L (ref 98–111)
Creatinine, Ser: 0.77 mg/dL (ref 0.44–1.00)
GFR calc Af Amer: >60 mL/min (ref >60)
GFR calc non Af Amer: >60 mL/min (ref >60)
Glucose, Bld: 129 mg/dL — ABNORMAL HIGH (ref 70–99)
Potassium: 4 mmol/L (ref 3.5–5.1)
Sodium: 139 mmol/L (ref 135–145)

## 2019-11-07 LAB — CBC
HCT: 39.1 % (ref 36.0–46.0)
Hemoglobin: 11.6 g/dL — ABNORMAL LOW (ref 12.0–15.0)
MCH: 18.7 pg — ABNORMAL LOW (ref 26.0–34.0)
MCHC: 29.7 g/dL — ABNORMAL LOW (ref 30.0–36.0)
MCV: 63 fL — ABNORMAL LOW (ref 80.0–100.0)
Platelets: 298 10*3/uL (ref 150–400)
RBC: 6.21 MIL/uL — ABNORMAL HIGH (ref 3.87–5.11)
RDW: 17 % — ABNORMAL HIGH (ref 11.5–15.5)
WBC: 7.1 10*3/uL (ref 4.0–10.5)
nRBC: 0 % (ref 0.0–0.2)

## 2019-11-07 LAB — URINALYSIS, ROUTINE W REFLEX MICROSCOPIC
Bilirubin Urine: NEGATIVE
Glucose, UA: NEGATIVE mg/dL
Hgb urine dipstick: NEGATIVE
Ketones, ur: NEGATIVE mg/dL
Nitrite: NEGATIVE
Protein, ur: NEGATIVE mg/dL
Specific Gravity, Urine: 1.025 (ref 1.005–1.030)
pH: 6 (ref 5.0–8.0)

## 2019-11-07 LAB — D-DIMER, QUANTITATIVE: D-Dimer, Quant: 0.63 ug/mL-FEU — ABNORMAL HIGH (ref 0.00–0.50)

## 2019-11-07 LAB — I-STAT BETA HCG BLOOD, ED (MC, WL, AP ONLY): I-stat hCG, quantitative: <5 m[IU]/mL (ref <5)

## 2019-11-07 LAB — BRAIN NATRIURETIC PEPTIDE: B Natriuretic Peptide: 17.6 pg/mL (ref 0.0–100.0)

## 2019-11-07 MED ORDER — IOHEXOL 350 MG/ML SOLN
100.0000 mL | Freq: Once | INTRAVENOUS | Status: AC | PRN
  Administered 2019-11-07: 100 mL via INTRAVENOUS

## 2019-11-07 MED ORDER — ENOXAPARIN SODIUM 150 MG/ML ~~LOC~~ SOLN
1.0000 mg/kg | Freq: Once | SUBCUTANEOUS | Status: AC
  Administered 2019-11-07: 06:00:00 125 mg via SUBCUTANEOUS
  Filled 2019-11-07: qty 0.83

## 2019-11-07 NOTE — ED Notes (Signed)
Pt transported to CT ?

## 2019-11-07 NOTE — Discharge Instructions (Signed)
1. Medications: usual home medications 2. Treatment: rest, drink plenty of fluids,  3. Follow Up: Please followup with your primary doctor in 2-3 days for discussion of your diagnoses and further evaluation after today's visit; if you do not have a primary care doctor use the resource guide provided to find one; Please return to the ER for worsening shortness of breath, fevers, leg pain or other concerns.    IMPORTANT PATIENT INSTRUCTIONS:  You have been scheduled for an Outpatient Vascular Study at [facility].    If tomorrow is a Saturday or Sunday, please go to the [facility] Emergency Department Registration Desk at 8 am tomorrow morning and tell them you are there for a vascular study.  If tomorrow is a weekday (Monday-Friday), please go to [facility] Admitting Department at 8 am and tell them you are  there for a vascular study.

## 2019-11-07 NOTE — ED Notes (Signed)
Pt back from CT

## 2019-11-07 NOTE — ED Provider Notes (Signed)
[facility] EMERGENCY DEPARTMENT Provider Note   CSN: [id number] Arrival date & time: 11/06/19  2336     History Chief Complaint  Patient presents with  . Foot Swelling    Crystal Myers is a 38 y.o. female with a hx of migraine, depression, asthma, sleep apnea, GERD, hypertension presents to the Emergency Department complaining of gradual, persistent, progressively worsening swelling of her left leg onset approximately 24 hours ago.  Patient reports a history of " heart problems" and is followed by [facility] cardiology.  She reports an abnormal EKG in the past.  She denies a diagnosis of CHF but does report that occasionally she has peripheral edema.  She reports this is usually bilateral and disappears with elevation however today swelling is unilateral and has not improved with elevation or rest.  Patient reports she has palpitations and shortness of breath today.  She reports symptoms of palpitations and shortness of breath have been intermittent for the last several years.  She reports family history of early sudden cardiac death and CHF.  Patient reports she is concerned about the swelling in her leg.  She reports 3 days ago she had a tattoo to the left calf but denies redness, swelling or drainage from the site.  She denies fevers or chills.  She denies calf pain.  She reports that her leg feels tight.  No treatment prior to arrival.  No specific aggravating or alleviating factors.   The history is provided by the patient and medical records. No language interpreter was used.       Past Medical History:  Diagnosis Date  . ADHD   . Anemia   . Anxiety   . Asthma   . Chest pain   . Chlamydia   . Depression   . Headache   . Hypertension     Patient Active Problem List   Diagnosis Date Noted  . Essential hypertension 09/12/2019  . Acute coronary syndrome (HCC) 11/21/2018  . Chest pain 11/19/2018  . GERD (gastroesophageal reflux disease)  11/19/2018  . OSA (obstructive sleep apnea) 05/13/2018  . Dysphagia 05/13/2018  . Perennial and seasonal allergic rhinitis 07/08/2016  . Allergic conjunctivitis 07/08/2016  . Moderate persistent asthma 07/08/2016  . Generalized pruritus 07/08/2016  . Depression 12/24/2014  . Acute calculous cholecystitis s/p lap chole 12/24/2014 12/23/2014  . Migraine 02/11/2013    Past Surgical History:  Procedure Laterality Date  . ADENOIDECTOMY    . APPENDECTOMY    . CERVICAL BIOPSY  W/ LOOP ELECTRODE EXCISION    . CERVICAL POLYPECTOMY    . CESAREAN SECTION    . CESAREAN SECTION     x4  . CHOLECYSTECTOMY N/A 12/24/2014   Procedure: LAPAROSCOPIC CHOLECYSTECTOMY WITH INTRAOPERATIVE CHOLANGIOGRAM;  Surgeon: Glenna Fellows, MD;  Location: [facility] ORS;  Service: General;  Laterality: N/A;  . DILATION AND CURETTAGE OF UTERUS    . LEFT HEART CATH AND CORONARY ANGIOGRAPHY N/A 09/14/2019   Procedure: LEFT HEART CATH AND CORONARY ANGIOGRAPHY;  Surgeon: Orpah Cobb, MD;  Location: [facility] INVASIVE CV LAB;  Service: Cardiovascular;  Laterality: N/A;  . LYMPHADENECTOMY    . TONSILLECTOMY    . TUBAL LIGATION       OB History    Gravida  5   Para  5   Term  3   Preterm  2   AB      Living  3     SAB      TAB  Ectopic      Multiple      Live Births  4           Family History  Problem Relation Age of Onset  . Diabetes Mother   . Hypertension Mother   . CAD Mother   . Allergic rhinitis Mother   . Asthma Sister     Social History   Tobacco Use  . Smoking status: Former Smoker    Packs/day: 0.50    Types: Cigarettes    Quit date: 11/25/2014    Years since quitting: 4.9  . Smokeless tobacco: Never Used  Substance Use Topics  . Alcohol use: No    Alcohol/week: 0.0 standard drinks    Comment: former  . Drug use: No    Home Medications Prior to Admission medications   Medication Sig Start Date End Date Taking? Authorizing Provider  albuterol (PROAIR HFA) 108 (90 Base)  MCG/ACT inhaler Inhale 2 puffs into the lungs every 4 (four) hours as needed for wheezing or shortness of breath. 08/19/18   Oretha Milch, MD  atorvastatin (LIPITOR) 40 MG tablet Take 1 tablet (40 mg total) by mouth daily at 6 PM. Patient not taking: Reported on 09/12/2019 07/19/19   Bethel Born, PA-C  citalopram (CELEXA) 20 MG tablet Take 20 mg by mouth daily.     [provider]  EPINEPHrine (EPIPEN JR) 0.15 MG/0.3ML injection INJECT 0.3 MLS (0.15 MG TOTAL) INTO THE MUSCLE AS NEEDED FOR ANAPHYLAXIS. Patient not taking: Reported on 09/12/2019 12/16/18   Oretha Milch, MD  EPINEPHRINE 0.3 mg/0.3 mL IJ SOAJ injection INJECT 0.3 MG INTO THE MUSCLE AS NEEDED FOR ANAPHYLAXIS Patient taking differently: Inject 0.3 mg into the muscle once as needed for anaphylaxis.  03/08/19   Oretha Milch, MD  guanFACINE (INTUNIV) 1 MG TB24 ER tablet Take 1 mg by mouth daily. 05/13/18   [provider]  ipratropium-albuterol (DUONEB) 0.5-2.5 (3) MG/3ML SOLN Take 3 mLs by nebulization every 2 (two) hours as needed. Patient not taking: Reported on 09/12/2019 10/03/15   Barrett, Rolm Gala, PA-C  Levonorgestrel-Ethinyl Estradiol (CAMRESE) 0.15-0.03 &0.01 MG tablet Take 1 tablet by mouth daily. 05/14/18   Constant, Peggy, MD  levothyroxine (SYNTHROID, LEVOTHROID) 25 MCG tablet Take 25 mcg by mouth daily. 04/30/18   [provider]  metoprolol succinate (TOPROL-XL) 25 MG 24 hr tablet Take 1 tablet (25 mg total) by mouth daily. 07/19/19   Bethel Born, PA-C  mometasone-formoterol (DULERA) 200-5 MCG/ACT AERO Inhale 2 puffs into the lungs 2 (two) times daily. 08/19/18   Oretha Milch, MD  nitroGLYCERIN (NITROSTAT) 0.4 MG SL tablet Place 0.4 mg under the tongue every 5 (five) minutes as needed for chest pain.    [provider]  pantoprazole (PROTONIX) 20 MG tablet Take 20 mg by mouth daily.    [provider]  traZODone (DESYREL) 50 MG tablet Take 50 mg by mouth at bedtime as  needed for sleep.    [provider]  Vitamin D, Ergocalciferol, (DRISDOL) 1.25 MG (50000 UT) CAPS capsule Take 50,000 Units by mouth every Saturday.    [provider]  VYVANSE 40 MG capsule Take 40 mg by mouth daily. 09/01/17   [provider]    Allergies    Ferrous gluconate, Adhesive [tape], Penicillins, Chantix [varenicline], Dust mite mixed allergen ext [mite (d. farinae)], Other, Oxycodone, Amoxapine, and Augmentin [amoxicillin-pot clavulanate]  Review of Systems   Review of Systems  Constitutional: Negative for appetite change,  diaphoresis, fatigue, fever and unexpected weight change.  HENT: Negative for mouth sores.   Eyes: Negative for visual disturbance.  Respiratory: Positive for shortness of breath. Negative for cough, chest tightness and wheezing.   Cardiovascular: Positive for palpitations and leg swelling. Negative for chest pain.  Gastrointestinal: Negative for abdominal pain, constipation, diarrhea, nausea and vomiting.  Endocrine: Negative for polydipsia, polyphagia and polyuria.  Genitourinary: Negative for dysuria, frequency, hematuria and urgency.  Musculoskeletal: Negative for back pain and neck stiffness.  Skin: Negative for rash.  Allergic/Immunologic: Negative for immunocompromised state.  Neurological: Negative for syncope, light-headedness and headaches.  Hematological: Does not bruise/bleed easily.  Psychiatric/Behavioral: Negative for sleep disturbance. The patient is not nervous/anxious.     Physical Exam Updated Vital Signs BP (!) 115/97 (BP Location: Left Arm)   Pulse (!) 131   Temp 98.5 F (36.9 C) (Oral)   Resp (!) 22   Ht 5\' 8"  (1.727 m)   Wt 122.5 kg   SpO2 100%   BMI 41.05 kg/m   Physical Exam Vitals and nursing note reviewed.  Constitutional:      General: She is not in acute distress.    Appearance: She is not diaphoretic.  HENT:     Head: Normocephalic.  Eyes:     General: No scleral icterus.     Conjunctiva/sclera: Conjunctivae normal.  Cardiovascular:     Rate and Rhythm: Regular rhythm. Tachycardia present.     Pulses: Normal pulses.          Radial pulses are 2+ on the right side and 2+ on the left side.  Pulmonary:     Effort: Tachypnea present. No accessory muscle usage, prolonged expiration, respiratory distress or retractions.     Breath sounds: No stridor.     Comments: Equal chest rise. Mild increased work of breathing. Patient with tachypnea and speaking in short sentences after moving around. Abdominal:     General: There is no distension.     Palpations: Abdomen is soft.     Tenderness: There is no abdominal tenderness. There is no guarding or rebound.  Musculoskeletal:     Cervical back: Normal range of motion.     Right lower leg: No swelling. No edema.     Left lower leg: Swelling present. 2+ Edema present.       Legs:     Comments: Moves all extremities equally and without difficulty. 2+ pitting edema of the left forefoot, ankle and midway up the calf.  Skin:    General: Skin is warm and dry.     Capillary Refill: Capillary refill takes less than 2 seconds.  Neurological:     Mental Status: She is alert.     GCS: GCS eye subscore is 4. GCS verbal subscore is 5. GCS motor subscore is 6.     Comments: Speech is clear and goal oriented.  Psychiatric:        Mood and Affect: Mood normal.     ED Results / Procedures / Treatments   Labs (all labs ordered are listed, but only abnormal results are displayed) Labs Reviewed  CBC - Abnormal; Notable for the following components:      Result Value   RBC 6.21 (*)    Hemoglobin 11.6 (*)    MCV 63.0 (*)    MCH 18.7 (*)    MCHC 29.7 (*)    RDW 17.0 (*)    All other components within normal limits  BASIC METABOLIC PANEL - Abnormal; Notable for  the following components:   Glucose, Bld 129 (*)    BUN 5 (*)    All other components within normal limits  URINALYSIS, ROUTINE W REFLEX MICROSCOPIC - Abnormal;  Notable for the following components:   APPearance HAZY (*)    Leukocytes,Ua TRACE (*)    Bacteria, UA RARE (*)    All other components within normal limits  D-DIMER, QUANTITATIVE (NOT AT [facility]) - Abnormal; Notable for the following components:   D-Dimer, Quant 0.63 (*)    All other components within normal limits  BRAIN NATRIURETIC PEPTIDE  I-STAT BETA HCG BLOOD, ED ([facility], [facility], [facility] ONLY)    EKG EKG Interpretation  Date/Time:  Sunday November 07 2019 04:17:46 EST Ventricular Rate:  99 PR Interval:    QRS Duration: 156 QT Interval:  378 QTC Calculation: 486 R Axis:   16 Text Interpretation: Sinus tachycardia Nonspecific intraventricular conduction delay Probable inferior infarct, age indeterminate Confirmed by Drema Pryardama, Pedro [phone]([id number]) on 11/07/2019 4:29:20 AM     Radiology CT Angio Chest PE W and/or Wo Contrast  Result Date: 11/07/2019 CLINICAL DATA:  Shortness of breath EXAM: CT ANGIOGRAPHY CHEST WITH CONTRAST TECHNIQUE: Multidetector CT imaging of the chest was performed using the standard protocol during bolus administration of intravenous contrast. Multiplanar CT image reconstructions and MIPs were obtained to evaluate the vascular anatomy. CONTRAST:  100mL OMNIPAQUE IOHEXOL 350 MG/ML SOLN COMPARISON:  CTA chest 11/20/2018 FINDINGS: Cardiovascular: --Pulmonary arteries: Contrast injection is sufficient to demonstrate satisfactory opacification of the pulmonary arteries to the segmental level, with attenuation of at least 200 HU at the main pulmonary artery. There is no pulmonary embolus. The main pulmonary artery is within normal limits for size. --Aorta: Satisfactory opacification of the thoracic aorta. No aortic dissection or other acute aortic syndrome. Conventional 3 vessel aortic branching pattern. There is no aortic atherosclerosis. --Heart: Normal size. No pericardial effusion. Mediastinum/Nodes: No mediastinal, hilar or axillary lymphadenopathy. The visualized thyroid and thoracic  esophageal course are unremarkable. Lungs/Pleura: No pulmonary nodules or masses. No pleural effusion or pneumothorax. No focal airspace consolidation. No focal pleural abnormality. There is a small intrapulmonary lymph node along the minor fissure. Upper Abdomen: Contrast bolus timing is not optimized for evaluation of the abdominal organs. The visualized portions of the organs of the upper abdomen are normal. Musculoskeletal: No chest wall abnormality. No bony spinal canal stenosis. Review of the MIP images confirms the above findings. IMPRESSION: No pulmonary embolus or other acute thoracic abnormality. Electronically Signed   By: Deatra RobinsonKevin  Herman M.D.   On: 11/07/2019 04:46   DG Chest Port 1 View  Result Date: 11/07/2019 CLINICAL DATA:  Shortness of breath and tachycardia EXAM: PORTABLE CHEST 1 VIEW COMPARISON:  09/12/2019 FINDINGS: The heart size and mediastinal contours are within normal limits. Both lungs are clear. The visualized skeletal structures are unremarkable. IMPRESSION: No active disease. Electronically Signed   By: Alcide CleverMark  Lukens M.D.   On: 11/07/2019 03:44    Procedures Procedures (including critical care time)  Medications Ordered in ED Medications  iohexol (OMNIPAQUE) 350 MG/ML injection 100 mL (100 mLs Intravenous Contrast Given 11/07/19 0430)  enoxaparin (LOVENOX) injection 125 mg (125 mg Subcutaneous Given 11/07/19 [id number])    ED Course  I have reviewed the triage vital signs and the nursing notes.  Pertinent labs & imaging results that were available during my care of the patient were reviewed by me and considered in my medical decision making (see chart for details).  Clinical Course as of Nov 06 613  Sun Nov 07, 2019  0323 Baseline  Hemoglobin(!): 11.6 [HM]  0433 Trace leukocytes but no white blood cells and rare bacteria.  Suspect contamination.  Patient without urinary symptoms.  Leukocytes,Ua(!): TRACE [HM]  [id number] X-ray without groundglass opacities or pneumonia.  No  widened mediastinum.  Personally evaluated these images.  DG Chest Port 1 View [HM]  [phone] Heart Rate improving  Pulse Rate(!): 104 [HM]  0515 D-dimer slightly elevated  D-Dimer, Quant(!): 0.63 [HM]  0515 No evidence of pulmonary embolism.  Personally evaluated these images.  CT Angio Chest PE W and/or Wo Contrast [HM]    Clinical Course User Index [HM] Kassidy Frankson, Boyd Kerbs   MDM Rules/Calculators/A&P                           Patient presents to the emergency department with left leg swelling, palpitations and shortness of breath.  Some of her symptoms have been ongoing however the left leg swelling is new.  I do have some concern for pulmonary embolism.  Patient currently takes oral contraceptives and reports intermittent days of staying in bed due to fatigue.  She is not a current smoker but is a previous smoker.  Labs and CT angiogram pending.   6:14 AM Labs are reassuring.  BNP is not elevated.  EKG without acute coronary syndrome.  D-dimer is slightly elevated, however CT angios without evidence of pulmonary embolus.  Tachycardia has resolved while here in the emergency department.  There are no signs of cellulitis.  Will schedule outpatient venous duplex for evaluation of possible left lower extremity DVT.  Patient will need close follow-up with her primary care provider and outpatient cardiologist.  She is stable for discharge.  Discussed reasons to return immediately to the emergency department.  Patient states understanding and is in agreement with the plan.  Final Clinical Impression(s) / ED Diagnoses Final diagnoses:  Left leg swelling  Tachycardia    Rx / DC Orders ED Discharge Orders         Ordered    [facility] Outpatient     11/07/19 0613           Milta Deiters 11/07/19 [id number]    Nira Conn, MD 11/07/19 [phone]

## 2019-11-07 NOTE — ED Notes (Signed)
Patient transported to CT 

## 2019-11-07 NOTE — Progress Notes (Signed)
VASCULAR LAB PRELIMINARY  PRELIMINARY  PRELIMINARY  PRELIMINARY  Left lower extremity venous duplex completed.    Preliminary report:  See CV proc for preliminary results.   Darl Brisbin, RVT 11/07/2019, 8:21 AM

## 2019-11-07 NOTE — ED Notes (Signed)
XR at bedside

## 2019-11-07 NOTE — ED Notes (Signed)
Pt given a coke 

## 2019-12-04 ENCOUNTER — Other Ambulatory Visit: Payer: Self-pay

## 2019-12-04 ENCOUNTER — Emergency Department (HOSPITAL_COMMUNITY)
Admission: EM | Admit: 2019-12-04 | Discharge: 2019-12-04 | Disposition: A | Discharge status: Home / Self Care | Payer: Medicaid Other | Attending: Emergency Medicine | Admitting: Emergency Medicine

## 2019-12-04 ENCOUNTER — Encounter (HOSPITAL_COMMUNITY): Payer: Self-pay | Admitting: *Deleted

## 2019-12-04 ENCOUNTER — Emergency Department (HOSPITAL_COMMUNITY): Payer: Medicaid Other

## 2019-12-04 DIAGNOSIS — Z79899 Other long term (current) drug therapy: Secondary | ICD-10-CM | POA: Insufficient documentation

## 2019-12-04 DIAGNOSIS — D509 Iron deficiency anemia, unspecified: Secondary | ICD-10-CM

## 2019-12-04 DIAGNOSIS — S1093XA Contusion of unspecified part of neck, initial encounter: Secondary | ICD-10-CM | POA: Diagnosis not present

## 2019-12-04 DIAGNOSIS — I10 Essential (primary) hypertension: Secondary | ICD-10-CM | POA: Insufficient documentation

## 2019-12-04 DIAGNOSIS — Y9389 Activity, other specified: Secondary | ICD-10-CM | POA: Insufficient documentation

## 2019-12-04 DIAGNOSIS — Z87891 Personal history of nicotine dependence: Secondary | ICD-10-CM | POA: Insufficient documentation

## 2019-12-04 DIAGNOSIS — W1839XA Other fall on same level, initial encounter: Secondary | ICD-10-CM | POA: Diagnosis not present

## 2019-12-04 DIAGNOSIS — R0789 Other chest pain: Secondary | ICD-10-CM | POA: Diagnosis not present

## 2019-12-04 DIAGNOSIS — E876 Hypokalemia: Secondary | ICD-10-CM | POA: Diagnosis not present

## 2019-12-04 DIAGNOSIS — S199XXA Unspecified injury of neck, initial encounter: Secondary | ICD-10-CM | POA: Diagnosis present

## 2019-12-04 DIAGNOSIS — J45909 Unspecified asthma, uncomplicated: Secondary | ICD-10-CM | POA: Diagnosis not present

## 2019-12-04 DIAGNOSIS — Y999 Unspecified external cause status: Secondary | ICD-10-CM | POA: Diagnosis not present

## 2019-12-04 DIAGNOSIS — R55 Syncope and collapse: Secondary | ICD-10-CM

## 2019-12-04 DIAGNOSIS — Y929 Unspecified place or not applicable: Secondary | ICD-10-CM | POA: Diagnosis not present

## 2019-12-04 LAB — BASIC METABOLIC PANEL
Anion gap: 13 (ref 5–15)
BUN: 11 mg/dL (ref 6–20)
CO2: 24 mmol/L (ref 22–32)
Calcium: 8.5 mg/dL — ABNORMAL LOW (ref 8.9–10.3)
Chloride: 100 mmol/L (ref 98–111)
Creatinine, Ser: 0.81 mg/dL (ref 0.44–1.00)
GFR calc Af Amer: >60 mL/min (ref >60)
GFR calc non Af Amer: >60 mL/min (ref >60)
Glucose, Bld: 117 mg/dL — ABNORMAL HIGH (ref 70–99)
Potassium: 2.9 mmol/L — ABNORMAL LOW (ref 3.5–5.1)
Sodium: 137 mmol/L (ref 135–145)

## 2019-12-04 LAB — CBC
HCT: 37.2 % (ref 36.0–46.0)
Hemoglobin: 11.6 g/dL — ABNORMAL LOW (ref 12.0–15.0)
MCH: 19.4 pg — ABNORMAL LOW (ref 26.0–34.0)
MCHC: 31.2 g/dL (ref 30.0–36.0)
MCV: 62.3 fL — ABNORMAL LOW (ref 80.0–100.0)
Platelets: 265 10*3/uL (ref 150–400)
RBC: 5.97 MIL/uL — ABNORMAL HIGH (ref 3.87–5.11)
RDW: 17.3 % — ABNORMAL HIGH (ref 11.5–15.5)
WBC: 8.8 10*3/uL (ref 4.0–10.5)
nRBC: 0 % (ref 0.0–0.2)

## 2019-12-04 LAB — I-STAT BETA HCG BLOOD, ED (MC, WL, AP ONLY): I-stat hCG, quantitative: <5 m[IU]/mL (ref <5)

## 2019-12-04 LAB — TROPONIN I (HIGH SENSITIVITY)
Troponin I (High Sensitivity): <2 ng/L (ref <18)
Troponin I (High Sensitivity): <2 ng/L (ref <18)

## 2019-12-04 LAB — MAGNESIUM: Magnesium: 1.9 mg/dL (ref 1.7–2.4)

## 2019-12-04 MED ORDER — POTASSIUM CHLORIDE CRYS ER 20 MEQ PO TBCR: 40.0000 meq | EXTENDED_RELEASE_TABLET | Freq: Two times a day (BID) | ORAL | 0 refills | Status: AC

## 2019-12-04 MED ORDER — KETOROLAC TROMETHAMINE 15 MG/ML IJ SOLN
15.0000 mg | Freq: Once | INTRAMUSCULAR | Status: AC
  Administered 2019-12-04: 08:00:00 15 mg via INTRAVENOUS
  Filled 2019-12-04: qty 1

## 2019-12-04 MED ORDER — SODIUM CHLORIDE 0.9% FLUSH: 3.0000 mL | Freq: Once | INTRAVENOUS | Status: DC

## 2019-12-04 MED ORDER — POTASSIUM CHLORIDE CRYS ER 20 MEQ PO TBCR
40.0000 meq | EXTENDED_RELEASE_TABLET | Freq: Once | ORAL | Status: AC
  Administered 2019-12-04: 08:00:00 40 meq via ORAL
  Filled 2019-12-04: qty 2

## 2019-12-04 MED ORDER — LACTATED RINGERS IV BOLUS
1000.0000 mL | Freq: Once | INTRAVENOUS | Status: AC
  Administered 2019-12-04: 08:00:00 1000 mL via INTRAVENOUS

## 2019-12-04 MED ORDER — POTASSIUM CHLORIDE 10 MEQ/100ML IV SOLN
10.0000 meq | Freq: Once | INTRAVENOUS | Status: AC
  Administered 2019-12-04: 10:00:00 10 meq via INTRAVENOUS
  Filled 2019-12-04: qty 100

## 2019-12-04 NOTE — Discharge Instructions (Addendum)
If you develop recurrent, continued, or worsening chest pain, shortness of breath, fever, vomiting, abdominal or back pain, or any other new/concerning symptoms then return to the ER for evaluation.  

## 2019-12-04 NOTE — ED Notes (Signed)
Patient transported to X-ray 

## 2019-12-04 NOTE — ED Triage Notes (Signed)
Pt reporting she felt dizzy and nauseated, got up to walk and she said she woke up on the floor. Pt says that she has been having chest pain and numbness in her arms.

## 2019-12-04 NOTE — ED Provider Notes (Signed)
Care transferred to me.  Two troponins negative.  Potassium moderately low at 2.9, magnesium normal.  QTC right around 500.  She has chronic PVCs that is unchanged.  She was given oral and IV potassium.  Is unclear why her potassium is low but perhaps this is making her feel poorly.  At this point, given she can tolerate oral potassium I think she is okay to be discharged on home regimen and follow-up with her PCP.  Discussed return precautions.   Pricilla Loveless, MD 12/04/19 1012

## 2019-12-04 NOTE — ED Notes (Signed)
Patient verbalizes understanding of discharge instructions . Opportunity for questions and answers were provided . Armband removed by staff ,Pt discharged from ED. W/C  offered at D/C  and Declined W/C at D/C and was escorted to lobby by RN.  

## 2019-12-04 NOTE — ED Provider Notes (Signed)
[facility] EMERGENCY DEPARTMENT Provider Note   CSN: [id number] Arrival date & time: 12/04/19  0277   History Chief complaint: Chest pain, syncope  Crystal Myers is a 39 y.o. female.  The history is provided by the patient.  She has a history of attention deficit disorder, hypertension, hyperlipidemia, chest pain syndrome and comes in following a syncopal episode this morning.  She continues to have episodes of chest pain and current episode has been lasting about 3days.  Pain is across her chest and does radiate into her left arm.  There is mild associated dyspnea but no nausea or diaphoresis.  This morning, she got up to check on her daughter and she noted that she was sick to her stomach and feeling lightheaded and she is reported to have had a syncopal episode and hit her neck on a dresser.  She denies diaphoresis.  EMS transported her and gave her nitroglycerin which resulted in a drop in blood pressure.  She continues to have chest pain.  This has been investigated extensively including with cardiac catheterization.  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  She is a former smoker.  There is no history of diabetes.  Pain is currently rated at 6/10.  Past Medical History:  Diagnosis Date  . ADHD   . Anemia   . Anxiety   . Asthma   . Chest pain   . Chlamydia   . Depression   . Headache   . Hypertension     Patient Active Problem List   Diagnosis Date Noted  . Essential hypertension 09/12/2019  . Acute coronary syndrome (HCC) 11/21/2018  . Chest pain 11/19/2018  . GERD (gastroesophageal reflux disease) 11/19/2018  . OSA (obstructive sleep apnea) 05/13/2018  . Dysphagia 05/13/2018  . Perennial and seasonal allergic rhinitis 07/08/2016  . Allergic conjunctivitis 07/08/2016  . Moderate persistent asthma 07/08/2016  . Generalized pruritus 07/08/2016  . Depression 12/24/2014  . Acute calculous cholecystitis s/p lap chole  12/24/2014 12/23/2014  . Migraine 02/11/2013    Past Surgical History:  Procedure Laterality Date  . ADENOIDECTOMY    . APPENDECTOMY    . CERVICAL BIOPSY  W/ LOOP ELECTRODE EXCISION    . CERVICAL POLYPECTOMY    . CESAREAN SECTION    . CESAREAN SECTION     x4  . CHOLECYSTECTOMY N/A 12/24/2014   Procedure: LAPAROSCOPIC CHOLECYSTECTOMY WITH INTRAOPERATIVE CHOLANGIOGRAM;  Surgeon: Glenna Fellows, MD;  Location: [facility] ORS;  Service: General;  Laterality: N/A;  . DILATION AND CURETTAGE OF UTERUS    . LEFT HEART CATH AND CORONARY ANGIOGRAPHY N/A 09/14/2019   Procedure: LEFT HEART CATH AND CORONARY ANGIOGRAPHY;  Surgeon: Orpah Cobb, MD;  Location: [facility] INVASIVE CV LAB;  Service: Cardiovascular;  Laterality: N/A;  . LYMPHADENECTOMY    . TONSILLECTOMY    . TUBAL LIGATION       OB History    Gravida  5   Para  5   Term  3   Preterm  2   AB      Living  3     SAB      TAB      Ectopic      Multiple      Live Births  4           Family History  Problem Relation Age of Onset  . Diabetes Mother   . Hypertension Mother   . CAD Mother   . Allergic rhinitis  Mother   . Asthma Sister     Social History   Tobacco Use  . Smoking status: Former Smoker    Packs/day: 0.50    Types: Cigarettes    Quit date: 11/25/2014    Years since quitting: 5.0  . Smokeless tobacco: Never Used  Substance Use Topics  . Alcohol use: No    Alcohol/week: 0.0 standard drinks    Comment: former  . Drug use: No    Home Medications Prior to Admission medications   Medication Sig Start Date End Date Taking? Authorizing Provider  albuterol (PROAIR HFA) 108 (90 Base) MCG/ACT inhaler Inhale 2 puffs into the lungs every 4 (four) hours as needed for wheezing or shortness of breath. 08/19/18   Oretha Milch, MD  atorvastatin (LIPITOR) 40 MG tablet Take 1 tablet (40 mg total) by mouth daily at 6 PM. Patient not taking: Reported on 09/12/2019 07/19/19   Bethel Born, PA-C  citalopram  (CELEXA) 20 MG tablet Take 20 mg by mouth daily.     [provider]  EPINEPHrine (EPIPEN JR) 0.15 MG/0.3ML injection INJECT 0.3 MLS (0.15 MG TOTAL) INTO THE MUSCLE AS NEEDED FOR ANAPHYLAXIS. Patient not taking: Reported on 09/12/2019 12/16/18   Oretha Milch, MD  EPINEPHRINE 0.3 mg/0.3 mL IJ SOAJ injection INJECT 0.3 MG INTO THE MUSCLE AS NEEDED FOR ANAPHYLAXIS Patient taking differently: Inject 0.3 mg into the muscle once as needed for anaphylaxis.  03/08/19   Oretha Milch, MD  guanFACINE (INTUNIV) 1 MG TB24 ER tablet Take 1 mg by mouth daily. 05/13/18   [provider]  ipratropium-albuterol (DUONEB) 0.5-2.5 (3) MG/3ML SOLN Take 3 mLs by nebulization every 2 (two) hours as needed. Patient not taking: Reported on 09/12/2019 10/03/15   Barrett, Rolm Gala, PA-C  Levonorgestrel-Ethinyl Estradiol (CAMRESE) 0.15-0.03 &0.01 MG tablet Take 1 tablet by mouth daily. 05/14/18   Constant, Peggy, MD  levothyroxine (SYNTHROID, LEVOTHROID) 25 MCG tablet Take 25 mcg by mouth daily. 04/30/18   [provider]  metoprolol succinate (TOPROL-XL) 25 MG 24 hr tablet Take 1 tablet (25 mg total) by mouth daily. 07/19/19   Bethel Born, PA-C  mometasone-formoterol (DULERA) 200-5 MCG/ACT AERO Inhale 2 puffs into the lungs 2 (two) times daily. 08/19/18   Oretha Milch, MD  nitroGLYCERIN (NITROSTAT) 0.4 MG SL tablet Place 0.4 mg under the tongue every 5 (five) minutes as needed for chest pain.    [provider]  pantoprazole (PROTONIX) 20 MG tablet Take 20 mg by mouth daily.    [provider]  traZODone (DESYREL) 50 MG tablet Take 50 mg by mouth at bedtime as needed for sleep.    [provider]  Vitamin D, Ergocalciferol, (DRISDOL) 1.25 MG (50000 UT) CAPS capsule Take 50,000 Units by mouth every Saturday.    [provider]  VYVANSE 40 MG capsule Take 40 mg by mouth daily. 09/01/17   [provider]    Allergies    Ferrous gluconate, Adhesive [tape],  Penicillins, Chantix [varenicline], Dust mite mixed allergen ext [mite (d. farinae)], Other, Oxycodone, Amoxapine, and Augmentin [amoxicillin-pot clavulanate]  Review of Systems   Review of Systems  All other systems reviewed and are negative.   Physical Exam Updated Vital Signs BP (!) 106/48 (BP Location: Right Arm)   Pulse 95   Temp 97.9 F (36.6 C) (Oral)   Resp 16   Ht 5\' 8"  (1.727 m)   Wt 122.5 kg   SpO2 98%   BMI 41.05 kg/m  Physical Exam Vitals and nursing note reviewed.   39 year old female, resting comfortably and in no acute distress. Vital signs are normal. Oxygen saturation is 98%, which is normal. Head is normocephalic and atraumatic. PERRLA, EOMI. Oropharynx is clear. Neck is immobilized in a soft cervical collar and is mildly tender in the midline without adenopathy or JVD. Back is nontender and there is no CVA tenderness. Lungs are clear without rales, wheezes, or rhonchi. Chest is nontender. Heart has regular rate and rhythm with occasional skipped beats.  There is no murmur. Abdomen is soft, flat, nontender without masses or hepatosplenomegaly and peristalsis is normoactive. Extremities have no cyanosis or edema, full range of motion is present. Skin is warm and dry without rash. Neurologic: Mental status is normal, cranial nerves are intact, there are no motor or sensory deficits.  ED Results / Procedures / Treatments   Labs (all labs ordered are listed, but only abnormal results are displayed) Labs Reviewed  BASIC METABOLIC PANEL  CBC  I-STAT BETA HCG BLOOD, ED ([place], [facility], [facility] ONLY)  TROPONIN I (HIGH SENSITIVITY)    EKG EKG Interpretation  Date/Time:  Saturday December 04 2019 06:11:10 EST Ventricular Rate:  97 PR Interval:    QRS Duration: 96 QT Interval:  401 QTC Calculation: 510 R Axis:   2 Text Interpretation: Sinus rhythm Multiple ventricular premature complexes Low voltage, precordial leads Abnormal inferior Q waves Borderline T wave  abnormalities Borderline prolonged QT interval When compared with ECG of 11/07/2019, QT has lengthened Premature ventricular complexes are now present Reconfirmed by Delora Fuel ([id number]) on 12/04/2019 6:42:30 AM   Radiology No results found.  Procedures Procedures  Medications Ordered in ED Medications  sodium chloride flush (NS) 0.9 % injection 3 mL (has no administration in time range)    ED Course  I have reviewed the triage vital signs and the nursing notes.  Pertinent labs & imaging results that were available during my care of the patient were reviewed by me and considered in my medical decision making (see chart for details).  MDM Rules/Calculators/A&P Chest pain as part of in a chronic chest pain syndrome.  Syncopal episode appears to have been a vasovagal episode.  ECG is significant only for PVCs and borderline prolonged QT interval.  Old records are reviewed, and she had cardiac catheterization on September 14, 2019 with normal coronary arteries identified.  She also had an echocardiogram at [facility] on September 06, 2019 which showed normal left ventricular function with ejection fraction 60-65%, normal right ventricular function.  I doubt significant cardiac disease given her recent extensive work-up.  However, will check troponin x2.  Because of question of neck injury, she is sent for CT of cervical spine.    Chest x-ray is unremarkable.  CT of C-spine is unremarkable.  Labs do show hypokalemia and she is given oral potassium.  She has a microcytic anemia which is unchanged from baseline.  Delta troponin is pending.  Case is signed out to Dr. Regenia Skeeter.  Final Clinical Impression(s) / ED Diagnoses Final diagnoses:  Vasovagal syncope  Atypical chest pain  Hypokalemia  Microcytic anemia  Contusion of neck, initial encounter    Rx / DC Orders ED Discharge Orders    None       Delora Fuel, MD 27/03/50 [phone]

## 2019-12-04 NOTE — ED Triage Notes (Signed)
Pt arrives with c/o syncope. LOC and hit head on door frame, c/o left neck pain. Hx of "cardiac issues that are undiagnosed" c/o CP x 3 days. No meds for the same. Pt stood up became dizzy, nauseated and passed out. SOB occurs with CP and heaviness in the left arm Pt rec'd 324 ASA, 0.4 nitro, 4 zofran and 500NS bolus. initial pressure 160, after nitro pressure 94 SBP. 20 Left AC established. CBG 119. ST 110 on the monitor.

## 2020-04-21 ENCOUNTER — Other Ambulatory Visit: Payer: Self-pay

## 2020-04-21 ENCOUNTER — Encounter (HOSPITAL_COMMUNITY): Payer: Self-pay

## 2020-04-21 ENCOUNTER — Emergency Department (HOSPITAL_COMMUNITY)
Admission: EM | Admit: 2020-04-21 | Discharge: 2020-04-22 | Disposition: A | Discharge status: Home / Self Care | Payer: Medicaid Other | Attending: Emergency Medicine | Admitting: Emergency Medicine

## 2020-04-21 DIAGNOSIS — F909 Attention-deficit hyperactivity disorder, unspecified type: Secondary | ICD-10-CM | POA: Diagnosis not present

## 2020-04-21 DIAGNOSIS — I10 Essential (primary) hypertension: Secondary | ICD-10-CM | POA: Diagnosis not present

## 2020-04-21 DIAGNOSIS — Z87891 Personal history of nicotine dependence: Secondary | ICD-10-CM | POA: Insufficient documentation

## 2020-04-21 DIAGNOSIS — Z79899 Other long term (current) drug therapy: Secondary | ICD-10-CM | POA: Diagnosis not present

## 2020-04-21 DIAGNOSIS — R1010 Upper abdominal pain, unspecified: Secondary | ICD-10-CM | POA: Insufficient documentation

## 2020-04-21 DIAGNOSIS — J45909 Unspecified asthma, uncomplicated: Secondary | ICD-10-CM | POA: Diagnosis not present

## 2020-04-21 MED ORDER — SODIUM CHLORIDE 0.9% FLUSH: 3.0000 mL | Freq: Once | INTRAVENOUS | Status: DC

## 2020-04-21 NOTE — ED Triage Notes (Signed)
Pt reports that she has been having abd pain for the past month, pt also having vaginal bleeding for the past month and brown discharge.

## 2020-04-22 LAB — LIPASE, BLOOD: Lipase: 28 U/L (ref 11–51)

## 2020-04-22 LAB — COMPREHENSIVE METABOLIC PANEL
ALT: 47 U/L — ABNORMAL HIGH (ref 0–44)
AST: 40 U/L (ref 15–41)
Albumin: 4 g/dL (ref 3.5–5.0)
Alkaline Phosphatase: 52 U/L (ref 38–126)
Anion gap: 10 (ref 5–15)
BUN: 7 mg/dL (ref 6–20)
CO2: 22 mmol/L (ref 22–32)
Calcium: 9.3 mg/dL (ref 8.9–10.3)
Chloride: 107 mmol/L (ref 98–111)
Creatinine, Ser: 0.73 mg/dL (ref 0.44–1.00)
GFR calc Af Amer: >60 mL/min (ref >60)
GFR calc non Af Amer: >60 mL/min (ref >60)
Glucose, Bld: 132 mg/dL — ABNORMAL HIGH (ref 70–99)
Potassium: 3.4 mmol/L — ABNORMAL LOW (ref 3.5–5.1)
Sodium: 139 mmol/L (ref 135–145)
Total Bilirubin: 0.6 mg/dL (ref 0.3–1.2)
Total Protein: 7.4 g/dL (ref 6.5–8.1)

## 2020-04-22 LAB — URINALYSIS, ROUTINE W REFLEX MICROSCOPIC
Bacteria, UA: NONE SEEN
Bilirubin Urine: NEGATIVE
Glucose, UA: NEGATIVE mg/dL
Ketones, ur: NEGATIVE mg/dL
Leukocytes,Ua: NEGATIVE
Nitrite: NEGATIVE
Protein, ur: NEGATIVE mg/dL
Specific Gravity, Urine: 1.015 (ref 1.005–1.030)
pH: 5 (ref 5.0–8.0)

## 2020-04-22 LAB — CBC
HCT: 38.7 % (ref 36.0–46.0)
Hemoglobin: 11.5 g/dL — ABNORMAL LOW (ref 12.0–15.0)
MCH: 19.1 pg — ABNORMAL LOW (ref 26.0–34.0)
MCHC: 29.7 g/dL — ABNORMAL LOW (ref 30.0–36.0)
MCV: 64.2 fL — ABNORMAL LOW (ref 80.0–100.0)
Platelets: 329 10*3/uL (ref 150–400)
RBC: 6.03 MIL/uL — ABNORMAL HIGH (ref 3.87–5.11)
RDW: 16.4 % — ABNORMAL HIGH (ref 11.5–15.5)
WBC: 10.2 10*3/uL (ref 4.0–10.5)
nRBC: 0 % (ref 0.0–0.2)

## 2020-04-22 LAB — I-STAT BETA HCG BLOOD, ED (MC, WL, AP ONLY): I-stat hCG, quantitative: <5 m[IU]/mL (ref <5)

## 2020-04-22 MED ORDER — SUCRALFATE 1 GM/10ML PO SUSP: 1.0000 g | Freq: Three times a day (TID) | ORAL | 0 refills | Status: AC

## 2020-04-22 MED ORDER — METOCLOPRAMIDE HCL 5 MG/ML IJ SOLN
10.0000 mg | Freq: Once | INTRAMUSCULAR | Status: AC
  Administered 2020-04-22: 10 mg via INTRAVENOUS
  Filled 2020-04-22: qty 2

## 2020-04-22 MED ORDER — HYOSCYAMINE SULFATE 0.125 MG SL SUBL
0.2500 mg | SUBLINGUAL_TABLET | Freq: Once | SUBLINGUAL | Status: AC
  Administered 2020-04-22: 0.25 mg via SUBLINGUAL
  Filled 2020-04-22: qty 2

## 2020-04-22 MED ORDER — LIDOCAINE VISCOUS HCL 2 % MT SOLN
15.0000 mL | Freq: Once | OROMUCOSAL | Status: AC
  Administered 2020-04-22: 15 mL via ORAL
  Filled 2020-04-22: qty 15

## 2020-04-22 MED ORDER — SUCRALFATE 1 G PO TABS
1.0000 g | ORAL_TABLET | Freq: Once | ORAL | Status: AC
  Administered 2020-04-22: 1 g via ORAL
  Filled 2020-04-22: qty 1

## 2020-04-22 MED ORDER — ONDANSETRON 4 MG PO TBDP: 4.0000 mg | ORAL_TABLET | Freq: Three times a day (TID) | ORAL | 0 refills | Status: AC | PRN

## 2020-04-22 MED ORDER — ALUM & MAG HYDROXIDE-SIMETH 200-200-20 MG/5ML PO SUSP
30.0000 mL | Freq: Once | ORAL | Status: AC
  Administered 2020-04-22: 30 mL via ORAL
  Filled 2020-04-22: qty 30

## 2020-04-22 NOTE — ED Provider Notes (Signed)
[facility] EMERGENCY DEPARTMENT Provider Note  CSN: [id number] Arrival date & time: 04/21/20 2335  Chief Complaint(s) Abdominal Pain  HPI Crystal Myers is a 39 y.o. female   The history is provided by the patient.    CC: abd cramping  Onset/Duration: 1 month Timing: intermittent, and fluctuating. Now constant for 2-3 days Location: periumbilical Quality: cramping Severity: moderate to severe Modifying Factors:  Improved by: nothing  Worsened by: eating,  Associated Signs/Symptoms:  Pertinent (+): bloating, vaginal bleeding, nausea/vomiting, chest discomfort, palpitations  Pertinent (-): fevers, chills, SOB, no change in bowel habits, dysuria Context: reports being under a lot of stress. No sick contacts, suspicious food intake, recent travel.  Got 2nd COVID vaccine 3 weeks ago.   Past Medical History Past Medical History:  Diagnosis Date  . ADHD   . Anemia   . Anxiety   . Asthma   . Chest pain   . Chlamydia   . Depression   . Headache   . Hypertension    Patient Active Problem List   Diagnosis Date Noted  . Essential hypertension 09/12/2019  . Acute coronary syndrome (HCC) 11/21/2018  . Chest pain 11/19/2018  . GERD (gastroesophageal reflux disease) 11/19/2018  . OSA (obstructive sleep apnea) 05/13/2018  . Dysphagia 05/13/2018  . Perennial and seasonal allergic rhinitis 07/08/2016  . Allergic conjunctivitis 07/08/2016  . Moderate persistent asthma 07/08/2016  . Generalized pruritus 07/08/2016  . Depression 12/24/2014  . Acute calculous cholecystitis s/p lap chole 12/24/2014 12/23/2014  . Migraine 02/11/2013   Home Medication(s) Prior to Admission medications   Medication Sig Start Date End Date Taking? Authorizing Provider  albuterol (PROAIR HFA) 108 (90 Base) MCG/ACT inhaler Inhale 2 puffs into the lungs every 4 (four) hours as needed for wheezing or shortness of breath. Patient not taking: Reported on 12/04/2019 08/19/18   Oretha Milch, MD  atorvastatin (LIPITOR) 40 MG tablet Take 1 tablet (40 mg total) by mouth daily at 6 PM. 07/19/19   Jefm Bryant, Jory Sims, PA-C  chlorthalidone (HYGROTON) 25 MG tablet Take 25 mg by mouth daily. 11/12/19   [provider]  EPINEPHrine (EPIPEN JR) 0.15 MG/0.3ML injection INJECT 0.3 MLS (0.15 MG TOTAL) INTO THE MUSCLE AS NEEDED FOR ANAPHYLAXIS. Patient not taking: Reported on 09/12/2019 12/16/18   Oretha Milch, MD  EPINEPHRINE 0.3 mg/0.3 mL IJ SOAJ injection INJECT 0.3 MG INTO THE MUSCLE AS NEEDED FOR ANAPHYLAXIS Patient not taking: No sig reported 03/08/19   Oretha Milch, MD  gemfibrozil (LOPID) 600 MG tablet Take 600 mg by mouth 2 (two) times daily. 10/11/19   [provider]  ipratropium-albuterol (DUONEB) 0.5-2.5 (3) MG/3ML SOLN Take 3 mLs by nebulization every 2 (two) hours as needed. 10/03/15   Barrett, Rolm Gala, PA-C  Levonorgestrel-Ethinyl Estradiol (CAMRESE) 0.15-0.03 &0.01 MG tablet Take 1 tablet by mouth daily. 05/14/18   Constant, Peggy, MD  metoprolol succinate (TOPROL-XL) 25 MG 24 hr tablet Take 1 tablet (25 mg total) by mouth daily. 07/19/19   Bethel Born, PA-C  mometasone-formoterol (DULERA) 200-5 MCG/ACT AERO Inhale 2 puffs into the lungs 2 (two) times daily. 08/19/18   Oretha Milch, MD  ondansetron (ZOFRAN ODT) 4 MG disintegrating tablet Take 1 tablet (4 mg total) by mouth every 8 (eight) hours as needed for up to 3 days for nausea or vomiting. 04/22/20 04/25/20  Lexie Koehl, Amadeo Garnet, MD  potassium chloride SA (KLOR-CON) 20 MEQ tablet Take 2 tablets (40 mEq total) by mouth 2 (two) times  daily for 5 days. 12/04/19 12/09/19  Pricilla Loveless, MD  sucralfate (CARAFATE) 1 GM/10ML suspension Take 10 mLs (1 g total) by mouth 4 (four) times daily -  with meals and at bedtime. 04/22/20   Nira Conn, MD  traZODone (DESYREL) 50 MG tablet Take 50 mg by mouth at bedtime as needed for sleep.    [provider]                                                                                                                                     Past Surgical History Past Surgical History:  Procedure Laterality Date  . ADENOIDECTOMY    . APPENDECTOMY    . CERVICAL BIOPSY  W/ LOOP ELECTRODE EXCISION    . CERVICAL POLYPECTOMY    . CESAREAN SECTION    . CESAREAN SECTION     x4  . CHOLECYSTECTOMY N/A 12/24/2014   Procedure: LAPAROSCOPIC CHOLECYSTECTOMY WITH INTRAOPERATIVE CHOLANGIOGRAM;  Surgeon: Glenna Fellows, MD;  Location: [facility] ORS;  Service: General;  Laterality: N/A;  . DILATION AND CURETTAGE OF UTERUS    . LEFT HEART CATH AND CORONARY ANGIOGRAPHY N/A 09/14/2019   Procedure: LEFT HEART CATH AND CORONARY ANGIOGRAPHY;  Surgeon: Orpah Cobb, MD;  Location: [facility] INVASIVE CV LAB;  Service: Cardiovascular;  Laterality: N/A;  . LYMPHADENECTOMY    . TONSILLECTOMY    . TUBAL LIGATION     Family History Family History  Problem Relation Age of Onset  . Diabetes Mother   . Hypertension Mother   . CAD Mother   . Allergic rhinitis Mother   . Asthma Sister     Social History Social History   Tobacco Use  . Smoking status: Former Smoker    Packs/day: 0.50    Types: Cigarettes    Quit date: 11/25/2014    Years since quitting: 5.4  . Smokeless tobacco: Never Used  Substance Use Topics  . Alcohol use: No    Alcohol/week: 0.0 standard drinks    Comment: former  . Drug use: No   Allergies Ferrous gluconate, Adhesive [tape], Penicillins, Chantix [varenicline], Dust mite mixed allergen ext [mite (d. farinae)], Other, Oxycodone, Amoxapine, and Augmentin [amoxicillin-pot clavulanate]  Review of Systems Review of Systems All other systems are reviewed and are negative for acute change except as noted in the HPI  Physical Exam Vital Signs  I have reviewed the triage vital signs BP 111/72   Pulse 94   Temp 98.7 F (37.1 C) (Oral)   Resp 18   Ht 5' 8.5" (1.74 m)   Wt 128 kg   SpO2 98%   BMI 42.28 kg/m   Physical Exam Vitals  reviewed.  Constitutional:      General: She is not in acute distress.    Appearance: She is well-developed. She is not diaphoretic.  HENT:     Head: Normocephalic and atraumatic.     Right Ear: External ear normal.  Left Ear: External ear normal.     Nose: Nose normal.  Eyes:     General: No scleral icterus.    Conjunctiva/sclera: Conjunctivae normal.  Neck:     Trachea: Phonation normal.  Cardiovascular:     Rate and Rhythm: Normal rate and regular rhythm.  Pulmonary:     Effort: Pulmonary effort is normal. No respiratory distress.     Breath sounds: No stridor.  Abdominal:     General: There is no distension.     Tenderness: There is abdominal tenderness in the epigastric area. There is no guarding or rebound.  Musculoskeletal:        General: Normal range of motion.     Cervical back: Normal range of motion.  Neurological:     Mental Status: She is alert and oriented to person, place, and time.  Psychiatric:        Behavior: Behavior normal.     ED Results and Treatments Labs (all labs ordered are listed, but only abnormal results are displayed) Labs Reviewed  COMPREHENSIVE METABOLIC PANEL - Abnormal; Notable for the following components:      Result Value   Potassium 3.4 (*)    Glucose, Bld 132 (*)    ALT 47 (*)    All other components within normal limits  CBC - Abnormal; Notable for the following components:   RBC 6.03 (*)    Hemoglobin 11.5 (*)    MCV 64.2 (*)    MCH 19.1 (*)    MCHC 29.7 (*)    RDW 16.4 (*)    All other components within normal limits  URINALYSIS, ROUTINE W REFLEX MICROSCOPIC - Abnormal; Notable for the following components:   Hgb urine dipstick MODERATE (*)    All other components within normal limits  LIPASE, BLOOD  I-STAT BETA HCG BLOOD, ED ([facility], [facility], [facility] ONLY)                                                                                                                         EKG  EKG Interpretation  Date/Time:    Ventricular  Rate:    PR Interval:    QRS Duration:   QT Interval:    QTC Calculation:   R Axis:     Text Interpretation:        Radiology No results found.  Pertinent labs & imaging results that were available during my care of the patient were reviewed by me and considered in my medical decision making (see chart for details).  Medications Ordered in ED Medications  sodium chloride flush (NS) 0.9 % injection 3 mL (has no administration in time range)  metoCLOPramide (REGLAN) injection 10 mg (has no administration in time range)  sucralfate (CARAFATE) tablet 1 g (has no administration in time range)  alum & mag hydroxide-simeth (MAALOX/MYLANTA) 200-200-20 MG/5ML suspension 30 mL (30 mLs Oral Given 04/22/20 0529)    And  lidocaine (XYLOCAINE) 2 % viscous mouth solution 15 mL (15 mLs  Oral Given 04/22/20 0528)  hyoscyamine (LEVSIN SL) SL tablet 0.25 mg (0.25 mg Sublingual Given 04/22/20 0528)                                                                                                                                    Procedures Procedures  (including critical care time)  Medical Decision Making / ED Course I have reviewed the nursing notes for this encounter and the patient's prior records (if available in EHR or on provided paperwork).   Crystal Myers was evaluated in Emergency Department on 04/22/2020 for the symptoms described in the history of present illness. She was evaluated in the context of the global COVID-19 pandemic, which necessitated consideration that the patient might be at risk for infection with the SARS-CoV-2 virus that causes COVID-19. Institutional protocols and algorithms that pertain to the evaluation of patients at risk for COVID-19 are in a state of rapid change based on information released by regulatory bodies including the CDC and federal and state organizations. These policies and algorithms were followed during the patient's care in the ED.  Upper abd TTP. Labs  grossly reassuring without a leukocytosis.  No significant electrolyte derangements or renal sufficiency.  No evidence of bili obstruction or pancreatitis. Patient reports status post cholecystectomy and appendectomy. UA with hematuria but without evidence of infection.  Patient does not have any lower abdominal tenderness to palpation concerning for pelvic process.  Low suspicion for serious intra-abdominal inflammatory/infectious process or small bowel obstruction.  Patient treated with GI cocktail which provided minimal relief. Tolerating oral intake. Given Reglan and Carafate. Discussed possibility of peptic ulcer disease.  Recommended PCP and gastroenterology follow-up.      Final Clinical Impression(s) / ED Diagnoses Final diagnoses:  Upper abdominal pain   The patient appears reasonably screened and/or stabilized for discharge and I doubt any other medical condition or other [facility] requiring further screening, evaluation, or treatment in the ED at this time prior to discharge. Safe for discharge with strict return precautions.  Disposition: Discharge  Condition: Good  I have discussed the results, Dx and Tx plan with the patient/family who expressed understanding and agree(s) with the plan. Discharge instructions discussed at length. The patient/family was given strict return precautions who verbalized understanding of the instructions. No further questions at time of discharge.    ED Discharge Orders         Ordered    sucralfate (CARAFATE) 1 GM/10ML suspension  3 times daily with meals & bedtime     04/22/20 0741    ondansetron (ZOFRAN ODT) 4 MG disintegrating tablet  Every 8 hours PRN     04/22/20 0743            Follow Up: Primary care provider  Schedule an appointment as soon as possible for a visit  If you do not have a primary care physician, contact [facility] at [phone] for referral  [facility]  GASTROENTEROLOGY  Schedule an appointment as soon as  possible for a visit        This chart was dictated using voice recognition software.  Despite best efforts to proofread,  errors can occur which can change the documentation meaning.   Nira Conn, MD 04/22/20 [phone]

## 2020-10-31 ENCOUNTER — Emergency Department (HOSPITAL_COMMUNITY)
Admission: EM | Admit: 2020-10-31 | Discharge: 2020-10-31 | Disposition: A | Discharge status: Home / Self Care | Payer: BLUE CROSS/BLUE SHIELD | Attending: Emergency Medicine | Admitting: Emergency Medicine

## 2020-10-31 ENCOUNTER — Emergency Department (HOSPITAL_COMMUNITY): Payer: BLUE CROSS/BLUE SHIELD

## 2020-10-31 ENCOUNTER — Other Ambulatory Visit: Payer: Self-pay

## 2020-10-31 ENCOUNTER — Encounter (HOSPITAL_COMMUNITY): Payer: Self-pay

## 2020-10-31 DIAGNOSIS — K219 Gastro-esophageal reflux disease without esophagitis: Secondary | ICD-10-CM | POA: Insufficient documentation

## 2020-10-31 DIAGNOSIS — Z7951 Long term (current) use of inhaled steroids: Secondary | ICD-10-CM | POA: Insufficient documentation

## 2020-10-31 DIAGNOSIS — J45909 Unspecified asthma, uncomplicated: Secondary | ICD-10-CM | POA: Diagnosis not present

## 2020-10-31 DIAGNOSIS — Z79899 Other long term (current) drug therapy: Secondary | ICD-10-CM | POA: Diagnosis not present

## 2020-10-31 DIAGNOSIS — I10 Essential (primary) hypertension: Secondary | ICD-10-CM | POA: Diagnosis not present

## 2020-10-31 DIAGNOSIS — K529 Noninfective gastroenteritis and colitis, unspecified: Secondary | ICD-10-CM | POA: Diagnosis not present

## 2020-10-31 DIAGNOSIS — Z87891 Personal history of nicotine dependence: Secondary | ICD-10-CM | POA: Insufficient documentation

## 2020-10-31 DIAGNOSIS — R1013 Epigastric pain: Secondary | ICD-10-CM | POA: Diagnosis present

## 2020-10-31 LAB — COMPREHENSIVE METABOLIC PANEL
ALT: 21 U/L (ref 0–44)
AST: 23 U/L (ref 15–41)
Albumin: 4 g/dL (ref 3.5–5.0)
Alkaline Phosphatase: 73 U/L (ref 38–126)
Anion gap: 13 (ref 5–15)
BUN: 8 mg/dL (ref 6–20)
CO2: 20 mmol/L — ABNORMAL LOW (ref 22–32)
Calcium: 9 mg/dL (ref 8.9–10.3)
Chloride: 104 mmol/L (ref 98–111)
Creatinine, Ser: 0.69 mg/dL (ref 0.44–1.00)
GFR, Estimated: >60 mL/min (ref >60)
Glucose, Bld: 112 mg/dL — ABNORMAL HIGH (ref 70–99)
Potassium: 3.9 mmol/L (ref 3.5–5.1)
Sodium: 137 mmol/L (ref 135–145)
Total Bilirubin: 0.8 mg/dL (ref 0.3–1.2)
Total Protein: 7.6 g/dL (ref 6.5–8.1)

## 2020-10-31 LAB — CBC
HCT: 41.4 % (ref 36.0–46.0)
Hemoglobin: 12.8 g/dL (ref 12.0–15.0)
MCH: 19.5 pg — ABNORMAL LOW (ref 26.0–34.0)
MCHC: 30.9 g/dL (ref 30.0–36.0)
MCV: 62.9 fL — ABNORMAL LOW (ref 80.0–100.0)
Platelets: 346 10*3/uL (ref 150–400)
RBC: 6.58 MIL/uL — ABNORMAL HIGH (ref 3.87–5.11)
RDW: 17.8 % — ABNORMAL HIGH (ref 11.5–15.5)
WBC: 17.2 10*3/uL — ABNORMAL HIGH (ref 4.0–10.5)
nRBC: 0 % (ref 0.0–0.2)

## 2020-10-31 LAB — URINALYSIS, ROUTINE W REFLEX MICROSCOPIC
Bilirubin Urine: NEGATIVE
Glucose, UA: NEGATIVE mg/dL
Hgb urine dipstick: NEGATIVE
Ketones, ur: 20 mg/dL — AB
Leukocytes,Ua: NEGATIVE
Nitrite: NEGATIVE
Protein, ur: NEGATIVE mg/dL
Specific Gravity, Urine: 1.023 (ref 1.005–1.030)
pH: 5 (ref 5.0–8.0)

## 2020-10-31 LAB — I-STAT BETA HCG BLOOD, ED (MC, WL, AP ONLY): I-stat hCG, quantitative: <5 m[IU]/mL (ref <5)

## 2020-10-31 LAB — LIPASE, BLOOD: Lipase: 27 U/L (ref 11–51)

## 2020-10-31 MED ORDER — METRONIDAZOLE 500 MG PO TABS
500.0000 mg | ORAL_TABLET | Freq: Once | ORAL | Status: AC
  Administered 2020-10-31: 500 mg via ORAL
  Filled 2020-10-31: qty 1

## 2020-10-31 MED ORDER — IOHEXOL 300 MG/ML  SOLN
100.0000 mL | Freq: Once | INTRAMUSCULAR | Status: AC | PRN
  Administered 2020-10-31: 100 mL via INTRAVENOUS

## 2020-10-31 MED ORDER — METRONIDAZOLE 500 MG PO TABS: 500.0000 mg | ORAL_TABLET | Freq: Two times a day (BID) | ORAL | 0 refills | Status: DC

## 2020-10-31 MED ORDER — SODIUM CHLORIDE 0.9 % IV BOLUS
1000.0000 mL | Freq: Once | INTRAVENOUS | Status: AC
  Administered 2020-10-31: 1000 mL via INTRAVENOUS

## 2020-10-31 MED ORDER — FENTANYL CITRATE (PF) 100 MCG/2ML IJ SOLN
50.0000 ug | Freq: Once | INTRAMUSCULAR | Status: AC
  Administered 2020-10-31: 50 ug via INTRAVENOUS
  Filled 2020-10-31: qty 2

## 2020-10-31 MED ORDER — HYDROCODONE-ACETAMINOPHEN 5-325 MG PO TABS
1.0000 | ORAL_TABLET | Freq: Four times a day (QID) | ORAL | 0 refills | Status: AC | PRN
Start: 2020-10-31 — End: ?

## 2020-10-31 MED ORDER — ONDANSETRON 4 MG PO TBDP
4.0000 mg | ORAL_TABLET | Freq: Once | ORAL | Status: AC | PRN
  Administered 2020-10-31: 4 mg via ORAL
  Filled 2020-10-31: qty 1

## 2020-10-31 MED ORDER — HYDROMORPHONE HCL 1 MG/ML IJ SOLN
1.0000 mg | Freq: Once | INTRAMUSCULAR | Status: AC
  Administered 2020-10-31: 1 mg via INTRAVENOUS
  Filled 2020-10-31: qty 1

## 2020-10-31 MED ORDER — KETOROLAC TROMETHAMINE 30 MG/ML IJ SOLN
30.0000 mg | Freq: Once | INTRAMUSCULAR | Status: AC
  Administered 2020-10-31: 30 mg via INTRAVENOUS
  Filled 2020-10-31: qty 1

## 2020-10-31 MED ORDER — ONDANSETRON 4 MG PO TBDP: ORAL_TABLET | ORAL | 0 refills | Status: AC

## 2020-10-31 MED ORDER — CIPROFLOXACIN HCL 500 MG PO TABS
500.0000 mg | ORAL_TABLET | Freq: Once | ORAL | Status: AC
  Administered 2020-10-31: 500 mg via ORAL
  Filled 2020-10-31: qty 1

## 2020-10-31 MED ORDER — CIPROFLOXACIN HCL 500 MG PO TABS: 500.0000 mg | ORAL_TABLET | Freq: Two times a day (BID) | ORAL | 0 refills | Status: DC

## 2020-10-31 NOTE — ED Triage Notes (Signed)
Patient complains of sharp epigastric pain since midmorning. vomited x 2 with loose stools. Reports that she was diagnosed with IBS and has endo scheduled in january

## 2020-10-31 NOTE — ED Provider Notes (Signed)
[facility] EMERGENCY DEPARTMENT Provider Note   CSN: [id number] Arrival date & time: 10/31/20  1343     History Chief Complaint  Patient presents with  . Abdominal Pain    Crystal Myers is a 39 y.o. female history of asthma, hypertension, here presenting with abdominal pain.  Patient states that she has a history of IBS.  She states that she had endoscopy previously scheduled for another endoscopy with her GI doctor.  She states that she has worsening abdominal pain today.  She states that it is sharp and is epigastric and radiates to the left flank area.  Patient also had vomiting associated with it and some loose stools.  Denies any fevers or chills  The history is provided by the patient.       Past Medical History:  Diagnosis Date  . ADHD   . Anemia   . Anxiety   . Asthma   . Chest pain   . Chlamydia   . Depression   . Headache   . Hypertension     Patient Active Problem List   Diagnosis Date Noted  . Essential hypertension 09/12/2019  . Acute coronary syndrome (HCC) 11/21/2018  . Chest pain 11/19/2018  . GERD (gastroesophageal reflux disease) 11/19/2018  . OSA (obstructive sleep apnea) 05/13/2018  . Dysphagia 05/13/2018  . Perennial and seasonal allergic rhinitis 07/08/2016  . Allergic conjunctivitis 07/08/2016  . Moderate persistent asthma 07/08/2016  . Generalized pruritus 07/08/2016  . Depression 12/24/2014  . Acute calculous cholecystitis s/p lap chole 12/24/2014 12/23/2014  . Migraine 02/11/2013    Past Surgical History:  Procedure Laterality Date  . ADENOIDECTOMY    . APPENDECTOMY    . CERVICAL BIOPSY  W/ LOOP ELECTRODE EXCISION    . CERVICAL POLYPECTOMY    . CESAREAN SECTION    . CESAREAN SECTION     x4  . CHOLECYSTECTOMY N/A 12/24/2014   Procedure: LAPAROSCOPIC CHOLECYSTECTOMY WITH INTRAOPERATIVE CHOLANGIOGRAM;  Surgeon: Glenna Fellows, MD;  Location: [facility];  Service: General;  Laterality: N/A;  . DILATION AND  CURETTAGE OF UTERUS    . LEFT HEART CATH AND CORONARY ANGIOGRAPHY N/A 09/14/2019   Procedure: LEFT HEART CATH AND CORONARY ANGIOGRAPHY;  Surgeon: Orpah Cobb, MD;  Location: [facility] INVASIVE CV LAB;  Service: Cardiovascular;  Laterality: N/A;  . LYMPHADENECTOMY    . TONSILLECTOMY    . TUBAL LIGATION       OB History    Gravida  5   Para  5   Term  3   Preterm  2   AB      Living  3     SAB      TAB      Ectopic      Multiple      Live Births  4           Family History  Problem Relation Age of Onset  . Diabetes Mother   . Hypertension Mother   . CAD Mother   . Allergic rhinitis Mother   . Asthma Sister     Social History   Tobacco Use  . Smoking status: Former Smoker    Packs/day: 0.50    Types: Cigarettes    Quit date: 11/25/2014    Years since quitting: 5.9  . Smokeless tobacco: Never Used  Vaping Use  . Vaping Use: Never used  Substance Use Topics  . Alcohol use: No    Alcohol/week: 0.0 standard drinks  Comment: former  . Drug use: No    Home Medications Prior to Admission medications   Medication Sig Start Date End Date Taking? Authorizing Provider  albuterol (PROAIR HFA) 108 (90 Base) MCG/ACT inhaler Inhale 2 puffs into the lungs every 4 (four) hours as needed for wheezing or shortness of breath. Patient not taking: Reported on 12/04/2019 08/19/18   Oretha MilchAlva, Rakesh V, MD  atorvastatin (LIPITOR) 40 MG tablet Take 1 tablet (40 mg total) by mouth daily at 6 PM. 07/19/19   Jefm BryantGekas, Jory SimsKelly Marie, PA-C  chlorthalidone (HYGROTON) 25 MG tablet Take 25 mg by mouth daily. 11/12/19   [provider]  EPINEPHrine (EPIPEN JR) 0.15 MG/0.3ML injection INJECT 0.3 MLS (0.15 MG TOTAL) INTO THE MUSCLE AS NEEDED FOR ANAPHYLAXIS. Patient not taking: Reported on 09/12/2019 12/16/18   Oretha MilchAlva, Rakesh V, MD  EPINEPHRINE 0.3 mg/0.3 mL IJ SOAJ injection INJECT 0.3 MG INTO THE MUSCLE AS NEEDED FOR ANAPHYLAXIS Patient not taking: No sig reported 03/08/19   Oretha MilchAlva, Rakesh V, MD   gemfibrozil (LOPID) 600 MG tablet Take 600 mg by mouth 2 (two) times daily. 10/11/19   [provider]  ipratropium-albuterol (DUONEB) 0.5-2.5 (3) MG/3ML SOLN Take 3 mLs by nebulization every 2 (two) hours as needed. 10/03/15   Barrett, Rolm GalaStevi, PA-C  Levonorgestrel-Ethinyl Estradiol (CAMRESE) 0.15-0.03 &0.01 MG tablet Take 1 tablet by mouth daily. 05/14/18   Constant, Peggy, MD  metoprolol succinate (TOPROL-XL) 25 MG 24 hr tablet Take 1 tablet (25 mg total) by mouth daily. 07/19/19   Bethel BornGekas, Kelly Marie, PA-C  mometasone-formoterol (DULERA) 200-5 MCG/ACT AERO Inhale 2 puffs into the lungs 2 (two) times daily. 08/19/18   Oretha MilchAlva, Rakesh V, MD  potassium chloride SA (KLOR-CON) 20 MEQ tablet Take 2 tablets (40 mEq total) by mouth 2 (two) times daily for 5 days. 12/04/19 12/09/19  Pricilla LovelessGoldston, Scott, MD  sucralfate (CARAFATE) 1 GM/10ML suspension Take 10 mLs (1 g total) by mouth 4 (four) times daily -  with meals and at bedtime. 04/22/20   Nira Connardama, Pedro Eduardo, MD  traZODone (DESYREL) 50 MG tablet Take 50 mg by mouth at bedtime as needed for sleep.    [provider]    Allergies    Ferrous gluconate, Adhesive [tape], Penicillins, Chantix [varenicline], Dust mite mixed allergen ext [mite (d. farinae)], Other, Oxycodone, Amoxapine, and Augmentin [amoxicillin-pot clavulanate]  Review of Systems   Review of Systems  Gastrointestinal: Positive for abdominal pain.  All other systems reviewed and are negative.   Physical Exam Updated Vital Signs BP (!) 133/96   Pulse 92   Temp 98 F (36.7 C) (Oral)   Resp 18   SpO2 96%   Physical Exam Vitals and nursing note reviewed.  Constitutional:      Comments: Uncomfortable, writhing in pain   HENT:     Head: Normocephalic.     Mouth/Throat:     Mouth: Mucous membranes are moist.  Eyes:     Extraocular Movements: Extraocular movements intact.     Pupils: Pupils are equal, round, and reactive to light.  Cardiovascular:     Rate and Rhythm:  Normal rate and regular rhythm.     Heart sounds: Normal heart sounds.  Pulmonary:     Effort: Pulmonary effort is normal.     Breath sounds: Normal breath sounds.  Abdominal:     General: Bowel sounds are normal.     Palpations: Abdomen is soft.     Comments: + epigastric and L CVAT   Skin:  General: Skin is warm.     Capillary Refill: Capillary refill takes less than 2 seconds.  Neurological:     General: No focal deficit present.     Mental Status: She is oriented to person, place, and time.  Psychiatric:        Mood and Affect: Mood normal.        Behavior: Behavior normal.     ED Results / Procedures / Treatments   Labs (all labs ordered are listed, but only abnormal results are displayed) Labs Reviewed  COMPREHENSIVE METABOLIC PANEL - Abnormal; Notable for the following components:      Result Value   CO2 20 (*)    Glucose, Bld 112 (*)    All other components within normal limits  CBC - Abnormal; Notable for the following components:   WBC 17.2 (*)    RBC 6.58 (*)    MCV 62.9 (*)    MCH 19.5 (*)    RDW 17.8 (*)    All other components within normal limits  URINALYSIS, ROUTINE W REFLEX MICROSCOPIC - Abnormal; Notable for the following components:   APPearance HAZY (*)    Ketones, ur 20 (*)    All other components within normal limits  LIPASE, BLOOD  I-STAT BETA HCG BLOOD, ED ([facility], [facility], [facility] ONLY)    EKG None  Radiology CT ABDOMEN PELVIS W CONTRAST  Result Date: 10/31/2020 CLINICAL DATA:  Abdominal pain EXAM: CT ABDOMEN AND PELVIS WITH CONTRAST TECHNIQUE: Multidetector CT imaging of the abdomen and pelvis was performed using the standard protocol following bolus administration of intravenous contrast. CONTRAST:  OMNIPAQUE IOHEXOL 300 MG/ML  SOLN COMPARISON:  May 22, 2019 FINDINGS: Lower chest: The visualized heart size within normal limits. No pericardial fluid/thickening. No hiatal hernia. The visualized portions of the lungs are clear. Hepatobiliary: The  liver is normal in density without focal abnormality.The main portal vein is patent. The patient is status post cholecystectomy. No biliary ductal dilation. Pancreas: Unremarkable. No pancreatic ductal dilatation or surrounding inflammatory changes. Spleen: Normal in size without focal abnormality. Adrenals/Urinary Tract: Both adrenal glands appear normal. The kidneys and collecting system appear normal without evidence of urinary tract calculus or hydronephrosis. Bladder is unremarkable. Stomach/Bowel: The stomach is normal. There are multiple loops distal ileum with mild wall thickening and surrounding mild fat stranding changes. There is a moderate amount of colonic stool present. No pericolonic loculated fluid collection or free air is seen. Vascular/Lymphatic: There are no enlarged mesenteric, retroperitoneal, or pelvic lymph nodes. No significant vascular findings are present. Reproductive: The uterus and adnexa are unremarkable. Other: No evidence of abdominal wall mass or hernia. There is small amount of free fluid seen within the deep pelvis. Musculoskeletal: No acute or significant osseous findings. IMPRESSION: Mild wall thickening and inflammatory changes within several distal ileal loops in the deep pelvis, likely due to enteritis. Amount pelvic ascites. Electronically Signed   By: Jonna Clark M.D.   On: 10/31/2020 21:18    Procedures Procedures (including critical care time)  Medications Ordered in ED Medications  HYDROmorphone (DILAUDID) injection 1 mg (has no administration in time range)  ciprofloxacin (CIPRO) tablet 500 mg (has no administration in time range)  metroNIDAZOLE (FLAGYL) tablet 500 mg (has no administration in time range)  ondansetron (ZOFRAN-ODT) disintegrating tablet 4 mg (4 mg Oral Given 10/31/20 1446)  sodium chloride 0.9 % bolus 1,000 mL (1,000 mLs Intravenous New Bag/Given 10/31/20 2033)  ketorolac (TORADOL) 30 MG/ML injection 30 mg (30 mg Intravenous Given  10/31/20  2033)  fentaNYL (SUBLIMAZE) injection 50 mcg (50 mcg Intravenous Given 10/31/20 2032)  iohexol (OMNIPAQUE) 300 MG/ML solution 100 mL (100 mLs Intravenous Contrast Given 10/31/20 2049)    ED Course  I have reviewed the triage vital signs and the nursing notes.  Pertinent labs & imaging results that were available during my care of the patient were reviewed by me and considered in my medical decision making (see chart for details).    MDM Rules/Calculators/A&P                         Cherre M Sharma Covert Alita Myers is a 39 y.o. female here presenting with abdominal pain.  She has a history of IBS.  I reviewed her previous CT and there appears to be lesions consistent with IBD.  Concern for strictures versus SBO versus renal colic.  Plan to get CBC, CMP, lipase, CT abdomen pelvis, urinalysis.  9:45 PM Labs showed white blood cell count of 17. CT showed some enteritis.  I reviewed records from her GI doctor at [facility].  Patient apparently had a unremarkable colonoscopy previously .  She is scheduled for endoscopy.  Given her previous CT showed possible IBD but she is not diagnosed with Crohn's or ulcerative colitis.  I wonder if this enteritis is unrelated.  Since she has a leukocytosis we will discharge home with Cipro and Flagyl.  We will have her follow-up with GI and she has endoscopy scheduled in a month.   Final Clinical Impression(s) / ED Diagnoses Final diagnoses:  None    Rx / DC Orders ED Discharge Orders    None       Charlynne Pander, MD 10/31/20 2147

## 2020-10-31 NOTE — Discharge Instructions (Signed)
Take zofran for nausea   Take cipro and flagyl as prescribed   Take vicodin for pain   See your GI doctor for follow up   Return to ER if you have worse abdominal pain, vomiting, fever

## 2021-01-12 IMAGING — CR LUMBAR SPINE - 2-3 VIEW
3 series · 3 of 3 positions shown · non-contrast
Comparison: Radiograph May 05, 2015.

CLINICAL DATA: Fall on wet floor.  Lower back pain

EXAM:
LUMBAR SPINE - 2-3 VIEW

[l-spine ap]
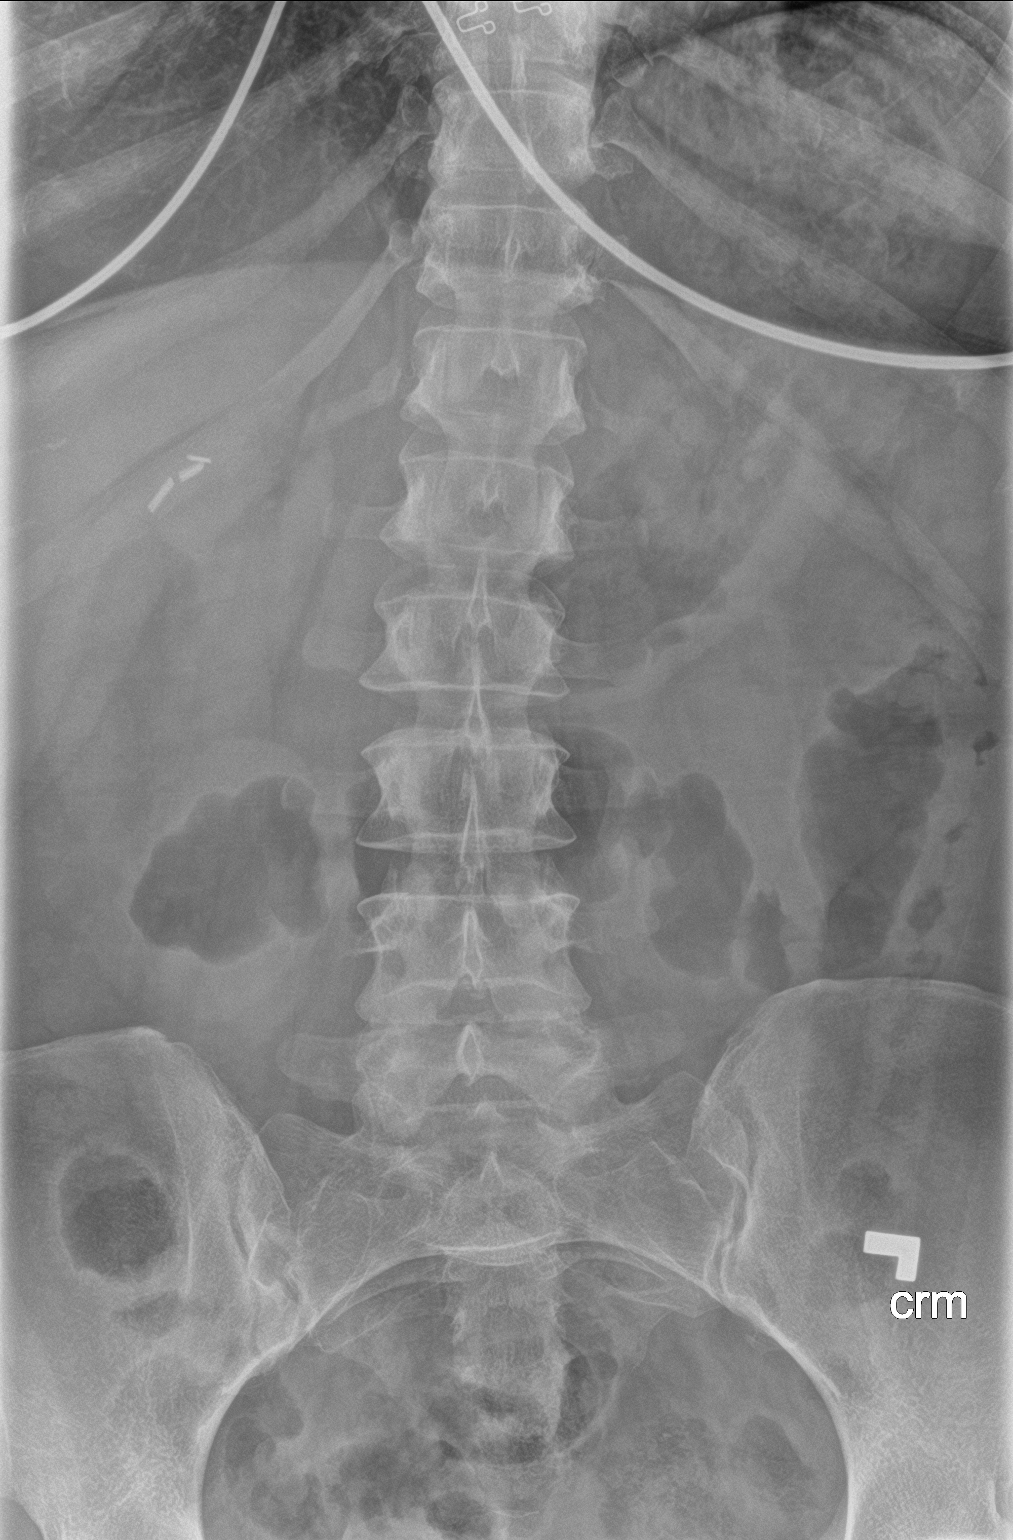

[l-spine lat]
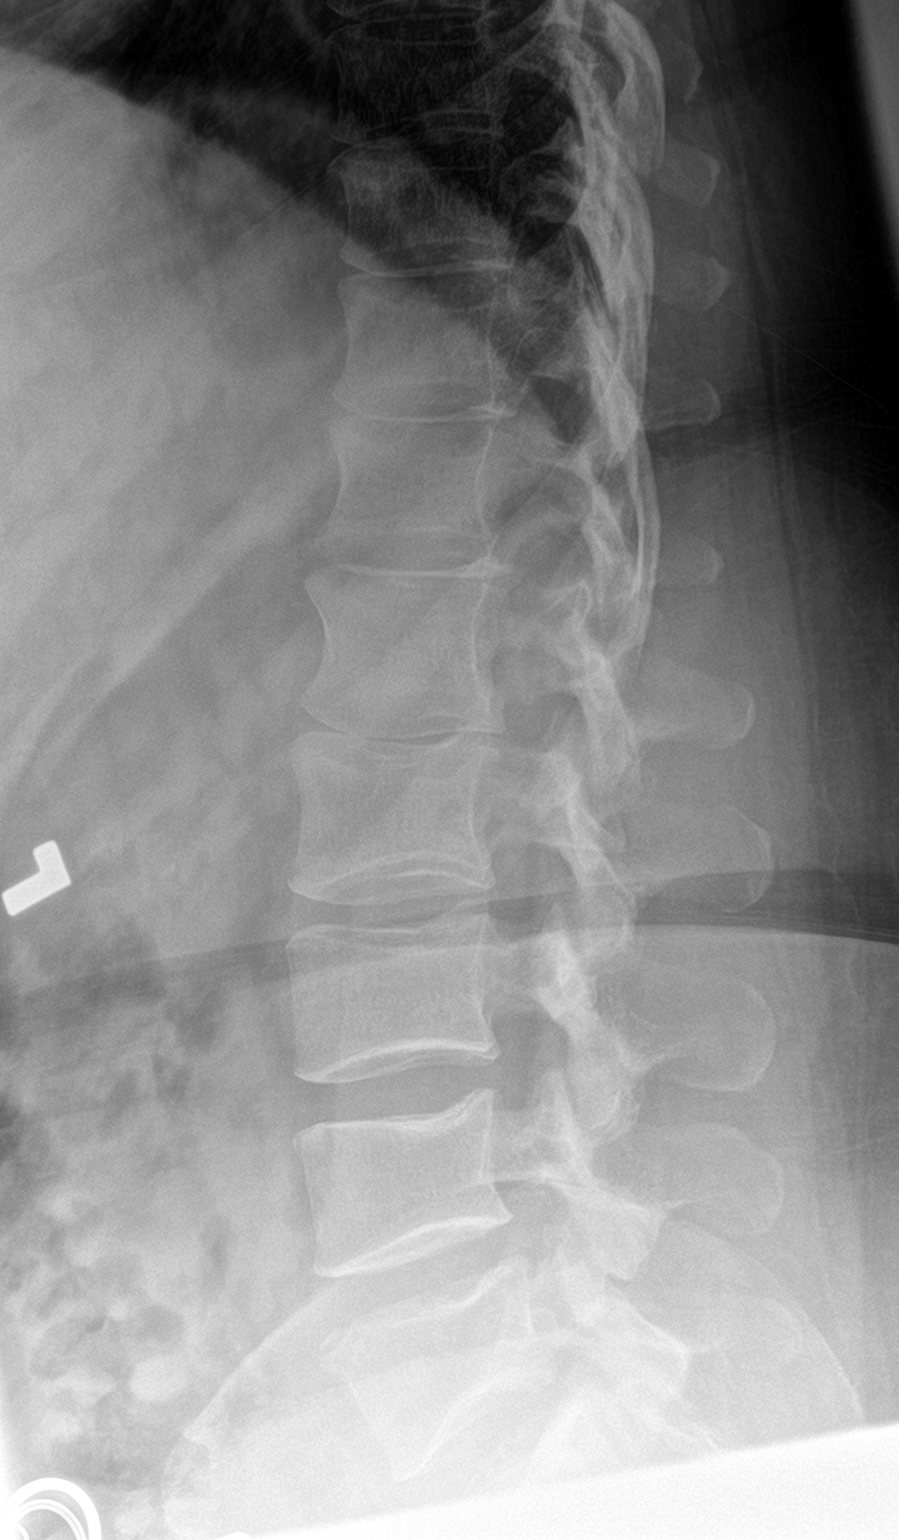

[l-spine spot]
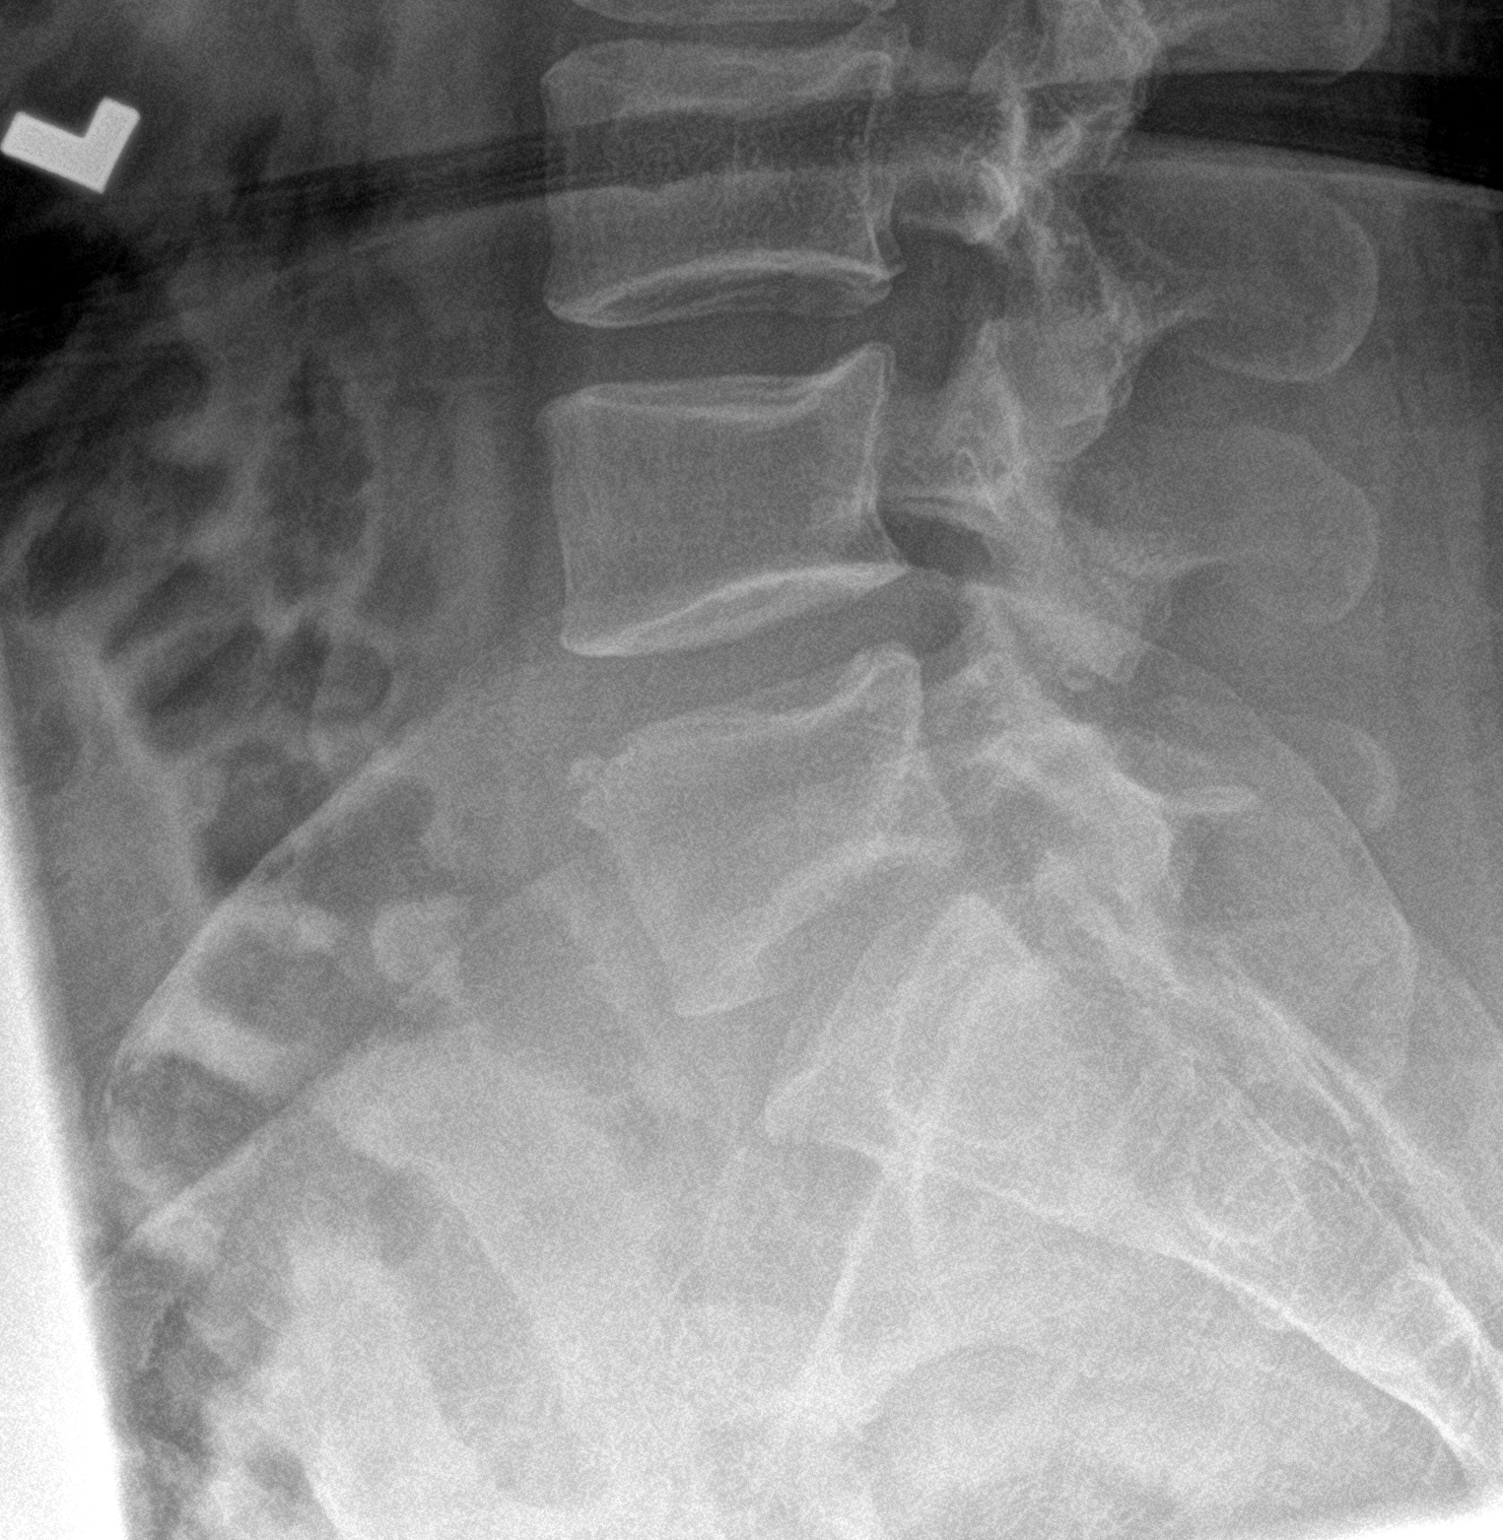

[3 of 3 positions shown; findings below may reference images not displayed]

FINDINGS: Rudimentary ribs at T12. 5 non-rib-bearing lumbar type vertebral
bodies. No acute fracture or vertebral body height loss. Maintained
intervertebral disc heights. Minimal facet changes at L5-S1.
Cholecystectomy clips in the right upper quadrant. Remaining soft
tissues are unremarkable.
IMPRESSION: No acute fracture, vertebral body height loss or traumatic
listhesis.

Minimal facet degenerative changes at L5-S1.

## 2021-10-08 ENCOUNTER — Emergency Department (HOSPITAL_BASED_OUTPATIENT_CLINIC_OR_DEPARTMENT_OTHER)
Admission: EM | Admit: 2021-10-08 | Discharge: 2021-10-08 | Disposition: A | Discharge status: Home / Self Care | Payer: BLUE CROSS/BLUE SHIELD | Attending: Emergency Medicine | Admitting: Emergency Medicine

## 2021-10-08 ENCOUNTER — Encounter (HOSPITAL_BASED_OUTPATIENT_CLINIC_OR_DEPARTMENT_OTHER): Payer: Self-pay | Admitting: Obstetrics and Gynecology

## 2021-10-08 ENCOUNTER — Other Ambulatory Visit: Payer: Self-pay

## 2021-10-08 DIAGNOSIS — Z87891 Personal history of nicotine dependence: Secondary | ICD-10-CM | POA: Diagnosis not present

## 2021-10-08 DIAGNOSIS — J45909 Unspecified asthma, uncomplicated: Secondary | ICD-10-CM | POA: Insufficient documentation

## 2021-10-08 DIAGNOSIS — L509 Urticaria, unspecified: Secondary | ICD-10-CM | POA: Insufficient documentation

## 2021-10-08 DIAGNOSIS — Z79899 Other long term (current) drug therapy: Secondary | ICD-10-CM | POA: Insufficient documentation

## 2021-10-08 DIAGNOSIS — R21 Rash and other nonspecific skin eruption: Secondary | ICD-10-CM | POA: Diagnosis present

## 2021-10-08 DIAGNOSIS — I10 Essential (primary) hypertension: Secondary | ICD-10-CM | POA: Insufficient documentation

## 2021-10-08 LAB — COMPREHENSIVE METABOLIC PANEL
ALT: 19 U/L (ref 0–44)
AST: 21 U/L (ref 15–41)
Albumin: 4.5 g/dL (ref 3.5–5.0)
Alkaline Phosphatase: 55 U/L (ref 38–126)
Anion gap: 11 (ref 5–15)
BUN: 12 mg/dL (ref 6–20)
CO2: 23 mmol/L (ref 22–32)
Calcium: 9.3 mg/dL (ref 8.9–10.3)
Chloride: 104 mmol/L (ref 98–111)
Creatinine, Ser: 0.73 mg/dL (ref 0.44–1.00)
GFR, Estimated: >60 mL/min (ref >60)
Glucose, Bld: 143 mg/dL — ABNORMAL HIGH (ref 70–99)
Potassium: 3.8 mmol/L (ref 3.5–5.1)
Sodium: 138 mmol/L (ref 135–145)
Total Bilirubin: 0.7 mg/dL (ref 0.3–1.2)
Total Protein: 7.5 g/dL (ref 6.5–8.1)

## 2021-10-08 MED ORDER — FAMOTIDINE 20 MG PO TABS
20.0000 mg | ORAL_TABLET | Freq: Once | ORAL | Status: AC
  Administered 2021-10-08: 20 mg via ORAL
  Filled 2021-10-08: qty 1

## 2021-10-08 MED ORDER — DIPHENHYDRAMINE HCL 50 MG/ML IJ SOLN: 25.0000 mg | Freq: Once | INTRAMUSCULAR | Status: DC

## 2021-10-08 MED ORDER — DIPHENHYDRAMINE HCL 50 MG/ML IJ SOLN
25.0000 mg | Freq: Once | INTRAMUSCULAR | Status: AC
  Administered 2021-10-08: 25 mg via INTRAVENOUS
  Filled 2021-10-08: qty 1

## 2021-10-08 MED ORDER — LORAZEPAM 2 MG/ML IJ SOLN
0.5000 mg | Freq: Once | INTRAMUSCULAR | Status: AC
  Administered 2021-10-08: 0.5 mg via INTRAVENOUS
  Filled 2021-10-08: qty 1

## 2021-10-08 MED ORDER — PREDNISONE 20 MG PO TABS: 40.0000 mg | ORAL_TABLET | Freq: Every day | ORAL | 0 refills | Status: AC

## 2021-10-08 MED ORDER — METHYLPREDNISOLONE SODIUM SUCC 125 MG IJ SOLR
125.0000 mg | Freq: Once | INTRAMUSCULAR | Status: AC
  Administered 2021-10-08: 125 mg via INTRAVENOUS
  Filled 2021-10-08: qty 2

## 2021-10-08 MED ORDER — HYDROXYZINE HCL 25 MG PO TABS: 25.0000 mg | ORAL_TABLET | Freq: Four times a day (QID) | ORAL | 0 refills | Status: AC

## 2021-10-08 NOTE — ED Triage Notes (Signed)
Patient reports to the ER for hives on her arms and face after taking omeprazole. Patient reports she feels more itchy than she sees the bumps.

## 2021-10-08 NOTE — ED Provider Notes (Signed)
[facility] EMERGENCY DEPT Provider Note   CSN: [id number] Arrival date & time: 10/08/21  1958     History Chief Complaint  Patient presents with   Urticaria    Naleyah M Sharma Covert Alita Chyle is a 40 y.o. female.  HPI 40 yo presents to the ed for eval of urticaria an pruritis. She first noticed the symptoms this morning. She reports she started back taking prilosec for the past week. Pt reports taking this in the past. No new detergents, lotions, or soaps. Pt denies any sob, throat swelling, fevers, chills. She took benadryl for symptoms 1 hours ago without relief.     Past Medical History:  Diagnosis Date   ADHD    Anemia    Anxiety    Asthma    Chest pain    Chlamydia    Depression    Headache    Hypertension     Patient Active Problem List   Diagnosis Date Noted   Essential hypertension 09/12/2019   Acute coronary syndrome (HCC) 11/21/2018   Chest pain 11/19/2018   GERD (gastroesophageal reflux disease) 11/19/2018   OSA (obstructive sleep apnea) 05/13/2018   Dysphagia 05/13/2018   Perennial and seasonal allergic rhinitis 07/08/2016   Allergic conjunctivitis 07/08/2016   Moderate persistent asthma 07/08/2016   Generalized pruritus 07/08/2016   Depression 12/24/2014   Acute calculous cholecystitis s/p lap chole 12/24/2014 12/23/2014   Migraine 02/11/2013    Past Surgical History:  Procedure Laterality Date   ADENOIDECTOMY     APPENDECTOMY     CERVICAL BIOPSY  W/ LOOP ELECTRODE EXCISION     CERVICAL POLYPECTOMY     CESAREAN SECTION     CESAREAN SECTION     x4   CHOLECYSTECTOMY N/A 12/24/2014   Procedure: LAPAROSCOPIC CHOLECYSTECTOMY WITH INTRAOPERATIVE CHOLANGIOGRAM;  Surgeon: Glenna Fellows, MD;  Location: [facility];  Service: General;  Laterality: N/A;   DILATION AND CURETTAGE OF UTERUS     LEFT HEART CATH AND CORONARY ANGIOGRAPHY N/A 09/14/2019   Procedure: LEFT HEART CATH AND CORONARY ANGIOGRAPHY;  Surgeon: Orpah Cobb, MD;  Location: [facility]  INVASIVE CV LAB;  Service: Cardiovascular;  Laterality: N/A;   LYMPHADENECTOMY     TONSILLECTOMY     TUBAL LIGATION       OB History     Gravida  5   Para  5   Term  3   Preterm  2   AB      Living  3      SAB      IAB      Ectopic      Multiple      Live Births  4           Family History  Problem Relation Age of Onset   Diabetes Mother    Hypertension Mother    CAD Mother    Allergic rhinitis Mother    Asthma Sister     Social History   Tobacco Use   Smoking status: Former    Packs/day: 0.50    Types: Cigarettes    Quit date: 11/25/2014    Years since quitting: 6.8   Smokeless tobacco: Never  Vaping Use   Vaping Use: Never used  Substance Use Topics   Alcohol use: No    Alcohol/week: 0.0 standard drinks    Comment: former   Drug use: No    Home Medications Prior to Admission medications   Medication Sig Start Date End Date Taking? Authorizing Provider  hydrOXYzine (ATARAX/VISTARIL)  25 MG tablet Take 1 tablet (25 mg total) by mouth every 6 (six) hours. 10/08/21  Yes Gryffin Altice, Lynann Beaver, PA-C  predniSONE (DELTASONE) 20 MG tablet Take 2 tablets (40 mg total) by mouth daily for 4 days. 10/09/21 10/13/21 Yes Wael Maestas, Lynann Beaver, PA-C  albuterol (PROAIR HFA) 108 (90 Base) MCG/ACT inhaler Inhale 2 puffs into the lungs every 4 (four) hours as needed for wheezing or shortness of breath. Patient not taking: Reported on 12/04/2019 08/19/18   Oretha Milch, MD  atorvastatin (LIPITOR) 40 MG tablet Take 1 tablet (40 mg total) by mouth daily at 6 PM. 07/19/19   Jefm Bryant, Jory Sims, PA-C  chlorthalidone (HYGROTON) 25 MG tablet Take 25 mg by mouth daily. 11/12/19   [provider]  ciprofloxacin (CIPRO) 500 MG tablet Take 1 tablet (500 mg total) by mouth 2 (two) times daily. One po bid x 7 days 10/31/20   Charlynne Pander, MD  EPINEPHrine (EPIPEN JR) 0.15 MG/0.3ML injection INJECT 0.3 MLS (0.15 MG TOTAL) INTO THE MUSCLE AS NEEDED FOR  ANAPHYLAXIS. Patient not taking: Reported on 09/12/2019 12/16/18   Oretha Milch, MD  EPINEPHRINE 0.3 mg/0.3 mL IJ SOAJ injection INJECT 0.3 MG INTO THE MUSCLE AS NEEDED FOR ANAPHYLAXIS Patient not taking: No sig reported 03/08/19   Oretha Milch, MD  gemfibrozil (LOPID) 600 MG tablet Take 600 mg by mouth 2 (two) times daily. 10/11/19   [provider]  HYDROcodone-acetaminophen (NORCO/VICODIN) 5-325 MG tablet Take 1 tablet by mouth every 6 (six) hours as needed. 10/31/20   Charlynne Pander, MD  ipratropium-albuterol (DUONEB) 0.5-2.5 (3) MG/3ML SOLN Take 3 mLs by nebulization every 2 (two) hours as needed. 10/03/15   Barrett, Rolm Gala, PA-C  Levonorgestrel-Ethinyl Estradiol (CAMRESE) 0.15-0.03 &0.01 MG tablet Take 1 tablet by mouth daily. 05/14/18   Constant, Peggy, MD  metoprolol succinate (TOPROL-XL) 25 MG 24 hr tablet Take 1 tablet (25 mg total) by mouth daily. 07/19/19   Bethel Born, PA-C  metroNIDAZOLE (FLAGYL) 500 MG tablet Take 1 tablet (500 mg total) by mouth 2 (two) times daily. One po bid x 7 days 10/31/20   Charlynne Pander, MD  mometasone-formoterol [facility]) 200-5 MCG/ACT AERO Inhale 2 puffs into the lungs 2 (two) times daily. 08/19/18   Oretha Milch, MD  ondansetron (ZOFRAN ODT) 4 MG disintegrating tablet 4mg  ODT q4 hours prn nausea/vomit 10/31/20   14/7/21, MD  potassium chloride SA (KLOR-CON) 20 MEQ tablet Take 2 tablets (40 mEq total) by mouth 2 (two) times daily for 5 days. 12/04/19 12/09/19  12/11/19, MD  sucralfate (CARAFATE) 1 GM/10ML suspension Take 10 mLs (1 g total) by mouth 4 (four) times daily -  with meals and at bedtime. 04/22/20   04/24/20, MD  traZODone (DESYREL) 50 MG tablet Take 50 mg by mouth at bedtime as needed for sleep.    [provider]    Allergies    Ferrous gluconate, Adhesive [tape], Penicillins, Chantix [varenicline], Dust mite mixed allergen ext [mite (d. farinae)], Other, Oxycodone, Amoxapine, and  Augmentin [amoxicillin-pot clavulanate]  Review of Systems   Review of Systems  Constitutional:  Negative for chills and fever.  HENT:  Negative for congestion and trouble swallowing.   Eyes:  Negative for discharge.  Respiratory:  Negative for shortness of breath.   Gastrointestinal:  Negative for vomiting.  Skin:  Positive for rash.  Neurological:  Negative for headaches.  Psychiatric/Behavioral:  Negative for confusion.    Physical Exam  Updated Vital Signs BP 118/84   Pulse 82   Temp 98.4 F (36.9 C)   Resp 16   Ht 5' 8.5" (1.74 m)   Wt 128 kg   SpO2 99%   BMI 42.28 kg/m   Physical Exam Vitals and nursing note reviewed.  Constitutional:      General: She is not in acute distress.    Appearance: She is well-developed. She is not ill-appearing or toxic-appearing.  HENT:     Head: Normocephalic and atraumatic.     Mouth/Throat:     Mouth: Mucous membranes are moist.     Pharynx: Oropharynx is clear.     Comments: No angio edema. Eyes:     General: No scleral icterus.       Right eye: No discharge.        Left eye: No discharge.  Cardiovascular:     Pulses: Normal pulses.  Pulmonary:     Effort: Pulmonary effort is normal. No respiratory distress.     Breath sounds: Normal breath sounds.  Musculoskeletal:        General: Normal range of motion.     Cervical back: Normal range of motion.  Skin:    General: Skin is warm and dry.     Coloration: Skin is not pale.     Comments: Urticarial hives to the neck and face.   Neurological:     Mental Status: She is alert.  Psychiatric:        Behavior: Behavior normal.        Thought Content: Thought content normal.        Judgment: Judgment normal.    ED Results / Procedures / Treatments   Labs (all labs ordered are listed, but only abnormal results are displayed) Labs Reviewed  COMPREHENSIVE METABOLIC PANEL - Abnormal; Notable for the following components:      Result Value   Glucose, Bld 143 (*)    All other  components within normal limits    EKG None  Radiology No results found.  Procedures Procedures   Medications Ordered in ED Medications  methylPREDNISolone sodium succinate (SOLU-MEDROL) 125 mg/2 mL injection 125 mg (125 mg Intravenous Given 10/08/21 2020)  famotidine (PEPCID) tablet 20 mg (20 mg Oral Given 10/08/21 2018)  diphenhydrAMINE (BENADRYL) injection 25 mg (25 mg Intravenous Given 10/08/21 2026)  famotidine (PEPCID) tablet 20 mg (20 mg Oral Given 10/08/21 2141)  LORazepam (ATIVAN) injection 0.5 mg (0.5 mg Intravenous Given 10/08/21 2140)    ED Course  I have reviewed the triage vital signs and the nursing notes.  Pertinent labs & imaging results that were available during my care of the patient were reviewed by me and considered in my medical decision making (see chart for details).    MDM Rules/Calculators/A&P                           Pt with urticarial hives. Treated with benadryl, pepcid and steroids in ed. No signs of anaphylaxis. Normal liver function. Will treated with steroids, atarax and pepcid at home. Dicussed follow up and return precautions.  Final Clinical Impression(s) / ED Diagnoses Final diagnoses:  Urticaria    Rx / DC Orders ED Discharge Orders          Ordered    predniSONE (DELTASONE) 20 MG tablet  Daily        10/08/21 2215    hydrOXYzine (ATARAX/VISTARIL) 25 MG tablet  Every 6 hours  10/08/21 2215             Rise Mu, PA-C 10/08/21 2319    Tegeler, Canary Brim, MD 10/08/21 [phone]

## 2021-10-08 NOTE — Discharge Instructions (Signed)
Start taking your steroids tomorrow for the next 4 days.  Have given you Atarax to help with your itching.  Take Pepcid daily as well.  Follow-up primary care doctor.  Return to ER with worsening symptoms.

## 2022-03-02 ENCOUNTER — Emergency Department (HOSPITAL_BASED_OUTPATIENT_CLINIC_OR_DEPARTMENT_OTHER): Payer: BLUE CROSS/BLUE SHIELD

## 2022-03-02 ENCOUNTER — Emergency Department (HOSPITAL_BASED_OUTPATIENT_CLINIC_OR_DEPARTMENT_OTHER)
Admission: EM | Admit: 2022-03-02 | Discharge: 2022-03-02 | Disposition: A | Discharge status: Home / Self Care | Payer: BLUE CROSS/BLUE SHIELD | Attending: Emergency Medicine | Admitting: Emergency Medicine

## 2022-03-02 ENCOUNTER — Other Ambulatory Visit: Payer: Self-pay

## 2022-03-02 ENCOUNTER — Encounter (HOSPITAL_BASED_OUTPATIENT_CLINIC_OR_DEPARTMENT_OTHER): Payer: Self-pay | Admitting: Emergency Medicine

## 2022-03-02 DIAGNOSIS — H9202 Otalgia, left ear: Secondary | ICD-10-CM | POA: Diagnosis not present

## 2022-03-02 LAB — CBC WITH DIFFERENTIAL/PLATELET
Abs Immature Granulocytes: 0.02 10*3/uL (ref 0.00–0.07)
Basophils Absolute: 0.1 10*3/uL (ref 0.0–0.1)
Basophils Relative: 1 %
Eosinophils Absolute: 0.3 10*3/uL (ref 0.0–0.5)
Eosinophils Relative: 3 %
HCT: 36.1 % (ref 36.0–46.0)
Hemoglobin: 11 g/dL — ABNORMAL LOW (ref 12.0–15.0)
Immature Granulocytes: 0 %
Lymphocytes Relative: 32 %
Lymphs Abs: 2.9 10*3/uL (ref 0.7–4.0)
MCH: 19 pg — ABNORMAL LOW (ref 26.0–34.0)
MCHC: 30.5 g/dL (ref 30.0–36.0)
MCV: 62.3 fL — ABNORMAL LOW (ref 80.0–100.0)
Monocytes Absolute: 0.4 10*3/uL (ref 0.1–1.0)
Monocytes Relative: 4 %
Neutro Abs: 5.5 10*3/uL (ref 1.7–7.7)
Neutrophils Relative %: 60 %
Platelets: 316 10*3/uL (ref 150–400)
RBC: 5.79 MIL/uL — ABNORMAL HIGH (ref 3.87–5.11)
RDW: 17.3 % — ABNORMAL HIGH (ref 11.5–15.5)
WBC: 9.1 10*3/uL (ref 4.0–10.5)
nRBC: 0 % (ref 0.0–0.2)

## 2022-03-02 LAB — BASIC METABOLIC PANEL
Anion gap: 11 (ref 5–15)
BUN: 14 mg/dL (ref 6–20)
CO2: 25 mmol/L (ref 22–32)
Calcium: 9.3 mg/dL (ref 8.9–10.3)
Chloride: 103 mmol/L (ref 98–111)
Creatinine, Ser: 0.7 mg/dL (ref 0.44–1.00)
GFR, Estimated: >60 mL/min (ref >60)
Glucose, Bld: 97 mg/dL (ref 70–99)
Potassium: 3.6 mmol/L (ref 3.5–5.1)
Sodium: 139 mmol/L (ref 135–145)

## 2022-03-02 MED ORDER — FLUTICASONE PROPIONATE 50 MCG/ACT NA SUSP: 1.0000 | Freq: Every day | NASAL | 0 refills | Status: AC

## 2022-03-02 MED ORDER — GABAPENTIN 300 MG PO CAPS: 300.0000 mg | ORAL_CAPSULE | Freq: Three times a day (TID) | ORAL | 0 refills | Status: AC

## 2022-03-02 NOTE — ED Provider Notes (Signed)
?[facility] EMERGENCY DEPT ?Provider Note ? ? ?CSN: [id number] ?Arrival date & time: 03/02/22  1856 ? ?  ? ?History ? ?Chief Complaint  ?Patient presents with  ? Otalgia  ? ? ?Crystal Myers is a 41 y.o. female who presents to the emergency department complaining of left ear pain onset yesterday.  She notes that she has been treated for ear infections with her most recent p.o. antibiotic being 3 weeks ago.  She has never been evaluated by ENT for this.  Denies cough, ear discharge, fever, rhinorrhea, sore throat.  Denies past medical history of diabetes. ? ? ?The history is provided by the patient. No language interpreter was used.  ? ?  ? ?Home Medications ?Prior to Admission medications   ?Medication Sig Start Date End Date Taking? Authorizing Provider  ?albuterol (PROAIR HFA) 108 (90 Base) MCG/ACT inhaler Inhale 2 puffs into the lungs every 4 (four) hours as needed for wheezing or shortness of breath. ?Patient not taking: Reported on 12/04/2019 08/19/18   Oretha Milch, MD  ?atorvastatin (LIPITOR) 40 MG tablet Take 1 tablet (40 mg total) by mouth daily at 6 PM. 07/19/19   Bethel Born, PA-C  ?chlorthalidone (HYGROTON) 25 MG tablet Take 25 mg by mouth daily. 11/12/19   [provider]  ?ciprofloxacin (CIPRO) 500 MG tablet Take 1 tablet (500 mg total) by mouth 2 (two) times daily. One po bid x 7 days 10/31/20   Charlynne Pander, MD  ?EPINEPHrine (EPIPEN JR) 0.15 MG/0.3ML injection INJECT 0.3 MLS (0.15 MG TOTAL) INTO THE MUSCLE AS NEEDED FOR ANAPHYLAXIS. ?Patient not taking: Reported on 09/12/2019 12/16/18   Oretha Milch, MD  ?EPINEPHRINE 0.3 mg/0.3 mL IJ SOAJ injection INJECT 0.3 MG INTO THE MUSCLE AS NEEDED FOR ANAPHYLAXIS ?Patient not taking: No sig reported 03/08/19   Oretha Milch, MD  ?gemfibrozil (LOPID) 600 MG tablet Take 600 mg by mouth 2 (two) times daily. 10/11/19   [provider]  ?HYDROcodone-acetaminophen (NORCO/VICODIN) 5-325 MG tablet Take 1 tablet by  mouth every 6 (six) hours as needed. 10/31/20   Charlynne Pander, MD  ?hydrOXYzine (ATARAX/VISTARIL) 25 MG tablet Take 1 tablet (25 mg total) by mouth every 6 (six) hours. 10/08/21   Rise Mu, PA-C  ?ipratropium-albuterol (DUONEB) 0.5-2.5 (3) MG/3ML SOLN Take 3 mLs by nebulization every 2 (two) hours as needed. 10/03/15   Barrett, Rolm Gala, PA-C  ?Levonorgestrel-Ethinyl Estradiol (CAMRESE) 0.15-0.03 &0.01 MG tablet Take 1 tablet by mouth daily. 05/14/18   Constant, Peggy, MD  ?metoprolol succinate (TOPROL-XL) 25 MG 24 hr tablet Take 1 tablet (25 mg total) by mouth daily. 07/19/19   Bethel Born, PA-C  ?metroNIDAZOLE (FLAGYL) 500 MG tablet Take 1 tablet (500 mg total) by mouth 2 (two) times daily. One po bid x 7 days 10/31/20   Charlynne Pander, MD  ?mometasone-formoterol [facility]) 200-5 MCG/ACT AERO Inhale 2 puffs into the lungs 2 (two) times daily. 08/19/18   Oretha Milch, MD  ?ondansetron (ZOFRAN ODT) 4 MG disintegrating tablet 4mg  ODT q4 hours prn nausea/vomit 10/31/20   14/7/21, MD  ?potassium chloride SA (KLOR-CON) 20 MEQ tablet Take 2 tablets (40 mEq total) by mouth 2 (two) times daily for 5 days. 12/04/19 12/09/19  12/11/19, MD  ?sucralfate (CARAFATE) 1 GM/10ML suspension Take 10 mLs (1 g total) by mouth 4 (four) times daily -  with meals and at bedtime. 04/22/20   04/24/20, MD  ?traZODone (DESYREL) 50 MG tablet  Take 50 mg by mouth at bedtime as needed for sleep.    [provider]  ?   ? ?Allergies    ?Ferrous gluconate, Adhesive [tape], Penicillins, Chantix [varenicline], Dust mite mixed allergen ext [mite (d. farinae)], Other, Oxycodone, Amoxapine, and Augmentin [amoxicillin-pot clavulanate]   ? ?Review of Systems   ?Review of Systems  ?Constitutional:  Negative for chills and fever.  ?HENT:  Positive for congestion and ear pain. Negative for ear discharge, rhinorrhea, sneezing and sore throat.   ?Respiratory:  Negative for cough.   ?All other systems  reviewed and are negative. ? ?Physical Exam ?Updated Vital Signs ?BP (!) 138/91 (BP Location: Right Arm)   Pulse (!) 106   Temp 98.2 ?F (36.8 ?C)   Resp 18   Wt 123.3 kg   SpO2 100%   BMI 40.73 kg/m?  ?Physical Exam ?Vitals and nursing note reviewed.  ?Constitutional:   ?   General: She is not in acute distress. ?   Appearance: She is not diaphoretic.  ?HENT:  ?   Head: Normocephalic and atraumatic.  ?   Right Ear: Tympanic membrane, ear canal and external ear normal.  ?   Left Ear: There is no impacted cerumen. No foreign body. There is mastoid tenderness.  ?   Ears:  ?   Comments: Erythema to left TM.  Tenderness to palpation noted to left external ear. ?   Mouth/Throat:  ?   Pharynx: No oropharyngeal exudate.  ?Eyes:  ?   General: No scleral icterus. ?   Conjunctiva/sclera: Conjunctivae normal.  ?Cardiovascular:  ?   Rate and Rhythm: Normal rate and regular rhythm.  ?   Pulses: Normal pulses.  ?   Heart sounds: Normal heart sounds.  ?Pulmonary:  ?   Effort: Pulmonary effort is normal. No respiratory distress.  ?   Breath sounds: Normal breath sounds. No wheezing.  ?Abdominal:  ?   General: Bowel sounds are normal.  ?   Palpations: Abdomen is soft. There is no mass.  ?   Tenderness: There is no abdominal tenderness. There is no guarding or rebound.  ?Musculoskeletal:     ?   General: Normal range of motion.  ?   Cervical back: Normal range of motion and neck supple.  ?Skin: ?   General: Skin is warm and dry.  ?Neurological:  ?   Mental Status: She is alert.  ?Psychiatric:     ?   Behavior: Behavior normal.  ? ? ?ED Results / Procedures / Treatments   ?Labs ?(all labs ordered are listed, but only abnormal results are displayed) ?Labs Reviewed - No data to display ? ?EKG ?None ? ?Radiology ?No results found. ? ?Procedures ?Procedures  ? ? ?Medications Ordered in ED ?Medications - No data to display ? ?ED Course/ Medical Decision Making/ A&P ?Clinical Course as of 03/02/22 2203  ?Sat Mar 02, 2022  ?2158 This is  a 62 female presenting to the ED with pain in and around her left ear ongoing for intermittently several weeks.  She did complete a course of antibiotics for acute otitis media, reports that her pain improved and then worsened.  She is having specifically pain along a trigeminal pattern in her face, but also near the left mastoid.  She reports fullness in her ear and pain with chewing.  Here she is afebrile, heart rate returned to normal, she has no leukocytosis on her blood work.  She did have some clinical tenderness of the mass to void  without evident swelling, erythema or edema.  I thought it was reasonable given that she has signs of otitis externa to evaluate for mastoiditis, thankfully CT scan did not show any focal signs of this.   [MT]  ?  ?Clinical Course User Index ?[MT] Terald Sleeperrifan, Matthew J, MD  ? ?                        ?Medical Decision Making ?Amount and/or Complexity of Data Reviewed ?Labs: ordered. ?Radiology: ordered. ? ? ?Pt presents with left ear pain onset yesterday.  Patient states he been treated for ear infections with the most recent p.o. antibiotic being 3 weeks ago.  She has not been evaluated by ENT.  Vital signs stable, patient afebrile.  On exam patient with tenderness to palpation noted to left mastoid.  Tenderness to palpation noted to the left external ear.  Erythema noted to left TM.  Differential diagnosis includes otitis externa, otitis media, mastoiditis.  ? ?Labs:  ?I ordered BMP and CBC with results pending at time of signout ? ?Imaging: ?I ordered imaging studies including CT temporal bones without contrast with results pending at time of signout. ? ? ?Patient case discussed with Dr. Renaye Rakersrifan, who evaluated patient prior to patient care signout.  Plan at signout is pending labs and CT imaging.  Patient care transferred at signout. ? ? ? ?This chart was dictated using voice recognition software, Dragon. Despite the best efforts of this provider to proofread and correct errors,  errors may still occur which can change documentation meaning. ? ?Final Clinical Impression(s) / ED Diagnoses ?Final diagnoses:  ?Left ear pain  ? ? ?Rx / DC Orders ?ED Discharge Orders   ? ? None  ? ?  ? ? ?  ?Mahmood Boehringer, S

## 2022-03-02 NOTE — Discharge Instructions (Signed)
It was a pleasure taking care of you today! ? ?Your labs and imaging findings were negative today.  Attached is information for the on-call ENT specialist, call on/10/23 to set up a follow-up appointment prior to today's ED visit.  You be sent a prescription for Zyrtec, Flonase, gabapentin, take as prescribed.  You may follow-up with your primary care provider as needed.  Return to the emergency department for experiencing increasing/worsening ear pain, drainage, hearing loss, vision changes, headache, vomiting, worsening symptoms. ?

## 2022-03-02 NOTE — ED Triage Notes (Addendum)
Pt has hx of ear infections treated by PCP. Just had one a few weeks ago and finished antibiotics. Felt ok for a week and then pain onset yesterday. Pain in face, ear, cheek tenderness, neck on Left side. Pain with chewing, moving jaw, lying on that side of her face. Never been CTed. Heating pad brings temporary relief.  ?

## 2022-06-10 ENCOUNTER — Emergency Department (HOSPITAL_BASED_OUTPATIENT_CLINIC_OR_DEPARTMENT_OTHER): Payer: BLUE CROSS/BLUE SHIELD

## 2022-06-10 ENCOUNTER — Encounter (HOSPITAL_BASED_OUTPATIENT_CLINIC_OR_DEPARTMENT_OTHER): Payer: Self-pay | Admitting: Emergency Medicine

## 2022-06-10 ENCOUNTER — Other Ambulatory Visit: Payer: Self-pay

## 2022-06-10 ENCOUNTER — Emergency Department (HOSPITAL_BASED_OUTPATIENT_CLINIC_OR_DEPARTMENT_OTHER)
Admission: EM | Admit: 2022-06-10 | Discharge: 2022-06-10 | Disposition: A | Discharge status: Home / Self Care | Payer: BLUE CROSS/BLUE SHIELD | Attending: Emergency Medicine | Admitting: Emergency Medicine

## 2022-06-10 DIAGNOSIS — Z79899 Other long term (current) drug therapy: Secondary | ICD-10-CM | POA: Diagnosis not present

## 2022-06-10 DIAGNOSIS — R748 Abnormal levels of other serum enzymes: Secondary | ICD-10-CM | POA: Insufficient documentation

## 2022-06-10 DIAGNOSIS — Z7951 Long term (current) use of inhaled steroids: Secondary | ICD-10-CM | POA: Diagnosis not present

## 2022-06-10 DIAGNOSIS — R109 Unspecified abdominal pain: Secondary | ICD-10-CM | POA: Diagnosis present

## 2022-06-10 LAB — COMPREHENSIVE METABOLIC PANEL
ALT: 423 U/L — ABNORMAL HIGH (ref 0–44)
AST: 184 U/L — ABNORMAL HIGH (ref 15–41)
Albumin: 4.8 g/dL (ref 3.5–5.0)
Alkaline Phosphatase: 61 U/L (ref 38–126)
Anion gap: 12 (ref 5–15)
BUN: 9 mg/dL (ref 6–20)
CO2: 24 mmol/L (ref 22–32)
Calcium: 9.8 mg/dL (ref 8.9–10.3)
Chloride: 103 mmol/L (ref 98–111)
Creatinine, Ser: 0.67 mg/dL (ref 0.44–1.00)
GFR, Estimated: >60 mL/min (ref >60)
Glucose, Bld: 124 mg/dL — ABNORMAL HIGH (ref 70–99)
Potassium: 4.1 mmol/L (ref 3.5–5.1)
Sodium: 139 mmol/L (ref 135–145)
Total Bilirubin: 0.9 mg/dL (ref 0.3–1.2)
Total Protein: 7.4 g/dL (ref 6.5–8.1)

## 2022-06-10 LAB — CBC
HCT: 37.3 % (ref 36.0–46.0)
Hemoglobin: 11.5 g/dL — ABNORMAL LOW (ref 12.0–15.0)
MCH: 19.4 pg — ABNORMAL LOW (ref 26.0–34.0)
MCHC: 30.8 g/dL (ref 30.0–36.0)
MCV: 62.8 fL — ABNORMAL LOW (ref 80.0–100.0)
Platelets: 248 10*3/uL (ref 150–400)
RBC: 5.94 MIL/uL — ABNORMAL HIGH (ref 3.87–5.11)
RDW: 17.9 % — ABNORMAL HIGH (ref 11.5–15.5)
WBC: 6.5 10*3/uL (ref 4.0–10.5)
nRBC: 0 % (ref 0.0–0.2)

## 2022-06-10 LAB — URINALYSIS, ROUTINE W REFLEX MICROSCOPIC
Bilirubin Urine: NEGATIVE
Glucose, UA: NEGATIVE mg/dL
Hgb urine dipstick: NEGATIVE
Ketones, ur: NEGATIVE mg/dL
Leukocytes,Ua: NEGATIVE
Nitrite: NEGATIVE
Specific Gravity, Urine: 1.025 (ref 1.005–1.030)
pH: 6 (ref 5.0–8.0)

## 2022-06-10 LAB — LIPASE, BLOOD: Lipase: 50 U/L (ref 11–51)

## 2022-06-10 LAB — PREGNANCY, URINE: Preg Test, Ur: NEGATIVE

## 2022-06-10 LAB — ACETAMINOPHEN LEVEL: Acetaminophen (Tylenol), Serum: <10 ug/mL — ABNORMAL LOW (ref 10–30)

## 2022-06-10 MED ORDER — IOHEXOL 300 MG/ML  SOLN
100.0000 mL | Freq: Once | INTRAMUSCULAR | Status: AC | PRN
  Administered 2022-06-10: 100 mL via INTRAVENOUS

## 2022-06-10 MED ORDER — SODIUM CHLORIDE 0.9 % IV BOLUS
1000.0000 mL | Freq: Once | INTRAVENOUS | Status: AC
  Administered 2022-06-10: 1000 mL via INTRAVENOUS

## 2022-06-10 NOTE — ED Notes (Signed)
Pt verbalizes understanding of discharge instructions. Opportunity for questioning and answers were provided. Pt discharged from ED to home with family.    

## 2022-06-10 NOTE — ED Triage Notes (Signed)
Pt h/o weightloss sx Mar 25, 2022 and constipation resulting since.  On Saturday night, pt reports she had severe cramping in her lower mid-abdomen.  Sx came and went, and pt reports syncopal episode Sat night around 3am in bathroom - pt found conscious on floor by husband but she does not remember falling.  Pt reports continued mild lower mid-ab pain and nausea since. No additional syncopal episodes since sat night.

## 2022-06-10 NOTE — ED Provider Notes (Addendum)
[facility] EMER[facility]ENCY DEPT Provider Note   CSN: [id number] Arrival date & time: 06/10/22  1453     History  Chief Complaint  Patient presents with   Abdominal Pain    Crystal Myers is a 41 y.o. female with a past medical history of gastric sleeve, cholecystectomy, appendectomy and migraines presenting today with multiple concerns.  She felt as though she was constipated because she was having abdominal cramping and burning.  She took a lot of laxatives and stool softeners which then made her vomit.  She got up early Sunday morning to go try and have a bowel movement when she potentially lost consciousness.  She remembers waking up on the ground and feeling sore in her neck and shoulder.  Today she still feels the soreness as well as abdominal cramping.  Gastric sleeve surgery was May 1.  Of note she is on day 5 or 6 of nitrofurantoin for her UTI.   Abdominal Pain      Home Medications Prior to Admission medications   Medication Sig Start Date End Date Taking? Authorizing Provider  albuterol (PROAIR HFA) 108 (90 Base) MCG/ACT inhaler Inhale 2 puffs into the lungs every 4 (four) hours as needed for wheezing or shortness of breath. Patient not taking: Reported on 12/04/2019 08/19/18   Oretha Milch, MD  atorvastatin (LIPITOR) 40 MG tablet Take 1 tablet (40 mg total) by mouth daily at 6 PM. 07/19/19   Jefm Bryant, Jory Sims, PA-C  chlorthalidone (HYGROTON) 25 MG tablet Take 25 mg by mouth daily. 11/12/19   [provider]  ciprofloxacin (CIPRO) 500 MG tablet Take 1 tablet (500 mg total) by mouth 2 (two) times daily. One po bid x 7 days 10/31/20   Charlynne Pander, MD  EPINEPHrine (EPIPEN JR) 0.15 MG/0.3ML injection INJECT 0.3 MLS (0.15 MG TOTAL) INTO THE MUSCLE AS NEEDED FOR ANAPHYLAXIS. Patient not taking: Reported on 09/12/2019 12/16/18   Oretha Milch, MD  EPINEPHRINE 0.3 mg/0.3 mL IJ SOAJ injection INJECT 0.3 MG INTO THE MUSCLE AS NEEDED FOR  ANAPHYLAXIS Patient not taking: No sig reported 03/08/19   Oretha Milch, MD  fluticasone (FLONASE) 50 MCG/ACT nasal spray Place 1 spray into both nostrils daily. 03/02/22   Blue, Soijett A, PA-C  gabapentin (NEURONTIN) 300 MG capsule Take 1 capsule (300 mg total) by mouth 3 (three) times daily for 14 days. 03/02/22 03/16/22  Blue, Soijett A, PA-C  gemfibrozil (LOPID) 600 MG tablet Take 600 mg by mouth 2 (two) times daily. 10/11/19   [provider]  HYDROcodone-acetaminophen (NORCO/VICODIN) 5-325 MG tablet Take 1 tablet by mouth every 6 (six) hours as needed. 10/31/20   Charlynne Pander, MD  hydrOXYzine (ATARAX/VISTARIL) 25 MG tablet Take 1 tablet (25 mg total) by mouth every 6 (six) hours. 10/08/21   Rise Mu, PA-C  ipratropium-albuterol (DUONEB) 0.5-2.5 (3) MG/3ML SOLN Take 3 mLs by nebulization every 2 (two) hours as needed. 10/03/15   Barrett, Rolm Gala, PA-C  Levonorgestrel-Ethinyl Estradiol (CAMRESE) 0.15-0.03 &0.01 MG tablet Take 1 tablet by mouth daily. 05/14/18   Constant, Peggy, MD  metoprolol succinate (TOPROL-XL) 25 MG 24 hr tablet Take 1 tablet (25 mg total) by mouth daily. 07/19/19   Bethel Born, PA-C  metroNIDAZOLE (FLAGYL) 500 MG tablet Take 1 tablet (500 mg total) by mouth 2 (two) times daily. One po bid x 7 days 10/31/20   Charlynne Pander, MD  mometasone-formoterol [facility]) 200-5 MCG/ACT AERO Inhale 2 puffs into the lungs 2 (  two) times daily. 08/19/18   Oretha Milch, MD  ondansetron (ZOFRAN ODT) 4 MG disintegrating tablet 4mg  ODT q4 hours prn nausea/vomit 10/31/20   Charlynne Pander, MD  potassium chloride SA (KLOR-CON) 20 MEQ tablet Take 2 tablets (40 mEq total) by mouth 2 (two) times daily for 5 days. 12/04/19 12/09/19  Pricilla Loveless, MD  sucralfate (CARAFATE) 1 GM/10ML suspension Take 10 mLs (1 g total) by mouth 4 (four) times daily -  with meals and at bedtime. 04/22/20   Nira Conn, MD  traZODone (DESYREL) 50 MG tablet Take 50 mg by mouth at  bedtime as needed for sleep.    [provider]      Allergies    Ferrous gluconate, Adhesive [tape], Penicillins, Chantix [varenicline], Dust mite mixed allergen ext [mite (d. farinae)], Other, Oxycodone, Amoxapine, and Augmentin [amoxicillin-pot clavulanate]    Review of Systems   Review of Systems  Gastrointestinal:  Positive for abdominal pain.    Physical Exam Updated Vital Signs BP (!) 142/108 (BP Location: Right Arm)   Pulse 94   Temp 98.7 F (37.1 C)   Resp 14   LMP  (LMP Unknown) Comment: Pt on BC  SpO2 99%  Physical Exam Vitals and nursing note reviewed.  Constitutional:      General: She is not in acute distress.    Appearance: Normal appearance. She is not ill-appearing.  HENT:     Head: Normocephalic and atraumatic.  Eyes:     General: No scleral icterus.    Extraocular Movements: Extraocular movements intact.     Conjunctiva/sclera: Conjunctivae normal.     Pupils: Pupils are equal, round, and reactive to light.  Cardiovascular:     Rate and Rhythm: Normal rate and regular rhythm.  Pulmonary:     Effort: Pulmonary effort is normal. No respiratory distress.     Breath sounds: No wheezing.  Abdominal:     General: Abdomen is flat.     Palpations: Abdomen is soft.     Tenderness: There is no abdominal tenderness.  Skin:    Findings: No rash.  Neurological:     Mental Status: She is alert.  Psychiatric:        Mood and Affect: Mood normal.    ED Results / Procedures / Treatments   Labs (all labs ordered are listed, but only abnormal results are displayed) Labs Reviewed  COMPREHENSIVE METABOLIC PANEL - Abnormal; Notable for the following components:      Result Value   Glucose, Bld 124 (*)    AST 184 (*)    ALT 423 (*)    All other components within normal limits  CBC - Abnormal; Notable for the following components:   RBC 5.94 (*)    Hemoglobin 11.5 (*)    MCV 62.8 (*)    MCH 19.4 (*)    RDW 17.9 (*)    All other components within  normal limits  URINALYSIS, ROUTINE W REFLEX MICROSCOPIC - Abnormal; Notable for the following components:   Protein, ur TRACE (*)    All other components within normal limits  LIPASE, BLOOD  PREGNANCY, URINE    EKG None  Radiology CT ABDOMEN PELVIS W CONTRAST  Result Date: 06/10/2022 CLINICAL DATA:  Umbilical pain for 1 week, nausea, vomiting EXAM: CT ABDOMEN AND PELVIS WITH CONTRAST TECHNIQUE: Multidetector CT imaging of the abdomen and pelvis was performed using the standard protocol following bolus administration of intravenous contrast. RADIATION DOSE REDUCTION: This exam was performed according to  the departmental dose-optimization program which includes automated exposure control, adjustment of the mA and/or kV according to patient size and/or use of iterative reconstruction technique. CONTRAST:  OMNIPAQUE IOHEXOL 300 MG/ML  SOLN COMPARISON:  CT abdomen/pelvis 09/07/2021 FINDINGS: Lower chest: The imaged lung bases are clear. The imaged heart is unremarkable. Hepatobiliary: The liver is unremarkable. The gallbladder is surgically absent. There is no biliary ductal dilatation. Pancreas: Unremarkable. Spleen: Unremarkable. Adrenals/Urinary Tract: The adrenals are unremarkable. The kidneys are unremarkable, with no focal lesion, stone, hydronephrosis, or hydroureter. The bladder is decompressed but grossly unremarkable. Stomach/Bowel: Postsurgical changes are noted reflecting gastric sleeve surgery, without evidence of complication. There is no evidence of bowel obstruction. There is no abnormal bowel wall thickening or inflammatory change. The appendix is surgically absent. Vascular/Lymphatic: The abdominal aorta is normal in course and caliber. The major branch vessels are patent. The main portal and splenic veins are patent. There is no abdominal or pelvic lymphadenopathy. Reproductive: Uterus and adnexa are unremarkable. Other: There is trace free fluid in the pelvis, nonspecific. There is  no free intraperitoneal air. Postsurgical changes are noted along the anterior abdominal wall. Musculoskeletal: There is no acute osseous abnormality or suspicious osseous lesion. IMPRESSION: 1. No acute finding in the abdomen or pelvis. 2. Postsurgical changes reflecting gastric sleeve surgery without evidence of complication. No evidence of bowel obstruction. 3. Small volume free fluid in the pelvis, nonspecific. Electronically Signed   By: Lesia Hausen M.D.   On: 06/10/2022 17:41    Procedures Procedures   Medications Ordered in ED Medications  sodium chloride 0.9 % bolus 1,000 mL (1,000 mLs Intravenous New Bag/Given 06/10/22 1652)  iohexol (OMNIPAQUE) 300 MG/ML solution 100 mL (100 mLs Intravenous Contrast Given 06/10/22 1709)    ED Course/ Medical Decision Making/ A&P                           Medical Decision Making Amount and/or Complexity of Data Reviewed Labs: ordered. Radiology: ordered.  Risk Prescription drug management.   This patient presents to the ED for concern of abdominal discomfort and potential syncope.  Differential includes but is not limited to pancreatitis, gastroenteritis, bowel obstruction, malignancy, orthostatic hypotension, electrolyte abnormality, hypo/hyperglycemia, medication reaction.   This is not an exhaustive differential.    Past Medical History / Co-morbidities / Social History: History of gastric sleeve surgery as well as cholecystectomy and appendectomy    Physical Exam: No pertinent physical exam findings  Lab Tests: I ordered, and personally interpreted labs.  The pertinent results include:  -ALT 423, AST 184 -Microcytic anemia at baseline   Imaging Studies: I ordered and independently visualized and interpreted CT abdomen pelvis which showed no acute findings.  No abnormalities noted around the liver to explain lab results.  I agree with the radiologist interpretation.    Medications: I ordered fluid bolus.  Reevaluation shows  that she feels somewhat better.   Disposition: 41 year old female who presented today issues with lower abdominal cramping and questionable syncopal episode.  Work-up nonrevealing for cause of her potential syncope.  She has been worked up by cardiology for tachycardia and may follow-up with them.  She has not seen neurology and I believe this is reasonable for the multiple questionable syncopal episodes that she has experienced over the past few years.  Her work-up revealed elevated liver enzymes.  Hepatitis panel was ordered however it is pending at this time.  I was able to get in contact with  the [facility] lab who says that it will be several more hours.  I discussed this with the patient and she says she would be agreeable to discharge with telephone notification of the results.  I will call her with her results later today or tomorrow when they result and discuss next steps.  Return precautions were discussed for the interim.  She will stop taking the nitrofurantoin in the event that this is caused her transaminitis.    I discussed this case with my attending physician Dr. Karene Fry who cosigned this note including patient's presenting symptoms, physical exam, and planned diagnostics and interventions. Attending physician stated agreement with plan or made changes to plan which were implemented.     Final Clinical Impression(s) / ED Diagnoses Final diagnoses:  Elevated liver enzymes    Rx / DC Orders ED Discharge Orders     None      Results and diagnoses were explained to the patient. Return precautions discussed in full. Patient had no additional questions and expressed complete understanding.   This chart was dictated using voice recognition software.  Despite best efforts to proofread,  errors can occur which can change the documentation meaning.    Woodroe Chen 06/10/22 2118    Ernie Avena, MD 06/10/22 2335   I spoke with the patient regarding her negative  hepatitis panel and encouraged follow-up with her pcp and GI. She will avoid tylenol and ETOH for the time being. All questions were answered.   Woodroe Chen 06/11/22 1207    Ernie Avena, MD 06/11/22 1444

## 2022-06-10 NOTE — Discharge Instructions (Signed)
Be on the look out for a phone call about the results of your hepatitis panel.  At this time stop taking the antibiotic.

## 2022-06-11 LAB — HEPATITIS PANEL, ACUTE
HCV Ab: NONREACTIVE
Hep A IgM: NONREACTIVE
Hep B C IgM: NONREACTIVE
Hepatitis B Surface Ag: NONREACTIVE

## 2022-06-12 LAB — EPSTEIN-BARR VIRUS (EBV) ANTIBODY PROFILE
EBV NA IgG: 85.9 U/mL — ABNORMAL HIGH (ref 0.0–17.9)
EBV VCA IgG: 257 U/mL — ABNORMAL HIGH (ref 0.0–17.9)
EBV VCA IgM: <36 U/mL (ref 0.0–35.9)

## 2022-09-20 ENCOUNTER — Other Ambulatory Visit: Payer: Self-pay

## 2022-09-20 ENCOUNTER — Emergency Department (HOSPITAL_COMMUNITY): Payer: BLUE CROSS/BLUE SHIELD

## 2022-09-20 ENCOUNTER — Encounter (HOSPITAL_COMMUNITY): Payer: Self-pay | Admitting: *Deleted

## 2022-09-20 ENCOUNTER — Emergency Department (HOSPITAL_COMMUNITY)
Admission: EM | Admit: 2022-09-20 | Discharge: 2022-09-21 | Discharge status: Against Medical Advice | Payer: BLUE CROSS/BLUE SHIELD | Attending: Student | Admitting: Student

## 2022-09-20 DIAGNOSIS — R110 Nausea: Secondary | ICD-10-CM | POA: Insufficient documentation

## 2022-09-20 DIAGNOSIS — R0602 Shortness of breath: Secondary | ICD-10-CM | POA: Diagnosis not present

## 2022-09-20 DIAGNOSIS — R079 Chest pain, unspecified: Secondary | ICD-10-CM | POA: Insufficient documentation

## 2022-09-20 DIAGNOSIS — R42 Dizziness and giddiness: Secondary | ICD-10-CM | POA: Insufficient documentation

## 2022-09-20 DIAGNOSIS — Z5321 Procedure and treatment not carried out due to patient leaving prior to being seen by health care provider: Secondary | ICD-10-CM | POA: Diagnosis not present

## 2022-09-20 LAB — CBC WITH DIFFERENTIAL/PLATELET
Abs Immature Granulocytes: 0.03 10*3/uL (ref 0.00–0.07)
Basophils Absolute: 0.1 10*3/uL (ref 0.0–0.1)
Basophils Relative: 1 %
Eosinophils Absolute: 0.6 10*3/uL — ABNORMAL HIGH (ref 0.0–0.5)
Eosinophils Relative: 6 %
HCT: 34.3 % — ABNORMAL LOW (ref 36.0–46.0)
Hemoglobin: 10.6 g/dL — ABNORMAL LOW (ref 12.0–15.0)
Immature Granulocytes: 0 %
Lymphocytes Relative: 35 %
Lymphs Abs: 3.2 10*3/uL (ref 0.7–4.0)
MCH: 19.7 pg — ABNORMAL LOW (ref 26.0–34.0)
MCHC: 30.9 g/dL (ref 30.0–36.0)
MCV: 63.8 fL — ABNORMAL LOW (ref 80.0–100.0)
Monocytes Absolute: 0.4 10*3/uL (ref 0.1–1.0)
Monocytes Relative: 4 %
Neutro Abs: 4.9 10*3/uL (ref 1.7–7.7)
Neutrophils Relative %: 54 %
Platelets: 253 10*3/uL (ref 150–400)
RBC: 5.38 MIL/uL — ABNORMAL HIGH (ref 3.87–5.11)
RDW: 16.4 % — ABNORMAL HIGH (ref 11.5–15.5)
WBC: 9.2 10*3/uL (ref 4.0–10.5)
nRBC: 0.2 % (ref 0.0–0.2)

## 2022-09-20 LAB — LIPASE, BLOOD: Lipase: 42 U/L (ref 11–51)

## 2022-09-20 LAB — COMPREHENSIVE METABOLIC PANEL
ALT: 31 U/L (ref 0–44)
AST: 21 U/L (ref 15–41)
Albumin: 3.7 g/dL (ref 3.5–5.0)
Alkaline Phosphatase: 47 U/L (ref 38–126)
Anion gap: 10 (ref 5–15)
BUN: 12 mg/dL (ref 6–20)
CO2: 22 mmol/L (ref 22–32)
Calcium: 8.9 mg/dL (ref 8.9–10.3)
Chloride: 107 mmol/L (ref 98–111)
Creatinine, Ser: 0.68 mg/dL (ref 0.44–1.00)
GFR, Estimated: >60 mL/min (ref >60)
Glucose, Bld: 103 mg/dL — ABNORMAL HIGH (ref 70–99)
Potassium: 3.7 mmol/L (ref 3.5–5.1)
Sodium: 139 mmol/L (ref 135–145)
Total Bilirubin: 0.6 mg/dL (ref 0.3–1.2)
Total Protein: 6.7 g/dL (ref 6.5–8.1)

## 2022-09-20 LAB — I-STAT BETA HCG BLOOD, ED (MC, WL, AP ONLY): I-stat hCG, quantitative: <5 m[IU]/mL (ref <5)

## 2022-09-20 LAB — TROPONIN I (HIGH SENSITIVITY): Troponin I (High Sensitivity): <2 ng/L (ref <18)

## 2022-09-20 NOTE — ED Notes (Signed)
Patient left on own accord °

## 2022-09-20 NOTE — ED Triage Notes (Signed)
The pt has had some chest pain for 3-4 days with sob nausea and dizziness  she has coronary artery syndrome

## 2022-09-20 NOTE — ED Provider Triage Note (Signed)
Emergency Medicine Provider Triage Evaluation Note  Crystal Myers , Myers 41 y.o. female  was evaluated in triage.  Pt complains of headache and chest pain.  Began on Sunday.  Initially intermittent.  Chest pain resolved however over the last few days has been more constant.  Located central chest into her epigastric region and into her left breast.  Some associated shortness of breath.  No necessarily exertional or pleuritic in nature.  No diaphoresis, nausea or vomiting.  History of CAD.  No lower extremity pain or swelling.  Review of Systems  Positive: CP, SOB Negative: Fever, cough, numbness, weakness, LE swelling  Physical Exam  BP (!) 153/104   Pulse 85   Resp 18   SpO2 100%  Gen:   Awake, no distress   Resp:  Normal effort  MSK:   Moves extremities without difficulty  Other:    Medical Decision Making  Medically screening exam initiated at 10:31 PM.  Appropriate orders placed.  Crystal Myers was informed that the remainder of the evaluation will be completed by another provider, this initial triage assessment does not replace that evaluation, and the importance of remaining in the ED until their evaluation is complete.  COP, SOB   Crystal Nylund A, PA-C 09/20/22 2232

## 2022-09-30 ENCOUNTER — Encounter (HOSPITAL_BASED_OUTPATIENT_CLINIC_OR_DEPARTMENT_OTHER): Payer: Self-pay

## 2022-09-30 ENCOUNTER — Emergency Department (HOSPITAL_BASED_OUTPATIENT_CLINIC_OR_DEPARTMENT_OTHER): Payer: BLUE CROSS/BLUE SHIELD | Admitting: Radiology

## 2022-09-30 ENCOUNTER — Other Ambulatory Visit: Payer: Self-pay

## 2022-09-30 DIAGNOSIS — Z87891 Personal history of nicotine dependence: Secondary | ICD-10-CM | POA: Insufficient documentation

## 2022-09-30 DIAGNOSIS — Z79899 Other long term (current) drug therapy: Secondary | ICD-10-CM | POA: Insufficient documentation

## 2022-09-30 DIAGNOSIS — Z7984 Long term (current) use of oral hypoglycemic drugs: Secondary | ICD-10-CM | POA: Diagnosis not present

## 2022-09-30 DIAGNOSIS — Z7951 Long term (current) use of inhaled steroids: Secondary | ICD-10-CM | POA: Insufficient documentation

## 2022-09-30 DIAGNOSIS — R079 Chest pain, unspecified: Secondary | ICD-10-CM | POA: Diagnosis present

## 2022-09-30 DIAGNOSIS — J45909 Unspecified asthma, uncomplicated: Secondary | ICD-10-CM | POA: Diagnosis not present

## 2022-09-30 DIAGNOSIS — I10 Essential (primary) hypertension: Secondary | ICD-10-CM | POA: Insufficient documentation

## 2022-09-30 LAB — TROPONIN I (HIGH SENSITIVITY): Troponin I (High Sensitivity): <2 ng/L (ref <18)

## 2022-09-30 LAB — BASIC METABOLIC PANEL
Anion gap: 11 (ref 5–15)
BUN: 15 mg/dL (ref 6–20)
CO2: 25 mmol/L (ref 22–32)
Calcium: 9.4 mg/dL (ref 8.9–10.3)
Chloride: 105 mmol/L (ref 98–111)
Creatinine, Ser: 0.78 mg/dL (ref 0.44–1.00)
GFR, Estimated: >60 mL/min (ref >60)
Glucose, Bld: 113 mg/dL — ABNORMAL HIGH (ref 70–99)
Potassium: 3.8 mmol/L (ref 3.5–5.1)
Sodium: 141 mmol/L (ref 135–145)

## 2022-09-30 LAB — CBC
HCT: 35.4 % — ABNORMAL LOW (ref 36.0–46.0)
Hemoglobin: 10.6 g/dL — ABNORMAL LOW (ref 12.0–15.0)
MCH: 19.3 pg — ABNORMAL LOW (ref 26.0–34.0)
MCHC: 29.9 g/dL — ABNORMAL LOW (ref 30.0–36.0)
MCV: 64.4 fL — ABNORMAL LOW (ref 80.0–100.0)
Platelets: 273 10*3/uL (ref 150–400)
RBC: 5.5 MIL/uL — ABNORMAL HIGH (ref 3.87–5.11)
RDW: 16.5 % — ABNORMAL HIGH (ref 11.5–15.5)
WBC: 8.2 10*3/uL (ref 4.0–10.5)
nRBC: 0 % (ref 0.0–0.2)

## 2022-09-30 NOTE — ED Triage Notes (Addendum)
Pt presents to the ED with CP that started today. Has been to the hospital multiple times r/t this issue. States that she has a hx of coronary artery syndrome. Pt reports left arm heaviness and numbness that started today as well. Pt reports numbness in left side of face, left arm, and left leg has been there and she has seen her cardiologist for this as well as the intermittent chest pain. NIH 1. No drift noted in extremities. Pt ambulatory to exam room. EKG obtained

## 2022-09-30 NOTE — ED Notes (Signed)
Pt states that arm heaviness started about 30 minutes ago. Requested MD to see patient in triage.

## 2022-10-01 ENCOUNTER — Emergency Department (HOSPITAL_BASED_OUTPATIENT_CLINIC_OR_DEPARTMENT_OTHER)
Admission: EM | Admit: 2022-10-01 | Discharge: 2022-10-01 | Disposition: A | Discharge status: Home / Self Care | Payer: BLUE CROSS/BLUE SHIELD | Attending: Emergency Medicine | Admitting: Emergency Medicine

## 2022-10-01 DIAGNOSIS — J4540 Moderate persistent asthma, uncomplicated: Secondary | ICD-10-CM

## 2022-10-01 DIAGNOSIS — R079 Chest pain, unspecified: Secondary | ICD-10-CM

## 2022-10-01 LAB — TROPONIN I (HIGH SENSITIVITY): Troponin I (High Sensitivity): <2 ng/L (ref <18)

## 2022-10-01 MED ORDER — ALBUTEROL SULFATE HFA 108 (90 BASE) MCG/ACT IN AERS: 2.0000 | INHALATION_SPRAY | RESPIRATORY_TRACT | 0 refills | Status: AC | PRN

## 2022-10-01 MED ORDER — NITROGLYCERIN 0.4 MG SL SUBL
0.4000 mg | SUBLINGUAL_TABLET | SUBLINGUAL | Status: DC | PRN
  Administered 2022-10-01 (×2): 0.4 mg via SUBLINGUAL
  Filled 2022-10-01 (×2): qty 1

## 2022-10-01 NOTE — ED Notes (Signed)
Pt verbalizes understanding of discharge instructions. Opportunity for questioning and answers were provided. Pt discharged from ED to home with family.    

## 2022-10-01 NOTE — ED Provider Notes (Signed)
[facility] Provider Note: Georgena Spurling, MD, FACEP  CSN: [id number] MRN: [id number] ARRIVAL: 09/30/22 at 2057 ROOM: [place]  Chest Pain   HISTORY OF PRESENT ILLNESS  10/01/22 1:27 AM Crystal Myers is a 41 y.o. female who has been having intermittent chest pain and PVCs for several years.  These have worsened over the past several weeks.  She is here with another episode of chest pain that started yesterday evening while driving.  This was more severe than usual.  The pain is still present but not as severe as it was earlier.  It has been associated with left arm heaviness and paresthesias that often accompanies her chest pain.  It has also been associated with shortness of breath.  She was prescribed Cardizem and nitroglycerin by her cardiologist at [facility].  She did not take any nitroglycerin for this episode.  The patient's cardiology visit from 09/25/2022 was reviewed.  Heart catheterization in 08/2019 showed no coronary artery stenoses and no abnormal wall motion.  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  This was the reason the Cardizem was prescribed.   Past Medical History:  Diagnosis Date   ADHD    Anemia    Anxiety    Asthma    Chest pain    Chlamydia    Depression    Headache    Hypertension     Past Surgical History:  Procedure Laterality Date   ADENOIDECTOMY     APPENDECTOMY     CERVICAL BIOPSY  W/ LOOP ELECTRODE EXCISION     CERVICAL POLYPECTOMY     CESAREAN SECTION     CESAREAN SECTION     x4   CHOLECYSTECTOMY N/A 12/24/2014   Procedure: LAPAROSCOPIC CHOLECYSTECTOMY WITH INTRAOPERATIVE CHOLANGIOGRAM;  Surgeon: Excell Seltzer, MD;  Location: [facility];  Service: General;  Laterality: N/A;   DILATION AND CURETTAGE OF UTERUS     LEFT HEART CATH AND CORONARY ANGIOGRAPHY N/A 09/14/2019   Procedure: LEFT HEART CATH AND  CORONARY ANGIOGRAPHY;  Surgeon: Dixie Dials, MD;  Location: [facility];  Service: Cardiovascular;  Laterality: N/A;   LYMPHADENECTOMY     TONSILLECTOMY     TUBAL LIGATION      Family History  Problem Relation Age of Onset   Diabetes Mother    Hypertension Mother    CAD Mother    Allergic rhinitis Mother    Asthma Sister     Social History   Tobacco Use   Smoking status: Former    Packs/day: 0.50    Types: Cigarettes    Quit date: 11/25/2014    Years since quitting: 7.8   Smokeless tobacco: Never  Vaping Use   Vaping Use: Never used  Substance Use Topics   Alcohol use: No    Alcohol/week: 0.0 standard drinks of alcohol    Comment: former   Drug use: No    Prior to Admission medications   Medication Sig Start Date End Date Taking? Authorizing Provider  albuterol (PROAIR HFA) 108 (90 Base) MCG/ACT inhaler Inhale 2 puffs into the lungs every 4 (four) hours as needed for wheezing or shortness of breath. 10/01/22   Taelar Gronewold, MD  atorvastatin (LIPITOR) 40 MG tablet Take 1 tablet (40 mg total) by mouth daily at 6 PM. 07/19/19   Ruthann Cancer, Jesse Fall, PA-C  chlorthalidone (HYGROTON) 25 MG tablet Take 25 mg by mouth  daily. 11/12/19   [provider]  fluticasone (FLONASE) 50 MCG/ACT nasal spray Place 1 spray into both nostrils daily. 03/02/22   Blue, Soijett A, PA-C  gabapentin (NEURONTIN) 300 MG capsule Take 1 capsule (300 mg total) by mouth 3 (three) times daily for 14 days. 03/02/22 03/16/22  Blue, Soijett A, PA-C  gemfibrozil (LOPID) 600 MG tablet Take 600 mg by mouth 2 (two) times daily. 10/11/19   [provider]  HYDROcodone-acetaminophen (NORCO/VICODIN) 5-325 MG tablet Take 1 tablet by mouth every 6 (six) hours as needed. 10/31/20   Drenda Freeze, MD  hydrOXYzine (ATARAX/VISTARIL) 25 MG tablet Take 1 tablet (25 mg total) by mouth every 6 (six) hours. 10/08/21   Doristine Devoid, PA-C  ipratropium-albuterol (DUONEB) 0.5-2.5 (3) MG/3ML SOLN Take 3 mLs  by nebulization every 2 (two) hours as needed. 10/03/15   Barrett, Lahoma Crocker, PA-C  Levonorgestrel-Ethinyl Estradiol (CAMRESE) 0.15-0.03 &0.01 MG tablet Take 1 tablet by mouth daily. 05/14/18   Constant, Peggy, MD  metoprolol succinate (TOPROL-XL) 25 MG 24 hr tablet Take 1 tablet (25 mg total) by mouth daily. 07/19/19   Recardo Evangelist, PA-C  mometasone-formoterol (DULERA) 200-5 MCG/ACT AERO Inhale 2 puffs into the lungs 2 (two) times daily. 08/19/18   Rigoberto Noel, MD  ondansetron (ZOFRAN ODT) 4 MG disintegrating tablet 4mg  ODT q4 hours prn nausea/vomit 10/31/20   Drenda Freeze, MD  potassium chloride SA (KLOR-CON) 20 MEQ tablet Take 2 tablets (40 mEq total) by mouth 2 (two) times daily for 5 days. 12/04/19 12/09/19  Sherwood Gambler, MD  sucralfate (CARAFATE) 1 GM/10ML suspension Take 10 mLs (1 g total) by mouth 4 (four) times daily -  with meals and at bedtime. 04/22/20   Fatima Blank, MD  traZODone (DESYREL) 50 MG tablet Take 50 mg by mouth at bedtime as needed for sleep.    [provider]    Allergies Ferrous gluconate, Adhesive [tape], Penicillins, Dust mite mixed allergen ext [mite (d. farinae)], Other, Oxycodone, Varenicline, Amoxapine, and Augmentin [amoxicillin-pot clavulanate]   REVIEW OF SYSTEMS  Negative except as noted here or in the History of Present Illness.   PHYSICAL EXAMINATION  Initial Vital Signs Blood pressure 121/79, pulse 92, temperature 98.3 F (36.8 C), temperature source Oral, resp. rate 18, height 5' 8.5" (1.74 m), weight 123.3 kg, SpO2 100 %.  Examination General: Well-developed, well-nourished female in no acute distress; appearance consistent with age of record HENT: normocephalic; atraumatic Eyes: Normal appearance Neck: supple Heart: regular rate and rhythm; frequent PVCs Lungs: clear to auscultation bilaterally Abdomen: soft; nondistended; nontender; bowel sounds present Extremities: No deformity; full range of motion; pulses  normal Neurologic: Awake, alert and oriented; motor function intact in all extremities and symmetric; no facial droop Skin: Warm and dry Psychiatric: Normal mood and affect   RESULTS  Summary of this visit's results, reviewed and interpreted by myself:   EKG Interpretation  Date/Time:  Monday September 30 2022 21:11:05 EST Ventricular Rate:  91 PR Interval:  136 QRS Duration: 98 QT Interval:  402 QTC Calculation: [id number] R Axis:   28 Text Interpretation: Sinus rhythm with frequent Premature ventricular complexes Abnormal ECG No significant change was found Confirmed by Kanani Mowbray [phone]) on 10/01/2022 1:28:48 AM       Laboratory Studies: Results for orders placed or performed during the hospital encounter of 10/01/22 (from the past 24 hour(s))  Basic metabolic panel     Status: Abnormal   Collection Time: 09/30/22  9:18 PM  Result  Value Ref Range   Sodium 141 135 - 145 mmol/L   Potassium 3.8 3.5 - 5.1 mmol/L   Chloride 105 98 - 111 mmol/L   CO2 25 22 - 32 mmol/L   Glucose, Bld 113 (H) 70 - 99 mg/dL   BUN 15 6 - 20 mg/dL   Creatinine, Ser 0.78 0.44 - 1.00 mg/dL   Calcium 9.4 8.9 - 10.3 mg/dL   GFR, Estimated >60 >60 mL/min   Anion gap 11 5 - 15  CBC     Status: Abnormal   Collection Time: 09/30/22  9:18 PM  Result Value Ref Range   WBC 8.2 4.0 - 10.5 K/uL   RBC 5.50 (H) 3.87 - 5.11 MIL/uL   Hemoglobin 10.6 (L) 12.0 - 15.0 g/dL   HCT 35.4 (L) 36.0 - 46.0 %   MCV 64.4 (L) 80.0 - 100.0 fL   MCH 19.3 (L) 26.0 - 34.0 pg   MCHC 29.9 (L) 30.0 - 36.0 g/dL   RDW 16.5 (H) 11.5 - 15.5 %   Platelets 273 150 - 400 K/uL   nRBC 0.0 0.0 - 0.2 %  Troponin I (High Sensitivity)     Status: None   Collection Time: 09/30/22  9:18 PM  Result Value Ref Range   Troponin I (High Sensitivity) <2 <18 ng/L   Imaging Studies: DG Chest 2 View  Result Date: 09/30/2022 CLINICAL DATA:  Chest pain. EXAM: CHEST - 2 VIEW COMPARISON:  September 20, 2022 FINDINGS: The heart size and mediastinal  contours are within normal limits. Both lungs are clear. Radiopaque surgical clips are seen within the abdomen on the lateral view. The visualized skeletal structures are unremarkable. IMPRESSION: No active cardiopulmonary disease. Electronically Signed   By: Virgina Norfolk M.D.   On: 09/30/2022 21:43    ED COURSE and MDM  Nursing notes, initial and subsequent vitals signs, including pulse oximetry, reviewed and interpreted by myself.  Vitals:   10/01/22 0135 10/01/22 0150 10/01/22 0215 10/01/22 0230  BP: 113/79 114/72 108/76 97/62  Pulse: 88 88 83 88  Resp: 18 16 20 18   Temp:      TempSrc:      SpO2: 100% 98% 99% 96%  Weight:      Height:       Medications  nitroGLYCERIN (NITROSTAT) SL tablet 0.4 mg (0.4 mg Sublingual Given 10/01/22 0224)    2:13 AM Transient improvement in chest pain chest pain with sublingual nitroglycerin but pain returned.   2:25 AM Second troponin (did not cross over into this note) is <2.  No EKG or troponin evidence of myocardial damage.   2:59 AM Chest pain nearly resolved after second sublingual nitroglycerin.  Patient would like to be discharged home.  She does not wish to be admitted.  She was advised to contact her cardiologist later this morning.  As noted above she has been having similar episodes of chest pain for several years.  She had a reassuring cardiac catheterization 3 years ago.  She is showing no evidence of cardiac ischemia at this time.  PROCEDURES  Procedures   ED DIAGNOSES   Chest pain at rest       Crystal Myers, Jenny Reichmann, MD 10/01/22 [phone]

## 2023-01-12 ENCOUNTER — Emergency Department (HOSPITAL_BASED_OUTPATIENT_CLINIC_OR_DEPARTMENT_OTHER): Payer: BC Managed Care – PPO

## 2023-01-12 ENCOUNTER — Other Ambulatory Visit: Payer: Self-pay

## 2023-01-12 ENCOUNTER — Encounter (HOSPITAL_BASED_OUTPATIENT_CLINIC_OR_DEPARTMENT_OTHER): Payer: Self-pay

## 2023-01-12 ENCOUNTER — Emergency Department (HOSPITAL_BASED_OUTPATIENT_CLINIC_OR_DEPARTMENT_OTHER)
Admission: EM | Admit: 2023-01-12 | Discharge: 2023-01-13 | Disposition: A | Discharge status: Home / Self Care | Payer: BC Managed Care – PPO | Attending: Emergency Medicine | Admitting: Emergency Medicine

## 2023-01-12 DIAGNOSIS — R519 Headache, unspecified: Secondary | ICD-10-CM

## 2023-01-12 DIAGNOSIS — J45909 Unspecified asthma, uncomplicated: Secondary | ICD-10-CM | POA: Insufficient documentation

## 2023-01-12 DIAGNOSIS — R200 Anesthesia of skin: Secondary | ICD-10-CM | POA: Diagnosis not present

## 2023-01-12 DIAGNOSIS — Z79899 Other long term (current) drug therapy: Secondary | ICD-10-CM | POA: Insufficient documentation

## 2023-01-12 DIAGNOSIS — I10 Essential (primary) hypertension: Secondary | ICD-10-CM | POA: Insufficient documentation

## 2023-01-12 DIAGNOSIS — J019 Acute sinusitis, unspecified: Secondary | ICD-10-CM

## 2023-01-12 LAB — CBC WITH DIFFERENTIAL/PLATELET
Abs Immature Granulocytes: 0.01 10*3/uL (ref 0.00–0.07)
Basophils Absolute: 0.1 10*3/uL (ref 0.0–0.1)
Basophils Relative: 1 %
Eosinophils Absolute: 0.5 10*3/uL (ref 0.0–0.5)
Eosinophils Relative: 6 %
HCT: 33.4 % — ABNORMAL LOW (ref 36.0–46.0)
Hemoglobin: 10.5 g/dL — ABNORMAL LOW (ref 12.0–15.0)
Immature Granulocytes: 0 %
Lymphocytes Relative: 39 %
Lymphs Abs: 2.9 10*3/uL (ref 0.7–4.0)
MCH: 19.7 pg — ABNORMAL LOW (ref 26.0–34.0)
MCHC: 31.4 g/dL (ref 30.0–36.0)
MCV: 62.5 fL — ABNORMAL LOW (ref 80.0–100.0)
Monocytes Absolute: 0.4 10*3/uL (ref 0.1–1.0)
Monocytes Relative: 5 %
Neutro Abs: 3.6 10*3/uL (ref 1.7–7.7)
Neutrophils Relative %: 49 %
Platelets: 259 10*3/uL (ref 150–400)
RBC: 5.34 MIL/uL — ABNORMAL HIGH (ref 3.87–5.11)
RDW: 16.2 % — ABNORMAL HIGH (ref 11.5–15.5)
WBC: 7.5 10*3/uL (ref 4.0–10.5)
nRBC: 0 % (ref 0.0–0.2)

## 2023-01-12 LAB — COMPREHENSIVE METABOLIC PANEL
ALT: 17 U/L (ref 0–44)
AST: 15 U/L (ref 15–41)
Albumin: 4.6 g/dL (ref 3.5–5.0)
Alkaline Phosphatase: 55 U/L (ref 38–126)
Anion gap: 8 (ref 5–15)
BUN: 18 mg/dL (ref 6–20)
CO2: 28 mmol/L (ref 22–32)
Calcium: 9.5 mg/dL (ref 8.9–10.3)
Chloride: 104 mmol/L (ref 98–111)
Creatinine, Ser: 0.64 mg/dL (ref 0.44–1.00)
GFR, Estimated: >60 mL/min (ref >60)
Glucose, Bld: 93 mg/dL (ref 70–99)
Potassium: 4 mmol/L (ref 3.5–5.1)
Sodium: 140 mmol/L (ref 135–145)
Total Bilirubin: 0.7 mg/dL (ref 0.3–1.2)
Total Protein: 7 g/dL (ref 6.5–8.1)

## 2023-01-12 LAB — CBG MONITORING, ED: Glucose-Capillary: 106 mg/dL — ABNORMAL HIGH (ref 70–99)

## 2023-01-12 MED ORDER — DEXAMETHASONE SODIUM PHOSPHATE 10 MG/ML IJ SOLN
10.0000 mg | Freq: Once | INTRAMUSCULAR | Status: AC
  Administered 2023-01-12: 10 mg via INTRAVENOUS
  Filled 2023-01-12: qty 1

## 2023-01-12 MED ORDER — KETOROLAC TROMETHAMINE 30 MG/ML IJ SOLN
30.0000 mg | Freq: Once | INTRAMUSCULAR | Status: AC
  Administered 2023-01-12: 30 mg via INTRAVENOUS
  Filled 2023-01-12: qty 1

## 2023-01-12 MED ORDER — SODIUM CHLORIDE 0.9 % IV BOLUS
1000.0000 mL | Freq: Once | INTRAVENOUS | Status: AC
  Administered 2023-01-12: 1000 mL via INTRAVENOUS

## 2023-01-12 MED ORDER — METOCLOPRAMIDE HCL 5 MG/ML IJ SOLN
10.0000 mg | Freq: Once | INTRAMUSCULAR | Status: AC
  Administered 2023-01-12: 10 mg via INTRAVENOUS
  Filled 2023-01-12: qty 2

## 2023-01-12 MED ORDER — DIPHENHYDRAMINE HCL 50 MG/ML IJ SOLN
25.0000 mg | Freq: Once | INTRAMUSCULAR | Status: AC
  Administered 2023-01-12: 25 mg via INTRAVENOUS
  Filled 2023-01-12 (×2): qty 1

## 2023-01-12 NOTE — ED Triage Notes (Signed)
Pt states she woke up this AM w pain/ tingling around L eye, into L forehead & L side of head, stopping at crown, associated fogginess in L eye- "not really major, but it's there." Stroke screen clear in triage, no unilateral deficit noted.

## 2023-01-12 NOTE — ED Notes (Signed)
Patient back from ct

## 2023-01-12 NOTE — ED Provider Notes (Signed)
[place] Provider Note   CSN: [id number] Arrival date & time: 01/12/23  2048     History  Chief Complaint  Patient presents with   Migraine    Crystal Myers is a 42 y.o. female.  Patient is a 42 year old female with past medical history of migraines, ADHD, anxiety, asthma, hypertension, anemia.  Patient presenting today with complaints of left-sided facial numbness.  She woke up this morning with numbness around her left eye that since spread to her ear and down her cheek and up into her forehead.  She denies any weakness.  She does describe her vision from the left eye as "being in a fog".  She denies any flashing lights or visual deficits otherwise.  She denies any weakness or numbness of the arms.  She has history of a "complicated migraine" that caused her to lose strength in her left arm and left leg.  This occurred a number of years ago.  The history is provided by the patient.       Home Medications Prior to Admission medications   Medication Sig Start Date End Date Taking? Authorizing Provider  albuterol (PROAIR HFA) 108 (90 Base) MCG/ACT inhaler Inhale 2 puffs into the lungs every 4 (four) hours as needed for wheezing or shortness of breath. 10/01/22   Molpus, John, MD  atorvastatin (LIPITOR) 40 MG tablet Take 1 tablet (40 mg total) by mouth daily at 6 PM. 07/19/19   Ruthann Cancer, Jesse Fall, PA-C  chlorthalidone (HYGROTON) 25 MG tablet Take 25 mg by mouth daily. 11/12/19   [provider]  fluticasone (FLONASE) 50 MCG/ACT nasal spray Place 1 spray into both nostrils daily. 03/02/22   Blue, Soijett A, PA-C  gabapentin (NEURONTIN) 300 MG capsule Take 1 capsule (300 mg total) by mouth 3 (three) times daily for 14 days. 03/02/22 03/16/22  Blue, Soijett A, PA-C  gemfibrozil (LOPID) 600 MG tablet Take 600 mg by mouth 2 (two) times daily. 10/11/19   [provider]  HYDROcodone-acetaminophen (NORCO/VICODIN) 5-325 MG  tablet Take 1 tablet by mouth every 6 (six) hours as needed. 10/31/20   Drenda Freeze, MD  hydrOXYzine (ATARAX/VISTARIL) 25 MG tablet Take 1 tablet (25 mg total) by mouth every 6 (six) hours. 10/08/21   Doristine Devoid, PA-C  ipratropium-albuterol (DUONEB) 0.5-2.5 (3) MG/3ML SOLN Take 3 mLs by nebulization every 2 (two) hours as needed. 10/03/15   Barrett, Lahoma Crocker, PA-C  Levonorgestrel-Ethinyl Estradiol (CAMRESE) 0.15-0.03 &0.01 MG tablet Take 1 tablet by mouth daily. 05/14/18   Constant, Peggy, MD  metoprolol succinate (TOPROL-XL) 25 MG 24 hr tablet Take 1 tablet (25 mg total) by mouth daily. 07/19/19   Recardo Evangelist, PA-C  mometasone-formoterol (DULERA) 200-5 MCG/ACT AERO Inhale 2 puffs into the lungs 2 (two) times daily. 08/19/18   Rigoberto Noel, MD  ondansetron (ZOFRAN ODT) 4 MG disintegrating tablet 64m ODT q4 hours prn nausea/vomit 10/31/20   YDrenda Freeze MD  potassium chloride SA (KLOR-CON) 20 MEQ tablet Take 2 tablets (40 mEq total) by mouth 2 (two) times daily for 5 days. 12/04/19 12/09/19  GSherwood Gambler MD  sucralfate (CARAFATE) 1 GM/10ML suspension Take 10 mLs (1 g total) by mouth 4 (four) times daily -  with meals and at bedtime. 04/22/20   CFatima Blank MD  traZODone (DESYREL) 50 MG tablet Take 50 mg by mouth at bedtime as needed for sleep.    [provider]      Allergies  Ferrous gluconate, Adhesive [tape], Penicillins, Dust mite mixed allergen ext [mite (d. farinae)], Other, Oxycodone, Varenicline, Amoxapine, and Augmentin [amoxicillin-pot clavulanate]    Review of Systems   Review of Systems  All other systems reviewed and are negative.   Physical Exam Updated Vital Signs BP 107/74   Pulse 77   Temp 98.1 F (36.7 C) (Oral)   Resp 18   Ht 5' 8"$  (1.727 m)   Wt 103 kg   SpO2 100%   BMI 34.52 kg/m  Physical Exam Vitals and nursing note reviewed.  Constitutional:      General: She is not in acute distress.    Appearance: She is  well-developed. She is not diaphoretic.  HENT:     Head: Normocephalic and atraumatic.  Eyes:     Extraocular Movements: Extraocular movements intact.     Pupils: Pupils are equal, round, and reactive to light.  Cardiovascular:     Rate and Rhythm: Normal rate and regular rhythm.     Heart sounds: No murmur heard.    No friction rub. No gallop.  Pulmonary:     Effort: Pulmonary effort is normal. No respiratory distress.     Breath sounds: Normal breath sounds. No wheezing.  Abdominal:     General: Bowel sounds are normal. There is no distension.     Palpations: Abdomen is soft.     Tenderness: There is no abdominal tenderness.  Musculoskeletal:        General: Normal range of motion.     Cervical back: Normal range of motion and neck supple.  Skin:    General: Skin is warm and dry.  Neurological:     General: No focal deficit present.     Mental Status: She is alert and oriented to person, place, and time. Mental status is at baseline.     Cranial Nerves: No cranial nerve deficit.     Motor: No weakness.     Coordination: Coordination normal.     ED Results / Procedures / Treatments   Labs (all labs ordered are listed, but only abnormal results are displayed) Labs Reviewed  CBC WITH DIFFERENTIAL/PLATELET - Abnormal; Notable for the following components:      Result Value   RBC 5.34 (*)    Hemoglobin 10.5 (*)    HCT 33.4 (*)    MCV 62.5 (*)    MCH 19.7 (*)    RDW 16.2 (*)    All other components within normal limits  CBG MONITORING, ED - Abnormal; Notable for the following components:   Glucose-Capillary 106 (*)    All other components within normal limits  COMPREHENSIVE METABOLIC PANEL    EKG None  Radiology No results found.  Procedures Procedures  {Document cardiac monitor, telemetry assessment procedure when appropriate:1}  Medications Ordered in ED Medications  ketorolac (TORADOL) 30 MG/ML injection 30 mg (has no administration in time range)   dexamethasone (DECADRON) injection 10 mg (has no administration in time range)  diphenhydrAMINE (BENADRYL) injection 25 mg (has no administration in time range)  metoCLOPramide (REGLAN) injection 10 mg (has no administration in time range)  sodium chloride 0.9 % bolus 1,000 mL (has no administration in time range)    ED Course/ Medical Decision Making/ A&P   {   Click here for ABCD2, HEART and other calculatorsREFRESH Note before signing :1}                          Medical Decision  Making Amount and/or Complexity of Data Reviewed Labs: ordered. Radiology: ordered.  Risk Prescription drug management.   ***  {Document critical care time when appropriate:1} {Document review of labs and clinical decision tools ie heart score, Chads2Vasc2 etc:1}  {Document your independent review of radiology images, and any outside records:1} {Document your discussion with family members, caretakers, and with consultants:1} {Document social determinants of health affecting pt's care:1} {Document your decision making why or why not admission, treatments were needed:1} Final Clinical Impression(s) / ED Diagnoses Final diagnoses:  None    Rx / DC Orders ED Discharge Orders     None

## 2023-01-13 ENCOUNTER — Telehealth (HOSPITAL_BASED_OUTPATIENT_CLINIC_OR_DEPARTMENT_OTHER): Payer: Self-pay | Admitting: Emergency Medicine

## 2023-01-13 DIAGNOSIS — J019 Acute sinusitis, unspecified: Secondary | ICD-10-CM

## 2023-01-13 MED ORDER — DOXYCYCLINE HYCLATE 100 MG PO CAPS: 100.0000 mg | ORAL_CAPSULE | Freq: Two times a day (BID) | ORAL | 0 refills | Status: AC

## 2023-01-13 MED ORDER — DOXYCYCLINE HYCLATE 100 MG PO TABS
100.0000 mg | ORAL_TABLET | Freq: Once | ORAL | Status: AC
  Administered 2023-01-13: 100 mg via ORAL
  Filled 2023-01-13: qty 1

## 2023-01-13 NOTE — Discharge Instructions (Signed)
Begin taking doxycycline as prescribed.  Rotate Tylenol 1000 mg with ibuprofen 600 mg every 4 hours as needed for pain.  Follow-up with primary doctor if not improving in the next few days, and return to the ER if symptoms significantly worsen or change.

## 2023-01-13 NOTE — Telephone Encounter (Signed)
Patient forgot her AVS and printed doxycycline prescription.  Requesting re-print of her prescription.

## 2023-06-29 ENCOUNTER — Encounter (HOSPITAL_BASED_OUTPATIENT_CLINIC_OR_DEPARTMENT_OTHER): Payer: Self-pay

## 2023-06-29 ENCOUNTER — Emergency Department (HOSPITAL_BASED_OUTPATIENT_CLINIC_OR_DEPARTMENT_OTHER)
Admission: EM | Admit: 2023-06-29 | Discharge: 2023-06-29 | Disposition: A | Discharge status: Home / Self Care | Payer: BC Managed Care – PPO | Source: Home / Self Care | Attending: Emergency Medicine | Admitting: Emergency Medicine

## 2023-06-29 ENCOUNTER — Other Ambulatory Visit: Payer: Self-pay

## 2023-06-29 DIAGNOSIS — W260XXA Contact with knife, initial encounter: Secondary | ICD-10-CM | POA: Insufficient documentation

## 2023-06-29 DIAGNOSIS — Z23 Encounter for immunization: Secondary | ICD-10-CM | POA: Insufficient documentation

## 2023-06-29 DIAGNOSIS — S61211A Laceration without foreign body of left index finger without damage to nail, initial encounter: Secondary | ICD-10-CM | POA: Diagnosis present

## 2023-06-29 MED ORDER — LIDOCAINE HCL (PF) 1 % IJ SOLN
5.0000 mL | Freq: Once | INTRAMUSCULAR | Status: AC
  Administered 2023-06-29: 5 mL
  Filled 2023-06-29: qty 5

## 2023-06-29 MED ORDER — TETANUS-DIPHTH-ACELL PERTUSSIS 5-2.5-18.5 LF-MCG/0.5 IM SUSY
0.5000 mL | PREFILLED_SYRINGE | Freq: Once | INTRAMUSCULAR | Status: AC
  Administered 2023-06-29: 0.5 mL via INTRAMUSCULAR
  Filled 2023-06-29: qty 0.5
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
## 2023-06-29 NOTE — Discharge Instructions (Signed)
The stitches can come out in about a week to 10 days.  They can come out by primary care doctor or even urgent care.  You can follow-up with Dr. Dion Saucier if needed for further evaluation of the numbness.  Watch for signs of infection.
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
## 2023-07-11 ENCOUNTER — Emergency Department (HOSPITAL_BASED_OUTPATIENT_CLINIC_OR_DEPARTMENT_OTHER)
Admission: EM | Admit: 2023-07-11 | Discharge: 2023-07-11 | Disposition: A | Discharge status: Home / Self Care | Payer: BC Managed Care – PPO | Attending: Emergency Medicine | Admitting: Emergency Medicine

## 2023-07-11 ENCOUNTER — Other Ambulatory Visit: Payer: Self-pay

## 2023-07-11 ENCOUNTER — Encounter (HOSPITAL_BASED_OUTPATIENT_CLINIC_OR_DEPARTMENT_OTHER): Payer: Self-pay | Admitting: Emergency Medicine

## 2023-07-11 DIAGNOSIS — Z4802 Encounter for removal of sutures: Secondary | ICD-10-CM | POA: Insufficient documentation

## 2023-07-11 DIAGNOSIS — Z79899 Other long term (current) drug therapy: Secondary | ICD-10-CM | POA: Insufficient documentation

## 2023-07-11 DIAGNOSIS — Z87891 Personal history of nicotine dependence: Secondary | ICD-10-CM | POA: Diagnosis not present

## 2023-07-11 DIAGNOSIS — W260XXD Contact with knife, subsequent encounter: Secondary | ICD-10-CM | POA: Insufficient documentation

## 2023-07-11 DIAGNOSIS — I10 Essential (primary) hypertension: Secondary | ICD-10-CM | POA: Insufficient documentation

## 2023-07-11 DIAGNOSIS — S61211D Laceration without foreign body of left index finger without damage to nail, subsequent encounter: Secondary | ICD-10-CM | POA: Diagnosis not present

## 2023-07-11 NOTE — ED Triage Notes (Signed)
Pt presents to ED POV. Pt here for suture removal. Denies s/s of infection.

## 2023-07-11 NOTE — Discharge Instructions (Signed)
As discussed, workup today overall reassuring.  Repaired wound looks great.  Continue take about Tylenol/Motrin as needed for pain otherwise, treated area as normal skin.  Please do not hesitate to return to the emergency department if the worrisome signs and symptoms we discussed become apparent.

## 2023-07-11 NOTE — ED Notes (Signed)
Discharge instructions discussed with pt. Pt verbalized understanding. Pt stable and ambulatory.  °
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
## 2023-07-31 NOTE — Progress Notes (Signed)
This encounter was created in error - please disregard.
